# Patient Record
Sex: Male | Born: 1935 | Race: White | Hispanic: No | Marital: Married | Smoking: Former smoker
Health system: Southern US, Community
[De-identification: ages and names within clinical notes are randomized; demographics above are authoritative.]

## PROBLEM LIST (undated history)

## (undated) DIAGNOSIS — Z86018 Personal history of other benign neoplasm: Secondary | ICD-10-CM

## (undated) DIAGNOSIS — H9190 Unspecified hearing loss, unspecified ear: Secondary | ICD-10-CM

## (undated) DIAGNOSIS — N400 Enlarged prostate without lower urinary tract symptoms: Secondary | ICD-10-CM

## (undated) DIAGNOSIS — Z85820 Personal history of malignant melanoma of skin: Secondary | ICD-10-CM

## (undated) DIAGNOSIS — E785 Hyperlipidemia, unspecified: Secondary | ICD-10-CM

## (undated) DIAGNOSIS — M1990 Unspecified osteoarthritis, unspecified site: Secondary | ICD-10-CM

## (undated) DIAGNOSIS — T7840XA Allergy, unspecified, initial encounter: Secondary | ICD-10-CM

## (undated) DIAGNOSIS — Z85828 Personal history of other malignant neoplasm of skin: Secondary | ICD-10-CM

## (undated) DIAGNOSIS — I10 Essential (primary) hypertension: Secondary | ICD-10-CM

## (undated) DIAGNOSIS — R011 Cardiac murmur, unspecified: Secondary | ICD-10-CM

## (undated) HISTORY — PX: PROSTATE SURGERY: SHX751

## (undated) HISTORY — DX: Benign prostatic hyperplasia without lower urinary tract symptoms: N40.0

## (undated) HISTORY — PX: EYE SURGERY: SHX253

## (undated) HISTORY — DX: Personal history of other benign neoplasm: Z86.018

## (undated) HISTORY — PX: CHOLECYSTECTOMY: SHX55

## (undated) HISTORY — PX: APPENDECTOMY: SHX54

## (undated) HISTORY — PX: VASECTOMY: SHX75

## (undated) HISTORY — DX: Allergy, unspecified, initial encounter: T78.40XA

## (undated) HISTORY — PX: FRACTURE SURGERY: SHX138

## (undated) HISTORY — DX: Personal history of other malignant neoplasm of skin: Z85.828

## (undated) HISTORY — PX: HERNIA REPAIR: SHX51

## (undated) HISTORY — PX: OTHER SURGICAL HISTORY: SHX169

## (undated) HISTORY — DX: Personal history of malignant melanoma of skin: Z85.820

---

## 1994-10-22 HISTORY — PX: OTHER SURGICAL HISTORY: SHX169

## 2004-10-26 ENCOUNTER — Ambulatory Visit: Payer: Self-pay | Admitting: Internal Medicine

## 2012-04-17 ENCOUNTER — Ambulatory Visit: Payer: Self-pay | Admitting: Ophthalmology

## 2012-04-30 ENCOUNTER — Ambulatory Visit: Payer: Self-pay | Admitting: Ophthalmology

## 2013-01-14 DIAGNOSIS — Z85820 Personal history of malignant melanoma of skin: Secondary | ICD-10-CM

## 2013-01-14 DIAGNOSIS — C439 Malignant melanoma of skin, unspecified: Secondary | ICD-10-CM

## 2013-01-14 HISTORY — DX: Malignant melanoma of skin, unspecified: C43.9

## 2013-01-14 HISTORY — DX: Personal history of malignant melanoma of skin: Z85.820

## 2013-02-18 DIAGNOSIS — Z85828 Personal history of other malignant neoplasm of skin: Secondary | ICD-10-CM

## 2013-02-18 HISTORY — DX: Personal history of other malignant neoplasm of skin: Z85.828

## 2013-02-25 DIAGNOSIS — C4491 Basal cell carcinoma of skin, unspecified: Secondary | ICD-10-CM

## 2013-02-25 HISTORY — DX: Basal cell carcinoma of skin, unspecified: C44.91

## 2013-09-14 DIAGNOSIS — D239 Other benign neoplasm of skin, unspecified: Secondary | ICD-10-CM

## 2013-09-14 HISTORY — DX: Other benign neoplasm of skin, unspecified: D23.9

## 2014-12-22 DIAGNOSIS — L578 Other skin changes due to chronic exposure to nonionizing radiation: Secondary | ICD-10-CM | POA: Diagnosis not present

## 2014-12-22 DIAGNOSIS — D229 Melanocytic nevi, unspecified: Secondary | ICD-10-CM | POA: Diagnosis not present

## 2014-12-22 DIAGNOSIS — L821 Other seborrheic keratosis: Secondary | ICD-10-CM | POA: Diagnosis not present

## 2014-12-22 DIAGNOSIS — L570 Actinic keratosis: Secondary | ICD-10-CM | POA: Diagnosis not present

## 2014-12-22 DIAGNOSIS — L720 Epidermal cyst: Secondary | ICD-10-CM | POA: Diagnosis not present

## 2014-12-22 DIAGNOSIS — L814 Other melanin hyperpigmentation: Secondary | ICD-10-CM | POA: Diagnosis not present

## 2014-12-22 DIAGNOSIS — D1800 Hemangioma unspecified site: Secondary | ICD-10-CM | POA: Diagnosis not present

## 2015-02-13 NOTE — Op Note (Signed)
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  PROCEDURE:  Phacoemulsification with posterior chamber intraocular lens placement which required pupil stretching with the Graether pupil expansion device.   LENS IMPLANT:  ZCB00 22.5-diopter.   ULTRASOUND TIME:   11% of 2 minute and 12 seconds for CDE of 14.6.  SURGEON:  Mali Sotiria Keast, MD  ANESTHESIA:  Retrobulbar block of Xylocaine and Bupivacaine and Vitrase.   COMPLICATIONS:  None.  DESCRIPTION OF PROCEDURE:  The patient was identified in the holding room, transported to the operating room, and placed in the supine position.  The left eye was identified as the operative eye and a retrobulbar block was administered under intravenous sedation.  It was then prepped and draped in the usual sterile ophthalmic fashion.  The eye was inspected and the pupil was noted to be between 4 millimeters with maximal pharmacologic dilation.  A 1 millimeter side-port incision was made at the 1:30 position.  The anterior chamber was filled with Viscoat.  A 2.4 millimeter near-clear corneal incision was made at the 10:30 position.  A Graether pupil expander was then placed through the main incision and into the anterior chamber of the eye.  The edge of the iris was secured on the lip of the pupil expander and it was released, thereby expanding the pupil to approximately 7 millimeters for completion of the cataract surgery.  A cystotome and capsulorrhexis forceps were used to make a curvilinear capsulorrhexis.   Balanced salt solution was used to hydrodissect and hydrodelineate the lens nucleus.  Phacoemulsification was used in stop and chop fashion to remove the lens, nucleus and epinucleus.  The remaining cortex was aspirated using the irrigation aspiration  handpiece.  Additional Provisc was placed into the eye to distend the capsular bag for lens placement.  A ZCB00 22.5-diopter lens was then injected into the capsular bag.  The remaining viscoelastic was aspirated from the capsular bag and the anterior chamber. The pupil expanding ring was removed using a Kuglen hook.  The anterior chamber was filled with balanced salt solution to inflate to a physiologic pressure.    The wounds were hydrated with balanced salt solution.  The eye was noted to have a physiologic pressure without wound leak.  Topical Vigamox drops and Maxitrol ointment were applied to the eye.  The eye was shielded.   The patient was taken to the recovery room in stable condition.  ____________________________ Wyonia Hough, MD crb:slb D: 04/30/2012 14:05:52 ET T: 04/30/2012 14:31:01 ET JOB#: [id number]  cc: Wyonia Hough, MD, <Dictator> Leandrew Koyanagi MD ELECTRONICALLY SIGNED 04/30/2012 16:30

## 2015-02-16 DIAGNOSIS — H903 Sensorineural hearing loss, bilateral: Secondary | ICD-10-CM | POA: Diagnosis not present

## 2015-03-25 DIAGNOSIS — Z0000 Encounter for general adult medical examination without abnormal findings: Secondary | ICD-10-CM | POA: Diagnosis not present

## 2015-03-25 DIAGNOSIS — E784 Other hyperlipidemia: Secondary | ICD-10-CM | POA: Diagnosis not present

## 2015-05-16 DIAGNOSIS — E784 Other hyperlipidemia: Secondary | ICD-10-CM | POA: Diagnosis not present

## 2015-05-17 DIAGNOSIS — I10 Essential (primary) hypertension: Secondary | ICD-10-CM | POA: Diagnosis not present

## 2015-05-17 DIAGNOSIS — R972 Elevated prostate specific antigen [PSA]: Secondary | ICD-10-CM | POA: Diagnosis not present

## 2015-05-17 DIAGNOSIS — E784 Other hyperlipidemia: Secondary | ICD-10-CM | POA: Diagnosis not present

## 2015-05-25 DIAGNOSIS — Z0000 Encounter for general adult medical examination without abnormal findings: Secondary | ICD-10-CM | POA: Diagnosis not present

## 2015-06-17 DIAGNOSIS — E785 Hyperlipidemia, unspecified: Secondary | ICD-10-CM | POA: Insufficient documentation

## 2015-06-17 DIAGNOSIS — Z1211 Encounter for screening for malignant neoplasm of colon: Secondary | ICD-10-CM | POA: Diagnosis not present

## 2015-06-17 DIAGNOSIS — I10 Essential (primary) hypertension: Secondary | ICD-10-CM | POA: Insufficient documentation

## 2015-06-22 DIAGNOSIS — L821 Other seborrheic keratosis: Secondary | ICD-10-CM | POA: Diagnosis not present

## 2015-06-22 DIAGNOSIS — D229 Melanocytic nevi, unspecified: Secondary | ICD-10-CM | POA: Diagnosis not present

## 2015-06-22 DIAGNOSIS — Z1283 Encounter for screening for malignant neoplasm of skin: Secondary | ICD-10-CM | POA: Diagnosis not present

## 2015-06-22 DIAGNOSIS — L570 Actinic keratosis: Secondary | ICD-10-CM | POA: Diagnosis not present

## 2015-06-22 DIAGNOSIS — Z85828 Personal history of other malignant neoplasm of skin: Secondary | ICD-10-CM | POA: Diagnosis not present

## 2015-06-22 DIAGNOSIS — L720 Epidermal cyst: Secondary | ICD-10-CM | POA: Diagnosis not present

## 2015-06-22 DIAGNOSIS — Z85820 Personal history of malignant melanoma of skin: Secondary | ICD-10-CM | POA: Diagnosis not present

## 2015-06-22 DIAGNOSIS — D485 Neoplasm of uncertain behavior of skin: Secondary | ICD-10-CM | POA: Diagnosis not present

## 2015-07-15 ENCOUNTER — Ambulatory Visit: Payer: Commercial Managed Care - HMO | Admitting: Anesthesiology

## 2015-07-15 ENCOUNTER — Ambulatory Visit
Admission: RE | Admit: 2015-07-15 | Discharge: 2015-07-15 | Disposition: A | Discharge status: Home / Self Care | Payer: Commercial Managed Care - HMO | Source: Ambulatory Visit | Attending: Gastroenterology | Admitting: Gastroenterology

## 2015-07-15 ENCOUNTER — Encounter: Payer: Self-pay | Admitting: Anesthesiology

## 2015-07-15 ENCOUNTER — Encounter
Admission: RE | Disposition: A | Discharge status: Home / Self Care | Payer: Self-pay | Source: Ambulatory Visit | Attending: Gastroenterology

## 2015-07-15 DIAGNOSIS — K640 First degree hemorrhoids: Secondary | ICD-10-CM | POA: Insufficient documentation

## 2015-07-15 DIAGNOSIS — K635 Polyp of colon: Secondary | ICD-10-CM | POA: Diagnosis not present

## 2015-07-15 DIAGNOSIS — Z87891 Personal history of nicotine dependence: Secondary | ICD-10-CM | POA: Insufficient documentation

## 2015-07-15 DIAGNOSIS — D120 Benign neoplasm of cecum: Secondary | ICD-10-CM | POA: Diagnosis not present

## 2015-07-15 DIAGNOSIS — D125 Benign neoplasm of sigmoid colon: Secondary | ICD-10-CM | POA: Diagnosis not present

## 2015-07-15 DIAGNOSIS — I10 Essential (primary) hypertension: Secondary | ICD-10-CM | POA: Insufficient documentation

## 2015-07-15 DIAGNOSIS — Z79899 Other long term (current) drug therapy: Secondary | ICD-10-CM | POA: Insufficient documentation

## 2015-07-15 DIAGNOSIS — K5730 Diverticulosis of large intestine without perforation or abscess without bleeding: Secondary | ICD-10-CM | POA: Diagnosis not present

## 2015-07-15 DIAGNOSIS — E785 Hyperlipidemia, unspecified: Secondary | ICD-10-CM | POA: Insufficient documentation

## 2015-07-15 DIAGNOSIS — Z86010 Personal history of colonic polyps: Secondary | ICD-10-CM | POA: Diagnosis not present

## 2015-07-15 DIAGNOSIS — K5790 Diverticulosis of intestine, part unspecified, without perforation or abscess without bleeding: Secondary | ICD-10-CM | POA: Diagnosis not present

## 2015-07-15 DIAGNOSIS — D122 Benign neoplasm of ascending colon: Secondary | ICD-10-CM | POA: Diagnosis not present

## 2015-07-15 DIAGNOSIS — Z1211 Encounter for screening for malignant neoplasm of colon: Secondary | ICD-10-CM | POA: Diagnosis not present

## 2015-07-15 HISTORY — PX: COLONOSCOPY WITH PROPOFOL: SHX5780

## 2015-07-15 HISTORY — DX: Hyperlipidemia, unspecified: E78.5

## 2015-07-15 HISTORY — DX: Essential (primary) hypertension: I10

## 2015-07-15 SURGERY — COLONOSCOPY WITH PROPOFOL
Anesthesia: General

## 2015-07-15 MED ORDER — PROPOFOL 10 MG/ML IV BOLUS
INTRAVENOUS | Status: DC | PRN
  Administered 2015-07-15 (×2): 30 mg via INTRAVENOUS
  Administered 2015-07-15 (×2): 20 mg via INTRAVENOUS

## 2015-07-15 MED ORDER — SODIUM CHLORIDE 0.9 % IV SOLN: INTRAVENOUS | Status: DC

## 2015-07-15 MED ORDER — PROPOFOL 500 MG/50ML IV EMUL
INTRAVENOUS | Status: DC | PRN
  Administered 2015-07-15: 100 ug/kg/min via INTRAVENOUS

## 2015-07-15 MED ORDER — SODIUM CHLORIDE 0.9 % IV SOLN
INTRAVENOUS | Status: DC
  Administered 2015-07-15: 1000 mL via INTRAVENOUS

## 2015-07-15 MED ORDER — LIDOCAINE HCL (CARDIAC) 20 MG/ML IV SOLN
INTRAVENOUS | Status: DC | PRN
  Administered 2015-07-15: 60 mg via INTRAVENOUS

## 2015-07-15 NOTE — H&P (Signed)
  Primary Care Physician:  Cletis Athens, MD  Pre-Procedure History & Physical: HPI:  Mathew Martinez is a 81 y.o. male is here for an colonoscopy.   Past Medical History  Diagnosis Date  . Hypertension   . Hyperlipemia     Past Surgical History  Procedure Laterality Date  . Appendectomy    . Cholecystectomy    . Hernia repair    . Duodenojejunostomy      Prior to Admission medications   Medication Sig Start Date End Date Taking? Authorizing Provider  ferrous sulfate 325 (65 FE) MG tablet Take 325 mg by mouth daily with breakfast.   Yes Historical Provider, MD  lisinopril (PRINIVIL,ZESTRIL) 40 MG tablet Take 40 mg by mouth daily.   Yes Historical Provider, MD  lovastatin (MEVACOR) 20 MG tablet Take 20 mg by mouth at bedtime.   Yes Historical Provider, MD  metoprolol succinate (TOPROL-XL) 25 MG 24 hr tablet Take 25 mg by mouth daily.   Yes Historical Provider, MD  Multiple Vitamins-Minerals (MULTIVITAMIN WITH MINERALS) tablet Take 1 tablet by mouth daily.   Yes Historical Provider, MD    Allergies as of 06/20/2015  . (Not on File)    History reviewed. No pertinent family history.  Social History   Social History  . Marital Status: Married    Spouse Name: N/A  . Number of Children: N/A  . Years of Education: N/A   Occupational History  . Not on file.   Social History Main Topics  . Smoking status: Former Research scientist (life sciences)  . Smokeless tobacco: Not on file  . Alcohol Use: Not on file  . Drug Use: Not on file  . Sexual Activity: Not on file   Other Topics Concern  . Not on file   Social History Narrative     Physical Exam: BP 115/49 mmHg  Pulse 66  Temp(Src) 96.9 F (36.1 C) (Tympanic)  Resp 18  Ht 5\' 9"  (1.753 m)  Wt 63.504 kg (140 lb)  BMI 20.67 kg/m2  SpO2 97% General:   Alert,  pleasant and cooperative in NAD Head:  Normocephalic and atraumatic. Neck:  Supple; no masses or thyromegaly. Lungs:  Clear throughout to auscultation.    Heart:  Regular rate and  rhythm. Abdomen:  Soft, nontender and nondistended. Normal bowel sounds, without guarding, and without rebound.   Neurologic:  Alert and  oriented x4;  grossly normal neurologically.  Impression/Plan: Mathew Martinez is here for an colonoscopy to be performed for  surveillance  Risks, benefits, limitations, and alternatives regarding  colonoscopy have been reviewed with the patient.  Questions have been answered.  All parties agreeable.   Josefine Class, MD  07/15/2015, 9:34 AM

## 2015-07-15 NOTE — Discharge Instructions (Signed)

## 2015-07-15 NOTE — Transfer of Care (Signed)
Immediate Anesthesia Transfer of Care Note  Patient: Mathew Martinez  Procedure(s) Performed: Procedure(s): COLONOSCOPY WITH PROPOFOL (N/A)  Patient Location: PACU  Anesthesia Type:MAC  Level of Consciousness: awake  Airway & Oxygen Therapy: Patient Spontanous Breathing  Post-op Assessment: Report given to RN  Post vital signs: Reviewed  Last Vitals:  Filed Vitals:   07/15/15 1018  BP: 101/65  Pulse: 70  Temp: 36.2 C  Resp: 16    Complications: No apparent anesthesia complications

## 2015-07-15 NOTE — Anesthesia Preprocedure Evaluation (Signed)
Anesthesia Evaluation  Patient identified by MRN, date of birth, ID band Patient awake    Reviewed: Allergy & Precautions, H&P , NPO status , Patient's Chart, lab work & pertinent test results  Airway Mallampati: III  TM Distance: >3 FB Neck ROM: limited    Dental no notable dental hx. (+) Teeth Intact   Pulmonary former smoker,    Pulmonary exam normal breath sounds clear to auscultation       Cardiovascular Exercise Tolerance: Good hypertension, (-) Past MI Normal cardiovascular exam Rhythm:regular Rate:Normal     Neuro/Psych negative neurological ROS  negative psych ROS   GI/Hepatic negative GI ROS, Neg liver ROS, neg GERD  ,  Endo/Other  negative endocrine ROS  Renal/GU negative Renal ROS  negative genitourinary   Musculoskeletal   Abdominal   Peds  Hematology negative hematology ROS (+)   Anesthesia Other Findings Past Medical History:   Hypertension                                                 Hyperlipemia                                                 Signs and symptoms suggestive of sleep apnea    Reproductive/Obstetrics negative OB ROS                             Anesthesia Physical Anesthesia Plan  ASA: III  Anesthesia Plan: General   Post-op Pain Management:    Induction:   Airway Management Planned:   Additional Equipment:   Intra-op Plan:   Post-operative Plan:   Informed Consent: I have reviewed the patients History and Physical, chart, labs and discussed the procedure including the risks, benefits and alternatives for the proposed anesthesia with the patient or authorized representative who has indicated his/her understanding and acceptance.   Dental Advisory Given  Plan Discussed with: Anesthesiologist, CRNA and Surgeon  Anesthesia Plan Comments:         Anesthesia Quick Evaluation

## 2015-07-15 NOTE — Op Note (Signed)
[facility] Patient Name: Mathew Martinez Procedure Date: 07/15/2015 9:35 AM MRN: [id number] Account #: [id number] Date of Birth: [birth year] Admit Type: Outpatient Age: 81 Room: [facility] ENDO ROOM 1 Gender: Male Note Status: Finalized Procedure:         Colonoscopy Indications:       High risk colon cancer surveillance: Personal history of                     multiple (3 or more) adenomas, Last colonoscopy: 2011 Patient Profile:   This is an 81 year old male. Providers:         Gerrit Heck. Rayann Heman, MD Referring MD:      Cletis Athens, MD (Referring MD) Medicines:         Propofol per Anesthesia Complications:     No immediate complications. Procedure:         Pre-Anesthesia Assessment:                    - Prior to the procedure, a History and Physical was                     performed, and patient medications, allergies and                     sensitivities were reviewed. The patient's tolerance of                     previous anesthesia was reviewed.                    After obtaining informed consent, the colonoscope was                     passed under direct vision. Throughout the procedure, the                     patient's blood pressure, pulse, and oxygen saturations                     were monitored continuously. The Colonoscope was                     introduced through the anus and advanced to the the cecum,                     identified by appendiceal orifice and ileocecal valve. The                     colonoscopy was somewhat difficult due to multiple                     diverticula in the colon. The patient tolerated the                     procedure well. The quality of the bowel preparation was                     good. Findings:      The perianal and digital rectal examinations were normal.      A 7 mm polyp was found in the ascending colon. The polyp was flat. The       polyp was removed with a hot snare. Resection and retrieval were        complete.      A 6  mm polyp was found at the ileocecal valve. The polyp was sessile.       The polyp was removed with a hot snare. Resection and retrieval were       complete.      A 8 mm polyp was found in the ascending colon. The polyp was sessile.       The polyp was removed with a hot snare. Resection and retrieval were       complete.      Many large-mouthed diverticula were found in the sigmoid colon.      A few large-mouthed diverticula were found in the transverse colon and       in the ascending colon.      Internal hemorrhoids were found during retroflexion. The hemorrhoids       were Grade I (internal hemorrhoids that do not prolapse).      The exam was otherwise without abnormality. Impression:        - One 7 mm polyp in the ascending colon. Resected and                     retrieved.                    - One 6 mm polyp at the ileocecal valve. Resected and                     retrieved.                    - One 8 mm polyp in the ascending colon. Resected and                     retrieved.                    - Diverticulosis in the sigmoid colon.                    - Diverticulosis in the transverse colon and in the                     ascending colon.                    - Internal hemorrhoids.                    - The examination was otherwise normal. Recommendation:    - Observe patient in GI recovery unit.                    - High fiber diet.                    - Continue present medications.                    - Await pathology results.                    - Consider no further colonoscopies given advanced age.                    - Return to referring physician.                    - The findings and recommendations were discussed with the  patient.                    - The findings and recommendations were discussed with the                     patient's family. Procedure Code(s): --- Professional ---                    [phone], Colonoscopy,  flexible; with removal of tumor(s),                     polyp(s), or other lesion(s) by snare technique CPT copyright 2014 American Medical Association. All rights reserved. The codes documented in this report are preliminary and upon coder review may  be revised to meet current compliance requirements. Mellody Life, MD 07/15/2015 10:15:25 AM This report has been signed electronically. Number of Addenda: 0 Note Initiated On: 07/15/2015 9:35 AM Scope Withdrawal Time: 0 hours 17 minutes 13 seconds  Total Procedure Duration: 0 hours 27 minutes 6 seconds       [facility]

## 2015-07-15 NOTE — Anesthesia Postprocedure Evaluation (Signed)
  Anesthesia Post-op Note  Patient: Mathew Martinez  Procedure(s) Performed: Procedure(s): COLONOSCOPY WITH PROPOFOL (N/A)  Anesthesia type:General  Patient location: PACU  Post pain: Pain level controlled  Post assessment: Post-op Vital signs reviewed, Patient's Cardiovascular Status Stable, Respiratory Function Stable, Patent Airway and No signs of Nausea or vomiting  Post vital signs: Reviewed and stable  Last Vitals:  Filed Vitals:   07/15/15 1050  BP: 112/56  Pulse: 67  Temp:   Resp: 15    Level of consciousness: awake, alert  and patient cooperative  Complications: No apparent anesthesia complications

## 2015-07-17 ENCOUNTER — Encounter: Payer: Self-pay | Admitting: Gastroenterology

## 2015-07-18 LAB — SURGICAL PATHOLOGY

## 2015-08-02 DIAGNOSIS — D485 Neoplasm of uncertain behavior of skin: Secondary | ICD-10-CM | POA: Diagnosis not present

## 2015-08-02 DIAGNOSIS — L720 Epidermal cyst: Secondary | ICD-10-CM | POA: Diagnosis not present

## 2015-08-26 DIAGNOSIS — I10 Essential (primary) hypertension: Secondary | ICD-10-CM | POA: Diagnosis not present

## 2015-08-26 DIAGNOSIS — Z23 Encounter for immunization: Secondary | ICD-10-CM | POA: Diagnosis not present

## 2015-08-26 DIAGNOSIS — R69 Illness, unspecified: Secondary | ICD-10-CM | POA: Diagnosis not present

## 2015-08-26 DIAGNOSIS — L578 Other skin changes due to chronic exposure to nonionizing radiation: Secondary | ICD-10-CM | POA: Diagnosis not present

## 2015-08-26 DIAGNOSIS — E784 Other hyperlipidemia: Secondary | ICD-10-CM | POA: Diagnosis not present

## 2015-08-26 DIAGNOSIS — I059 Rheumatic mitral valve disease, unspecified: Secondary | ICD-10-CM | POA: Diagnosis not present

## 2015-11-25 DIAGNOSIS — H2511 Age-related nuclear cataract, right eye: Secondary | ICD-10-CM | POA: Diagnosis not present

## 2016-01-03 DIAGNOSIS — E784 Other hyperlipidemia: Secondary | ICD-10-CM | POA: Diagnosis not present

## 2016-01-03 DIAGNOSIS — A493 Mycoplasma infection, unspecified site: Secondary | ICD-10-CM | POA: Diagnosis not present

## 2016-01-03 DIAGNOSIS — J40 Bronchitis, not specified as acute or chronic: Secondary | ICD-10-CM | POA: Diagnosis not present

## 2016-01-03 DIAGNOSIS — I059 Rheumatic mitral valve disease, unspecified: Secondary | ICD-10-CM | POA: Diagnosis not present

## 2016-01-10 DIAGNOSIS — J218 Acute bronchiolitis due to other specified organisms: Secondary | ICD-10-CM | POA: Diagnosis not present

## 2016-01-10 DIAGNOSIS — I059 Rheumatic mitral valve disease, unspecified: Secondary | ICD-10-CM | POA: Diagnosis not present

## 2016-01-10 DIAGNOSIS — E784 Other hyperlipidemia: Secondary | ICD-10-CM | POA: Diagnosis not present

## 2016-01-10 DIAGNOSIS — B9789 Other viral agents as the cause of diseases classified elsewhere: Secondary | ICD-10-CM | POA: Diagnosis not present

## 2016-02-02 DIAGNOSIS — L820 Inflamed seborrheic keratosis: Secondary | ICD-10-CM | POA: Diagnosis not present

## 2016-02-02 DIAGNOSIS — Z85820 Personal history of malignant melanoma of skin: Secondary | ICD-10-CM | POA: Diagnosis not present

## 2016-02-02 DIAGNOSIS — D485 Neoplasm of uncertain behavior of skin: Secondary | ICD-10-CM | POA: Diagnosis not present

## 2016-02-02 DIAGNOSIS — L821 Other seborrheic keratosis: Secondary | ICD-10-CM | POA: Diagnosis not present

## 2016-02-02 DIAGNOSIS — D1800 Hemangioma unspecified site: Secondary | ICD-10-CM | POA: Diagnosis not present

## 2016-02-02 DIAGNOSIS — Z1283 Encounter for screening for malignant neoplasm of skin: Secondary | ICD-10-CM | POA: Diagnosis not present

## 2016-02-02 DIAGNOSIS — D229 Melanocytic nevi, unspecified: Secondary | ICD-10-CM | POA: Diagnosis not present

## 2016-02-02 DIAGNOSIS — L812 Freckles: Secondary | ICD-10-CM | POA: Diagnosis not present

## 2016-02-02 DIAGNOSIS — Z85828 Personal history of other malignant neoplasm of skin: Secondary | ICD-10-CM | POA: Diagnosis not present

## 2016-02-09 DIAGNOSIS — I10 Essential (primary) hypertension: Secondary | ICD-10-CM | POA: Diagnosis not present

## 2016-02-09 DIAGNOSIS — I059 Rheumatic mitral valve disease, unspecified: Secondary | ICD-10-CM | POA: Diagnosis not present

## 2016-02-09 DIAGNOSIS — E784 Other hyperlipidemia: Secondary | ICD-10-CM | POA: Diagnosis not present

## 2016-02-09 DIAGNOSIS — K5732 Diverticulitis of large intestine without perforation or abscess without bleeding: Secondary | ICD-10-CM | POA: Diagnosis not present

## 2016-05-18 DIAGNOSIS — I10 Essential (primary) hypertension: Secondary | ICD-10-CM | POA: Diagnosis not present

## 2016-05-18 DIAGNOSIS — Z125 Encounter for screening for malignant neoplasm of prostate: Secondary | ICD-10-CM | POA: Diagnosis not present

## 2016-05-18 DIAGNOSIS — E784 Other hyperlipidemia: Secondary | ICD-10-CM | POA: Diagnosis not present

## 2016-05-18 DIAGNOSIS — R5381 Other malaise: Secondary | ICD-10-CM | POA: Diagnosis not present

## 2016-05-25 DIAGNOSIS — I059 Rheumatic mitral valve disease, unspecified: Secondary | ICD-10-CM | POA: Diagnosis not present

## 2016-05-25 DIAGNOSIS — E784 Other hyperlipidemia: Secondary | ICD-10-CM | POA: Diagnosis not present

## 2016-05-25 DIAGNOSIS — I10 Essential (primary) hypertension: Secondary | ICD-10-CM | POA: Diagnosis not present

## 2016-08-06 DIAGNOSIS — L812 Freckles: Secondary | ICD-10-CM | POA: Diagnosis not present

## 2016-08-06 DIAGNOSIS — D229 Melanocytic nevi, unspecified: Secondary | ICD-10-CM | POA: Diagnosis not present

## 2016-08-06 DIAGNOSIS — Z85820 Personal history of malignant melanoma of skin: Secondary | ICD-10-CM | POA: Diagnosis not present

## 2016-08-06 DIAGNOSIS — D485 Neoplasm of uncertain behavior of skin: Secondary | ICD-10-CM | POA: Diagnosis not present

## 2016-08-06 DIAGNOSIS — D1800 Hemangioma unspecified site: Secondary | ICD-10-CM | POA: Diagnosis not present

## 2016-08-06 DIAGNOSIS — L578 Other skin changes due to chronic exposure to nonionizing radiation: Secondary | ICD-10-CM | POA: Diagnosis not present

## 2016-08-06 DIAGNOSIS — L821 Other seborrheic keratosis: Secondary | ICD-10-CM | POA: Diagnosis not present

## 2016-08-06 DIAGNOSIS — Z1283 Encounter for screening for malignant neoplasm of skin: Secondary | ICD-10-CM | POA: Diagnosis not present

## 2016-08-06 DIAGNOSIS — Z85828 Personal history of other malignant neoplasm of skin: Secondary | ICD-10-CM | POA: Diagnosis not present

## 2016-08-24 DIAGNOSIS — K5732 Diverticulitis of large intestine without perforation or abscess without bleeding: Secondary | ICD-10-CM | POA: Diagnosis not present

## 2016-08-24 DIAGNOSIS — E784 Other hyperlipidemia: Secondary | ICD-10-CM | POA: Diagnosis not present

## 2016-08-24 DIAGNOSIS — I059 Rheumatic mitral valve disease, unspecified: Secondary | ICD-10-CM | POA: Diagnosis not present

## 2016-08-24 DIAGNOSIS — I10 Essential (primary) hypertension: Secondary | ICD-10-CM | POA: Diagnosis not present

## 2016-11-23 DIAGNOSIS — E784 Other hyperlipidemia: Secondary | ICD-10-CM | POA: Diagnosis not present

## 2016-11-23 DIAGNOSIS — K141 Geographic tongue: Secondary | ICD-10-CM | POA: Diagnosis not present

## 2016-11-23 DIAGNOSIS — I059 Rheumatic mitral valve disease, unspecified: Secondary | ICD-10-CM | POA: Diagnosis not present

## 2016-11-23 DIAGNOSIS — I10 Essential (primary) hypertension: Secondary | ICD-10-CM | POA: Diagnosis not present

## 2016-12-31 DIAGNOSIS — L578 Other skin changes due to chronic exposure to nonionizing radiation: Secondary | ICD-10-CM | POA: Diagnosis not present

## 2016-12-31 DIAGNOSIS — L821 Other seborrheic keratosis: Secondary | ICD-10-CM | POA: Diagnosis not present

## 2016-12-31 DIAGNOSIS — Z1283 Encounter for screening for malignant neoplasm of skin: Secondary | ICD-10-CM | POA: Diagnosis not present

## 2016-12-31 DIAGNOSIS — D1800 Hemangioma unspecified site: Secondary | ICD-10-CM | POA: Diagnosis not present

## 2016-12-31 DIAGNOSIS — Z85828 Personal history of other malignant neoplasm of skin: Secondary | ICD-10-CM | POA: Diagnosis not present

## 2016-12-31 DIAGNOSIS — L812 Freckles: Secondary | ICD-10-CM | POA: Diagnosis not present

## 2016-12-31 DIAGNOSIS — D485 Neoplasm of uncertain behavior of skin: Secondary | ICD-10-CM | POA: Diagnosis not present

## 2016-12-31 DIAGNOSIS — D229 Melanocytic nevi, unspecified: Secondary | ICD-10-CM | POA: Diagnosis not present

## 2016-12-31 DIAGNOSIS — Z85820 Personal history of malignant melanoma of skin: Secondary | ICD-10-CM | POA: Diagnosis not present

## 2017-05-21 DIAGNOSIS — R5381 Other malaise: Secondary | ICD-10-CM | POA: Diagnosis not present

## 2017-05-21 DIAGNOSIS — I10 Essential (primary) hypertension: Secondary | ICD-10-CM | POA: Diagnosis not present

## 2017-05-21 DIAGNOSIS — E784 Other hyperlipidemia: Secondary | ICD-10-CM | POA: Diagnosis not present

## 2017-05-21 DIAGNOSIS — Z125 Encounter for screening for malignant neoplasm of prostate: Secondary | ICD-10-CM | POA: Diagnosis not present

## 2017-05-24 DIAGNOSIS — Z0000 Encounter for general adult medical examination without abnormal findings: Secondary | ICD-10-CM | POA: Diagnosis not present

## 2017-07-03 DIAGNOSIS — D229 Melanocytic nevi, unspecified: Secondary | ICD-10-CM | POA: Diagnosis not present

## 2017-07-03 DIAGNOSIS — L812 Freckles: Secondary | ICD-10-CM | POA: Diagnosis not present

## 2017-07-03 DIAGNOSIS — Z85820 Personal history of malignant melanoma of skin: Secondary | ICD-10-CM | POA: Diagnosis not present

## 2017-07-03 DIAGNOSIS — D1800 Hemangioma unspecified site: Secondary | ICD-10-CM | POA: Diagnosis not present

## 2017-07-03 DIAGNOSIS — D485 Neoplasm of uncertain behavior of skin: Secondary | ICD-10-CM | POA: Diagnosis not present

## 2017-07-03 DIAGNOSIS — Z85828 Personal history of other malignant neoplasm of skin: Secondary | ICD-10-CM | POA: Diagnosis not present

## 2017-07-03 DIAGNOSIS — L578 Other skin changes due to chronic exposure to nonionizing radiation: Secondary | ICD-10-CM | POA: Diagnosis not present

## 2017-07-03 DIAGNOSIS — L821 Other seborrheic keratosis: Secondary | ICD-10-CM | POA: Diagnosis not present

## 2017-07-08 DIAGNOSIS — H2511 Age-related nuclear cataract, right eye: Secondary | ICD-10-CM | POA: Diagnosis not present

## 2017-12-02 DIAGNOSIS — K141 Geographic tongue: Secondary | ICD-10-CM | POA: Diagnosis not present

## 2017-12-02 DIAGNOSIS — E785 Hyperlipidemia, unspecified: Secondary | ICD-10-CM | POA: Diagnosis not present

## 2017-12-02 DIAGNOSIS — I10 Essential (primary) hypertension: Secondary | ICD-10-CM | POA: Diagnosis not present

## 2017-12-02 DIAGNOSIS — J449 Chronic obstructive pulmonary disease, unspecified: Secondary | ICD-10-CM | POA: Diagnosis not present

## 2017-12-02 DIAGNOSIS — I059 Rheumatic mitral valve disease, unspecified: Secondary | ICD-10-CM | POA: Diagnosis not present

## 2018-01-06 DIAGNOSIS — H2512 Age-related nuclear cataract, left eye: Secondary | ICD-10-CM | POA: Diagnosis not present

## 2018-01-09 DIAGNOSIS — Z1283 Encounter for screening for malignant neoplasm of skin: Secondary | ICD-10-CM | POA: Diagnosis not present

## 2018-01-09 DIAGNOSIS — L821 Other seborrheic keratosis: Secondary | ICD-10-CM | POA: Diagnosis not present

## 2018-01-09 DIAGNOSIS — D485 Neoplasm of uncertain behavior of skin: Secondary | ICD-10-CM | POA: Diagnosis not present

## 2018-01-09 DIAGNOSIS — L820 Inflamed seborrheic keratosis: Secondary | ICD-10-CM | POA: Diagnosis not present

## 2018-01-09 DIAGNOSIS — Z85820 Personal history of malignant melanoma of skin: Secondary | ICD-10-CM | POA: Diagnosis not present

## 2018-01-09 DIAGNOSIS — D1800 Hemangioma unspecified site: Secondary | ICD-10-CM | POA: Diagnosis not present

## 2018-01-09 DIAGNOSIS — D225 Melanocytic nevi of trunk: Secondary | ICD-10-CM | POA: Diagnosis not present

## 2018-01-09 DIAGNOSIS — Z85828 Personal history of other malignant neoplasm of skin: Secondary | ICD-10-CM | POA: Diagnosis not present

## 2018-01-09 DIAGNOSIS — D2230 Melanocytic nevi of unspecified part of face: Secondary | ICD-10-CM | POA: Diagnosis not present

## 2018-02-13 DIAGNOSIS — N423 Unspecified dysplasia of prostate: Secondary | ICD-10-CM | POA: Diagnosis not present

## 2018-02-13 DIAGNOSIS — I059 Rheumatic mitral valve disease, unspecified: Secondary | ICD-10-CM | POA: Diagnosis not present

## 2018-02-13 DIAGNOSIS — M10271 Drug-induced gout, right ankle and foot: Secondary | ICD-10-CM | POA: Diagnosis not present

## 2018-02-17 DIAGNOSIS — I059 Rheumatic mitral valve disease, unspecified: Secondary | ICD-10-CM | POA: Diagnosis not present

## 2018-02-17 DIAGNOSIS — K141 Geographic tongue: Secondary | ICD-10-CM | POA: Diagnosis not present

## 2018-02-17 DIAGNOSIS — M10271 Drug-induced gout, right ankle and foot: Secondary | ICD-10-CM | POA: Diagnosis not present

## 2018-02-17 DIAGNOSIS — E785 Hyperlipidemia, unspecified: Secondary | ICD-10-CM | POA: Diagnosis not present

## 2018-03-05 DIAGNOSIS — H2511 Age-related nuclear cataract, right eye: Secondary | ICD-10-CM | POA: Diagnosis not present

## 2018-03-13 NOTE — Discharge Instructions (Signed)

## 2018-03-19 ENCOUNTER — Ambulatory Visit: Payer: Medicare HMO | Admitting: Anesthesiology

## 2018-03-19 ENCOUNTER — Ambulatory Visit
Admission: RE | Admit: 2018-03-19 | Discharge: 2018-03-19 | Disposition: A | Discharge status: Home / Self Care | Payer: Medicare HMO | Source: Ambulatory Visit | Attending: Ophthalmology | Admitting: Ophthalmology

## 2018-03-19 ENCOUNTER — Encounter
Admission: RE | Disposition: A | Discharge status: Home / Self Care | Payer: Self-pay | Source: Ambulatory Visit | Attending: Ophthalmology

## 2018-03-19 DIAGNOSIS — H2511 Age-related nuclear cataract, right eye: Secondary | ICD-10-CM | POA: Insufficient documentation

## 2018-03-19 DIAGNOSIS — H25811 Combined forms of age-related cataract, right eye: Secondary | ICD-10-CM | POA: Diagnosis not present

## 2018-03-19 DIAGNOSIS — I10 Essential (primary) hypertension: Secondary | ICD-10-CM | POA: Diagnosis not present

## 2018-03-19 DIAGNOSIS — Z85820 Personal history of malignant melanoma of skin: Secondary | ICD-10-CM | POA: Insufficient documentation

## 2018-03-19 DIAGNOSIS — Z87891 Personal history of nicotine dependence: Secondary | ICD-10-CM | POA: Insufficient documentation

## 2018-03-19 HISTORY — DX: Unspecified hearing loss, unspecified ear: H91.90

## 2018-03-19 HISTORY — DX: Unspecified osteoarthritis, unspecified site: M19.90

## 2018-03-19 HISTORY — PX: CATARACT EXTRACTION W/PHACO: SHX586

## 2018-03-19 HISTORY — DX: Cardiac murmur, unspecified: R01.1

## 2018-03-19 SURGERY — PHACOEMULSIFICATION, CATARACT, WITH IOL INSERTION
Anesthesia: Monitor Anesthesia Care | Laterality: Right | Wound class: "Clean "

## 2018-03-19 MED ORDER — EPINEPHRINE PF 1 MG/ML IJ SOLN
INTRAOCULAR | Status: DC | PRN
  Administered 2018-03-19: 65 mL via OPHTHALMIC

## 2018-03-19 MED ORDER — MIDAZOLAM HCL 2 MG/2ML IJ SOLN
INTRAMUSCULAR | Status: DC | PRN
  Administered 2018-03-19: 1.5 mg via INTRAVENOUS

## 2018-03-19 MED ORDER — ARMC OPHTHALMIC DILATING DROPS
1.0000 "application " | OPHTHALMIC | Status: DC | PRN
  Administered 2018-03-19 (×3): 1 via OPHTHALMIC

## 2018-03-19 MED ORDER — LACTATED RINGERS IV SOLN: INTRAVENOUS | Status: DC

## 2018-03-19 MED ORDER — NA HYALUR & NA CHOND-NA HYALUR 0.4-0.35 ML IO KIT
PACK | INTRAOCULAR | Status: DC | PRN
  Administered 2018-03-19: 1 mL via INTRAOCULAR

## 2018-03-19 MED ORDER — FENTANYL CITRATE (PF) 100 MCG/2ML IJ SOLN
INTRAMUSCULAR | Status: DC | PRN
  Administered 2018-03-19: 1 ug via INTRAVENOUS

## 2018-03-19 MED ORDER — BRIMONIDINE TARTRATE-TIMOLOL 0.2-0.5 % OP SOLN
OPHTHALMIC | Status: DC | PRN
  Administered 2018-03-19: 1 [drp] via OPHTHALMIC

## 2018-03-19 MED ORDER — MOXIFLOXACIN HCL 0.5 % OP SOLN
1.0000 [drp] | OPHTHALMIC | Status: DC | PRN
  Administered 2018-03-19 (×3): 1 [drp] via OPHTHALMIC

## 2018-03-19 MED ORDER — LIDOCAINE HCL (PF) 2 % IJ SOLN
INTRAMUSCULAR | Status: DC | PRN
  Administered 2018-03-19: 1 mL

## 2018-03-19 MED ORDER — CEFUROXIME OPHTHALMIC INJECTION 1 MG/0.1 ML
INJECTION | OPHTHALMIC | Status: DC | PRN
  Administered 2018-03-19: 0.1 mL via OPHTHALMIC

## 2018-03-19 SURGICAL SUPPLY — 27 items
CANNULA ANT/CHMB 27G (MISCELLANEOUS) ×1 IMPLANT
CANNULA ANT/CHMB 27GA (MISCELLANEOUS) ×3 IMPLANT
CARTRIDGE ABBOTT (MISCELLANEOUS) IMPLANT
GLOVE SURG LX 7.5 STRW (GLOVE) ×2
GLOVE SURG LX STRL 7.5 STRW (GLOVE) ×1 IMPLANT
GLOVE SURG TRIUMPH 8.0 PF LTX (GLOVE) ×3 IMPLANT
GOWN STRL REUS W/ TWL LRG LVL3 (GOWN DISPOSABLE) ×2 IMPLANT
GOWN STRL REUS W/TWL LRG LVL3 (GOWN DISPOSABLE) ×4
LENS IOL TECNIS ITEC 22.5 (Intraocular Lens) ×2 IMPLANT
MARKER SKIN DUAL TIP RULER LAB (MISCELLANEOUS) ×3 IMPLANT
NDL FILTER BLUNT 18X1 1/2 (NEEDLE) ×1 IMPLANT
NDL RETROBULBAR .5 NSTRL (NEEDLE) IMPLANT
NEEDLE FILTER BLUNT 18X 1/2SAF (NEEDLE) ×2
NEEDLE FILTER BLUNT 18X1 1/2 (NEEDLE) ×1 IMPLANT
PACK CATARACT BRASINGTON (MISCELLANEOUS) ×3 IMPLANT
PACK EYE AFTER SURG (MISCELLANEOUS) ×3 IMPLANT
PACK OPTHALMIC (MISCELLANEOUS) ×3 IMPLANT
RING MALYGIN 7.0 (MISCELLANEOUS) ×2 IMPLANT
SUT ETHILON 10-0 CS-B-6CS-B-6 (SUTURE)
SUT VICRYL  9 0 (SUTURE)
SUT VICRYL 9 0 (SUTURE) IMPLANT
SUTURE EHLN 10-0 CS-B-6CS-B-6 (SUTURE) IMPLANT
SYR 3ML LL SCALE MARK (SYRINGE) ×3 IMPLANT
SYR 5ML LL (SYRINGE) ×3 IMPLANT
SYR TB 1ML LUER SLIP (SYRINGE) ×3 IMPLANT
WATER STERILE IRR 500ML POUR (IV SOLUTION) ×3 IMPLANT
WIPE NON LINTING 3.25X3.25 (MISCELLANEOUS) ×3 IMPLANT

## 2018-03-19 NOTE — Anesthesia Postprocedure Evaluation (Signed)
Anesthesia Post Note  Patient: Mathew Martinez  Procedure(s) Performed: CATARACT EXTRACTION PHACO AND INTRAOCULAR LENS PLACEMENT (IOC) COMPLICATED RIGHT (Right )  Patient location during evaluation: PACU Anesthesia Type: MAC Level of consciousness: awake and alert Pain management: pain level controlled Vital Signs Assessment: post-procedure vital signs reviewed and stable Respiratory status: spontaneous breathing Cardiovascular status: blood pressure returned to baseline Postop Assessment: no headache Anesthetic complications: no    Jaci Standard, III,  Eddy Liszewski D

## 2018-03-19 NOTE — Anesthesia Procedure Notes (Signed)
Procedure Name: MAC Performed by: Kalijah Westfall, CRNA Pre-anesthesia Checklist: Patient identified, Emergency Drugs available, Suction available, Timeout performed and Patient being monitored Patient Re-evaluated:Patient Re-evaluated prior to induction Oxygen Delivery Method: Nasal cannula Placement Confirmation: positive ETCO2       

## 2018-03-19 NOTE — Transfer of Care (Signed)
Immediate Anesthesia Transfer of Care Note  Patient: Mathew Martinez  Procedure(s) Performed: CATARACT EXTRACTION PHACO AND INTRAOCULAR LENS PLACEMENT (IOC) COMPLICATED RIGHT (Right )  Patient Location: PACU  Anesthesia Type: MAC  Level of Consciousness: awake, alert  and patient cooperative  Airway and Oxygen Therapy: Patient Spontanous Breathing and Patient connected to supplemental oxygen  Post-op Assessment: Post-op Vital signs reviewed, Patient's Cardiovascular Status Stable, Respiratory Function Stable, Patent Airway and No signs of Nausea or vomiting  Post-op Vital Signs: Reviewed and stable  Complications: No apparent anesthesia complications

## 2018-03-19 NOTE — Op Note (Signed)
LOCATION:  [place]   PREOPERATIVE DIAGNOSIS:    Nuclear sclerotic cataract right eye. H25.11   POSTOPERATIVE DIAGNOSIS:  Nuclear sclerotic cataract right eye.     PROCEDURE:  Phacoemusification with posterior chamber intraocular lens placement of the right eye   LENS:  * No implants in log *   PCB00 22.5 D PCIOL   ULTRASOUND TIME: 16 % of 1 minutes, 29 seconds.  CDE 14.2   SURGEON:  Wyonia Hough, MD   ANESTHESIA:  Topical with tetracaine drops and 2% Xylocaine jelly, augmented with 1% preservative-free intracameral lidocaine.    COMPLICATIONS:  None.   DESCRIPTION OF PROCEDURE:  The patient was identified in the holding room and transported to the operating room and placed in the supine position under the operating microscope.  The right eye was identified as the operative eye and it was prepped and draped in the usual sterile ophthalmic fashion.   A 1 millimeter clear-corneal paracentesis was made at the 12:00 position.  0.5 ml of preservative-free 1% lidocaine was injected into the anterior chamber. The anterior chamber was filled with Viscoat viscoelastic.  A 2.4 millimeter keratome was used to make a near-clear corneal incision at the 9:00 position.  A curvilinear capsulorrhexis was made with a cystotome and capsulorrhexis forceps.  Balanced salt solution was used to hydrodissect and hydrodelineate the nucleus.   Phacoemulsification was then used in stop and chop fashion to remove the lens nucleus and epinucleus.  The remaining cortex was then removed using the irrigation and aspiration handpiece. Provisc was then placed into the capsular bag to distend it for lens placement.  A lens was then injected into the capsular bag.  The remaining viscoelastic was aspirated.   Wounds were hydrated with balanced salt solution.  The anterior chamber was inflated to a physiologic pressure with balanced salt solution.  No wound leaks were noted. Cefuroxime 0.1 ml of a 10mg /ml  solution was injected into the anterior chamber for a dose of 1 mg of intracameral antibiotic at the completion of the case.   Timolol and Brimonidine drops were applied to the eye.  The patient was taken to the recovery room in stable condition without complications of anesthesia or surgery.   Serin Thornell 03/19/2018, 10:09 AM

## 2018-03-19 NOTE — Anesthesia Preprocedure Evaluation (Signed)
Anesthesia Evaluation  Patient identified by MRN, date of birth, ID band Patient awake    Reviewed: Allergy & Precautions, H&P , NPO status , Patient's Chart, lab work & pertinent test results  Airway Mallampati: II  TM Distance: >3 FB Neck ROM: full    Dental no notable dental hx.    Pulmonary former smoker,    Pulmonary exam normal        Cardiovascular hypertension, Normal cardiovascular exam     Neuro/Psych    GI/Hepatic negative GI ROS, Neg liver ROS,   Endo/Other  negative endocrine ROS  Renal/GU negative Renal ROS     Musculoskeletal   Abdominal   Peds  Hematology negative hematology ROS (+)   Anesthesia Other Findings   Reproductive/Obstetrics negative OB ROS                             Anesthesia Physical Anesthesia Plan  ASA: II  Anesthesia Plan: MAC   Post-op Pain Management:    Induction:   PONV Risk Score and Plan:   Airway Management Planned:   Additional Equipment:   Intra-op Plan:   Post-operative Plan:   Informed Consent: I have reviewed the patients History and Physical, chart, labs and discussed the procedure including the risks, benefits and alternatives for the proposed anesthesia with the patient or authorized representative who has indicated his/her understanding and acceptance.     Plan Discussed with:   Anesthesia Plan Comments:         Anesthesia Quick Evaluation

## 2018-03-19 NOTE — H&P (Signed)
The History and Physical notes are on paper, have been signed, and are to be scanned. The patient remains stable and unchanged from the H&P.   Previous H&P reviewed, patient examined, and there are no changes.  Mathew Martinez 03/19/2018 9:18 AM

## 2018-03-21 ENCOUNTER — Encounter: Payer: Self-pay | Admitting: Ophthalmology

## 2018-05-26 DIAGNOSIS — E7849 Other hyperlipidemia: Secondary | ICD-10-CM | POA: Diagnosis not present

## 2018-05-26 DIAGNOSIS — R5381 Other malaise: Secondary | ICD-10-CM | POA: Diagnosis not present

## 2018-05-26 DIAGNOSIS — M1040 Other secondary gout, unspecified site: Secondary | ICD-10-CM | POA: Diagnosis not present

## 2018-05-26 DIAGNOSIS — Z125 Encounter for screening for malignant neoplasm of prostate: Secondary | ICD-10-CM | POA: Diagnosis not present

## 2018-05-26 DIAGNOSIS — I10 Essential (primary) hypertension: Secondary | ICD-10-CM | POA: Diagnosis not present

## 2018-05-28 DIAGNOSIS — Z961 Presence of intraocular lens: Secondary | ICD-10-CM | POA: Diagnosis not present

## 2018-06-02 DIAGNOSIS — Z0000 Encounter for general adult medical examination without abnormal findings: Secondary | ICD-10-CM | POA: Diagnosis not present

## 2018-06-02 DIAGNOSIS — I059 Rheumatic mitral valve disease, unspecified: Secondary | ICD-10-CM | POA: Diagnosis not present

## 2018-06-02 DIAGNOSIS — M10271 Drug-induced gout, right ankle and foot: Secondary | ICD-10-CM | POA: Diagnosis not present

## 2018-06-02 DIAGNOSIS — K141 Geographic tongue: Secondary | ICD-10-CM | POA: Diagnosis not present

## 2018-06-02 DIAGNOSIS — E785 Hyperlipidemia, unspecified: Secondary | ICD-10-CM | POA: Diagnosis not present

## 2018-08-06 DIAGNOSIS — Z1283 Encounter for screening for malignant neoplasm of skin: Secondary | ICD-10-CM | POA: Diagnosis not present

## 2018-08-06 DIAGNOSIS — L578 Other skin changes due to chronic exposure to nonionizing radiation: Secondary | ICD-10-CM | POA: Diagnosis not present

## 2018-08-06 DIAGNOSIS — D225 Melanocytic nevi of trunk: Secondary | ICD-10-CM | POA: Diagnosis not present

## 2018-08-06 DIAGNOSIS — L821 Other seborrheic keratosis: Secondary | ICD-10-CM | POA: Diagnosis not present

## 2018-08-06 DIAGNOSIS — Z85820 Personal history of malignant melanoma of skin: Secondary | ICD-10-CM | POA: Diagnosis not present

## 2018-08-06 DIAGNOSIS — D485 Neoplasm of uncertain behavior of skin: Secondary | ICD-10-CM | POA: Diagnosis not present

## 2018-08-06 DIAGNOSIS — Z85828 Personal history of other malignant neoplasm of skin: Secondary | ICD-10-CM | POA: Diagnosis not present

## 2018-08-06 DIAGNOSIS — L812 Freckles: Secondary | ICD-10-CM | POA: Diagnosis not present

## 2018-08-06 DIAGNOSIS — L820 Inflamed seborrheic keratosis: Secondary | ICD-10-CM | POA: Diagnosis not present

## 2018-09-09 DIAGNOSIS — K141 Geographic tongue: Secondary | ICD-10-CM | POA: Diagnosis not present

## 2018-09-09 DIAGNOSIS — M10271 Drug-induced gout, right ankle and foot: Secondary | ICD-10-CM | POA: Diagnosis not present

## 2018-09-09 DIAGNOSIS — I059 Rheumatic mitral valve disease, unspecified: Secondary | ICD-10-CM | POA: Diagnosis not present

## 2018-09-09 DIAGNOSIS — E785 Hyperlipidemia, unspecified: Secondary | ICD-10-CM | POA: Diagnosis not present

## 2018-10-10 DIAGNOSIS — L720 Epidermal cyst: Secondary | ICD-10-CM | POA: Diagnosis not present

## 2018-10-10 DIAGNOSIS — D229 Melanocytic nevi, unspecified: Secondary | ICD-10-CM | POA: Diagnosis not present

## 2018-11-14 DIAGNOSIS — H1859 Other hereditary corneal dystrophies: Secondary | ICD-10-CM | POA: Diagnosis not present

## 2018-12-05 DIAGNOSIS — E785 Hyperlipidemia, unspecified: Secondary | ICD-10-CM | POA: Diagnosis not present

## 2018-12-05 DIAGNOSIS — J449 Chronic obstructive pulmonary disease, unspecified: Secondary | ICD-10-CM | POA: Diagnosis not present

## 2018-12-05 DIAGNOSIS — I059 Rheumatic mitral valve disease, unspecified: Secondary | ICD-10-CM | POA: Diagnosis not present

## 2018-12-05 DIAGNOSIS — M10271 Drug-induced gout, right ankle and foot: Secondary | ICD-10-CM | POA: Diagnosis not present

## 2018-12-05 DIAGNOSIS — K141 Geographic tongue: Secondary | ICD-10-CM | POA: Diagnosis not present

## 2019-08-13 DIAGNOSIS — Z86018 Personal history of other benign neoplasm: Secondary | ICD-10-CM | POA: Diagnosis not present

## 2019-08-13 DIAGNOSIS — D225 Melanocytic nevi of trunk: Secondary | ICD-10-CM | POA: Diagnosis not present

## 2019-08-13 DIAGNOSIS — L814 Other melanin hyperpigmentation: Secondary | ICD-10-CM | POA: Diagnosis not present

## 2019-08-13 DIAGNOSIS — D485 Neoplasm of uncertain behavior of skin: Secondary | ICD-10-CM | POA: Diagnosis not present

## 2019-08-13 DIAGNOSIS — D1800 Hemangioma unspecified site: Secondary | ICD-10-CM | POA: Diagnosis not present

## 2019-08-13 DIAGNOSIS — D229 Melanocytic nevi, unspecified: Secondary | ICD-10-CM | POA: Diagnosis not present

## 2019-08-13 DIAGNOSIS — L821 Other seborrheic keratosis: Secondary | ICD-10-CM | POA: Diagnosis not present

## 2019-08-13 DIAGNOSIS — D2230 Melanocytic nevi of unspecified part of face: Secondary | ICD-10-CM | POA: Diagnosis not present

## 2019-08-13 DIAGNOSIS — D2272 Melanocytic nevi of left lower limb, including hip: Secondary | ICD-10-CM | POA: Diagnosis not present

## 2019-08-13 DIAGNOSIS — L578 Other skin changes due to chronic exposure to nonionizing radiation: Secondary | ICD-10-CM | POA: Diagnosis not present

## 2019-09-15 DIAGNOSIS — Z125 Encounter for screening for malignant neoplasm of prostate: Secondary | ICD-10-CM | POA: Diagnosis not present

## 2019-09-15 DIAGNOSIS — M1040 Other secondary gout, unspecified site: Secondary | ICD-10-CM | POA: Diagnosis not present

## 2019-09-15 DIAGNOSIS — E7849 Other hyperlipidemia: Secondary | ICD-10-CM | POA: Diagnosis not present

## 2019-09-15 DIAGNOSIS — I10 Essential (primary) hypertension: Secondary | ICD-10-CM | POA: Diagnosis not present

## 2019-09-15 DIAGNOSIS — R5381 Other malaise: Secondary | ICD-10-CM | POA: Diagnosis not present

## 2019-09-29 DIAGNOSIS — K141 Geographic tongue: Secondary | ICD-10-CM | POA: Diagnosis not present

## 2019-09-29 DIAGNOSIS — I059 Rheumatic mitral valve disease, unspecified: Secondary | ICD-10-CM | POA: Diagnosis not present

## 2019-09-29 DIAGNOSIS — E785 Hyperlipidemia, unspecified: Secondary | ICD-10-CM | POA: Diagnosis not present

## 2019-09-29 DIAGNOSIS — Z0000 Encounter for general adult medical examination without abnormal findings: Secondary | ICD-10-CM | POA: Diagnosis not present

## 2019-09-29 DIAGNOSIS — M10271 Drug-induced gout, right ankle and foot: Secondary | ICD-10-CM | POA: Diagnosis not present

## 2019-11-16 DIAGNOSIS — H04123 Dry eye syndrome of bilateral lacrimal glands: Secondary | ICD-10-CM | POA: Diagnosis not present

## 2019-11-26 ENCOUNTER — Encounter: Payer: Self-pay | Admitting: Urology

## 2019-11-26 ENCOUNTER — Other Ambulatory Visit: Payer: Self-pay

## 2019-11-26 ENCOUNTER — Ambulatory Visit: Payer: Medicare HMO | Admitting: Urology

## 2019-11-26 VITALS — BP 155/78 | HR 69 | Ht 69.0 in | Wt 140.0 lb

## 2019-11-26 DIAGNOSIS — R972 Elevated prostate specific antigen [PSA]: Secondary | ICD-10-CM | POA: Diagnosis not present

## 2019-11-26 NOTE — Progress Notes (Signed)
11/26/2019 10:54 AM   Mathew Martinez 10-30-1930 [id number]  Referring provider: Cletis Athens, MD [address] [place],  [place] [address]  Chief Complaint  Patient presents with  . Elevated PSA    HPI: Mathew Martinez is an 86 y.o. male seen at the request of Dr. Lavera Guise for evaluation of an elevated PSA.  A PSA drawn in November 2020 was elevated at 11.4.  No previous PSA results were sent.  The patient states he is a longstanding patient of Dr. Jennette Kettle and his PSA has been checked for several years.  He states the PSA typically fluctuates and typically runs between 4-7.  He states it has never been above 10.  He has a long history of an elevated PSA and had a prostate biopsy 25-30 years ago.  He apparently underwent an open simple prostatectomy and removal of bladder calculi and states after that surgery his PSA returned to the 1 range and has slowly increased over the last few decades.  Overall he has no bothersome lower urinary tract symptoms.  He does have some double voiding and notes his stream is slow in the morning.  Denies dysuria or gross hematuria.  No flank, abdominal or pelvic pain.   PMH: Past Medical History:  Diagnosis Date  . Arthritis    wrist  . Hard of hearing   . Heart murmur   . History of basal cell carcinoma   . History of dysplastic nevus   . History of melanoma 01/14/2013   left upper back  . Hyperlipemia   . Hypertension     Surgical History: Past Surgical History:  Procedure Laterality Date  . APPENDECTOMY    . CATARACT EXTRACTION W/PHACO Right 03/19/2018   Procedure: CATARACT EXTRACTION PHACO AND INTRAOCULAR LENS PLACEMENT ([place]) COMPLICATED RIGHT;  Surgeon: Leandrew Koyanagi, MD;  Location: [place] [facility];  Service: Ophthalmology;  Laterality: Right;  [place]    . COLONOSCOPY WITH PROPOFOL N/A 07/15/2015   Procedure: COLONOSCOPY WITH PROPOFOL;  Surgeon: Josefine Class, MD;  Location: [facility] ENDOSCOPY;  Service:  Endoscopy;  Laterality: N/A;  . Duodenojejunostomy    . HERNIA REPAIR      Home Medications:  Allergies as of 11/26/2019   No Known Allergies     Medication List       Accurate as of November 26, 2019 10:54 AM. If you have any questions, ask your nurse or doctor.        allopurinol 100 MG tablet Commonly known as: ZYLOPRIM Take 100 mg by mouth daily.   CALCIUM 1200 PO Take 600 mg by mouth.   co-enzyme Q-10 30 MG capsule Take 30 mg by mouth daily.   D3-1000 25 MCG (1000 UT) tablet Generic drug: Cholecalciferol Take 1,000 Units by mouth daily.   ferrous sulfate 325 (65 FE) MG tablet Take 65 mg by mouth daily with breakfast.   Fish Oil 500 MG Caps Take 500 mg by mouth.   lisinopril 40 MG tablet Commonly known as: ZESTRIL Take 40 mg by mouth daily.   lovastatin 20 MG tablet Commonly known as: MEVACOR Take 20 mg by mouth at bedtime.   metoprolol succinate 25 MG 24 hr tablet Commonly known as: TOPROL-XL Take 25 mg by mouth daily.   multivitamin with minerals tablet Take 1 tablet by mouth daily.   TURMERIC CURCUMIN PO Take 550 mg by mouth.   vitamin C 500 MG tablet Commonly known as: ASCORBIC ACID Take 500 mg by mouth daily.  Allergies: No Known Allergies  Family History: No family history on file.  Social History:  reports that he has quit smoking. He has never used smokeless tobacco. He reports that he does not drink alcohol. No history on file for drug.  ROS: UROLOGY Frequent Urination?: No Hard to postpone urination?: No Burning/pain with urination?: No Get up at night to urinate?: No Leakage of urine?: No Urine stream starts and stops?: No Trouble starting stream?: No Do you have to strain to urinate?: No Blood in urine?: No Urinary tract infection?: No Sexually transmitted disease?: No Injury to kidneys or bladder?: No Painful intercourse?: No Weak stream?: No Erection problems?: No Penile pain?: No  Gastrointestinal Nausea?:  No Vomiting?: No Indigestion/heartburn?: No Diarrhea?: No Constipation?: No  Constitutional Fever: No Night sweats?: No Weight loss?: No Fatigue?: No  Skin Skin rash/lesions?: No Itching?: No  Eyes Blurred vision?: No Double vision?: No  Ears/Nose/Throat Sore throat?: No Sinus problems?: No  Hematologic/Lymphatic Swollen glands?: No Easy bruising?: No  Cardiovascular Leg swelling?: No  Respiratory Cough?: No Shortness of breath?: No  Endocrine Excessive thirst?: No  Musculoskeletal Back pain?: No Joint pain?: No  Neurological Headaches?: No Dizziness?: No  Psychologic Depression?: No Anxiety?: No  Physical Exam: BP (!) 155/78   Pulse 69   Ht 5\' 9"  (1.753 m)   Wt 140 lb (63.5 kg)   BMI 20.67 kg/m   Constitutional:  Alert, No acute distress. HEENT: [place] AT, moist mucus membranes.  Trachea midline, no masses. Cardiovascular: No clubbing, cyanosis, or edema. Respiratory: Normal respiratory effort, no increased work of breathing. GI: Abdomen is soft, nontender, nondistended, no abdominal masses GU: Prostate 35 g, flat, smooth without nodules or induration Skin: No rashes, bruises or suspicious lesions. Neurologic: Grossly intact, no focal deficits, moving all 4 extremities. Psychiatric: Normal mood and affect.   Assessment & Plan:    - Elevated PSA 86 y.o. male with a long history of mild PSA elevation with recent increased 11.4.  His prior PSA results have been requested for review.  We will repeat his PSA today and if it returns to baseline no further evaluation will be needed.  If it remains elevated we discussed options of surveillance versus prostate biopsy.   Abbie Sons, [place] [address] [place], [place] [address] [phone]

## 2019-11-27 LAB — PSA: Prostate Specific Ag, Serum: 15.1 ng/mL — ABNORMAL HIGH (ref 0.0–4.0)

## 2019-11-30 ENCOUNTER — Telehealth: Payer: Self-pay | Admitting: *Deleted

## 2019-11-30 NOTE — Telephone Encounter (Signed)
-----   Message from Abbie Sons, MD sent at 11/29/2019 11:28 AM EST ----- Repeat PSA increased to 15.1.  Recommend scheduling prostate biopsy.  He mentioned in a previous biopsy 20-30 years ago was extremely painful.  That was a time before we are doing nerve blocks.  We can try in the office again with a nerve block or can schedule under sedation if he prefers.

## 2019-11-30 NOTE — Telephone Encounter (Signed)
Notified patient as instructed, patient pleased. Discussed follow-up appointments, patient agrees. He will have it done on 12/09/2019

## 2019-12-09 ENCOUNTER — Other Ambulatory Visit: Payer: Self-pay | Admitting: Urology

## 2019-12-09 ENCOUNTER — Other Ambulatory Visit: Payer: Self-pay

## 2019-12-09 ENCOUNTER — Ambulatory Visit (INDEPENDENT_AMBULATORY_CARE_PROVIDER_SITE_OTHER): Payer: Medicare HMO | Admitting: Urology

## 2019-12-09 VITALS — BP 134/78 | HR 68 | Ht 69.0 in | Wt 140.0 lb

## 2019-12-09 DIAGNOSIS — R972 Elevated prostate specific antigen [PSA]: Secondary | ICD-10-CM | POA: Diagnosis not present

## 2019-12-09 DIAGNOSIS — C61 Malignant neoplasm of prostate: Secondary | ICD-10-CM | POA: Diagnosis not present

## 2019-12-09 MED ORDER — LEVOFLOXACIN 500 MG PO TABS
500.0000 mg | ORAL_TABLET | Freq: Once | ORAL | Status: AC
  Administered 2019-12-09: 500 mg via ORAL

## 2019-12-09 MED ORDER — GENTAMICIN SULFATE 40 MG/ML IJ SOLN
80.0000 mg | Freq: Once | INTRAMUSCULAR | Status: AC
  Administered 2019-12-09: 80 mg via INTRAMUSCULAR

## 2019-12-09 NOTE — Progress Notes (Signed)
Prostate Biopsy Procedure   Informed consent was obtained after discussing risks/benefits of the procedure.  A time out was performed to ensure correct patient identity.  Pre-Procedure: - Last PSA Level: 11/20 11.4; 2/21 15.1 - Gentamicin given prophylactically - Levaquin 500 mg administered PO -Transrectal Ultrasound performed revealing a 34 gm prostate -No significant hypoechoic or median lobe noted; scattered prostatic calculi  Procedure: - Prostate block performed using 10 cc 1% lidocaine and biopsies taken from sextant areas, a total of 12 under ultrasound guidance.  Post-Procedure: - Patient tolerated the procedure well - He was counseled to seek immediate medical attention if experiences any severe pain, significant bleeding, or fevers - Return in one week to discuss biopsy results   John Giovanni, MD

## 2019-12-11 ENCOUNTER — Encounter: Payer: Self-pay | Admitting: Urology

## 2019-12-15 LAB — ANATOMIC PATHOLOGY REPORT: PDF Image: 0

## 2019-12-21 ENCOUNTER — Telehealth: Payer: Self-pay

## 2019-12-21 DIAGNOSIS — C61 Malignant neoplasm of prostate: Secondary | ICD-10-CM

## 2019-12-21 NOTE — Telephone Encounter (Signed)
Incoming call from pt, LM on triage line requesting biopsy results. Please advise.

## 2019-12-22 NOTE — Telephone Encounter (Signed)
I spoke with Mr. Vairo and his wife regarding prostate biopsy pathology.  Prostate volume was 34 g.  He had no post biopsy complaints.  Standard 12 core biopsies were performed.  He had 5/6 right sided cores positive for Gleason 4+3 and one core from the right apex Gleason 4+4 adenocarcinoma.  All left-sided cores showed benign prostate tissue.  His NCCN risk stratification is high risk.  We discussed the need for imaging studies for staging to include a CT of the abdomen and pelvis and bone scan.  If there is no evidence of metastatic disease his life expectancy is >5 years based on Social Security tables.  Treatment options would include ADT or EBRT.  They requested an appointment with medical oncology and will place referrals for both medical and radiation oncology.

## 2019-12-23 ENCOUNTER — Other Ambulatory Visit: Payer: Self-pay | Admitting: Urology

## 2019-12-23 ENCOUNTER — Telehealth: Payer: Self-pay | Admitting: Urology

## 2019-12-24 ENCOUNTER — Ambulatory Visit: Payer: Medicare HMO | Admitting: Urology

## 2019-12-28 ENCOUNTER — Other Ambulatory Visit: Payer: Self-pay

## 2019-12-29 ENCOUNTER — Other Ambulatory Visit: Payer: Self-pay | Admitting: *Deleted

## 2019-12-29 ENCOUNTER — Other Ambulatory Visit: Payer: Self-pay

## 2019-12-29 ENCOUNTER — Encounter: Payer: Self-pay | Admitting: Oncology

## 2019-12-29 ENCOUNTER — Inpatient Hospital Stay: Payer: Medicare HMO | Attending: Oncology | Admitting: Oncology

## 2019-12-29 ENCOUNTER — Ambulatory Visit
Admission: RE | Admit: 2019-12-29 | Discharge: 2019-12-29 | Disposition: A | Discharge status: Home / Self Care | Payer: Medicare HMO | Source: Ambulatory Visit | Attending: Radiation Oncology | Admitting: Radiation Oncology

## 2019-12-29 ENCOUNTER — Inpatient Hospital Stay: Payer: Medicare HMO

## 2019-12-29 VITALS — BP 138/78 | HR 65 | Temp 97.9°F | Resp 18 | Ht 69.0 in | Wt 137.9 lb

## 2019-12-29 DIAGNOSIS — C61 Malignant neoplasm of prostate: Secondary | ICD-10-CM

## 2019-12-29 DIAGNOSIS — Z809 Family history of malignant neoplasm, unspecified: Secondary | ICD-10-CM | POA: Insufficient documentation

## 2019-12-29 DIAGNOSIS — Z87891 Personal history of nicotine dependence: Secondary | ICD-10-CM

## 2019-12-29 DIAGNOSIS — R17 Unspecified jaundice: Secondary | ICD-10-CM | POA: Diagnosis not present

## 2019-12-29 DIAGNOSIS — Z8042 Family history of malignant neoplasm of prostate: Secondary | ICD-10-CM | POA: Diagnosis not present

## 2019-12-29 DIAGNOSIS — R972 Elevated prostate specific antigen [PSA]: Secondary | ICD-10-CM

## 2019-12-29 HISTORY — DX: Malignant neoplasm of prostate: C61

## 2019-12-29 LAB — CBC WITH DIFFERENTIAL/PLATELET
Abs Immature Granulocytes: 0.03 10*3/uL (ref 0.00–0.07)
Basophils Absolute: 0 10*3/uL (ref 0.0–0.1)
Basophils Relative: 0 %
Eosinophils Absolute: 0 10*3/uL (ref 0.0–0.5)
Eosinophils Relative: 1 %
HCT: 41.6 % (ref 39.0–52.0)
Hemoglobin: 14.1 g/dL (ref 13.0–17.0)
Immature Granulocytes: 0 %
Lymphocytes Relative: 15 %
Lymphs Abs: 1 10*3/uL (ref 0.7–4.0)
MCH: 32.7 pg (ref 26.0–34.0)
MCHC: 33.9 g/dL (ref 30.0–36.0)
MCV: 96.5 fL (ref 80.0–100.0)
Monocytes Absolute: 0.5 10*3/uL (ref 0.1–1.0)
Monocytes Relative: 7 %
Neutro Abs: 5.5 10*3/uL (ref 1.7–7.7)
Neutrophils Relative %: 77 %
Platelets: 191 10*3/uL (ref 150–400)
RBC: 4.31 MIL/uL (ref 4.22–5.81)
RDW: 12.7 % (ref 11.5–15.5)
WBC: 7.1 10*3/uL (ref 4.0–10.5)
nRBC: 0 % (ref 0.0–0.2)

## 2019-12-29 LAB — BILIRUBIN, DIRECT: Bilirubin, Direct: 0.3 mg/dL — ABNORMAL HIGH (ref 0.0–0.2)

## 2019-12-29 LAB — COMPREHENSIVE METABOLIC PANEL
ALT: 22 U/L (ref 0–44)
AST: 21 U/L (ref 15–41)
Albumin: 4.5 g/dL (ref 3.5–5.0)
Alkaline Phosphatase: 66 U/L (ref 38–126)
Anion gap: 11 (ref 5–15)
BUN: 16 mg/dL (ref 8–23)
CO2: 24 mmol/L (ref 22–32)
Calcium: 9.2 mg/dL (ref 8.9–10.3)
Chloride: 97 mmol/L — ABNORMAL LOW (ref 98–111)
Creatinine, Ser: 0.86 mg/dL (ref 0.61–1.24)
GFR calc Af Amer: >60 mL/min (ref >60)
GFR calc non Af Amer: >60 mL/min (ref >60)
Glucose, Bld: 115 mg/dL — ABNORMAL HIGH (ref 70–99)
Potassium: 4.9 mmol/L (ref 3.5–5.1)
Sodium: 132 mmol/L — ABNORMAL LOW (ref 135–145)
Total Bilirubin: 2.3 mg/dL — ABNORMAL HIGH (ref 0.3–1.2)
Total Protein: 7.4 g/dL (ref 6.5–8.1)

## 2019-12-29 LAB — PSA: Prostatic Specific Antigen: 16.3 ng/mL — ABNORMAL HIGH (ref 0.00–4.00)

## 2019-12-29 NOTE — Progress Notes (Signed)
Hematology/Oncology Consult note [facility] Telephone:([phone] [phone]   Patient Care Team: Cletis Athens, MD as PCP - General (Internal Medicine)  REFERRING PROVIDER: Abbie Sons, MD  CHIEF COMPLAINTS/REASON FOR VISIT:  Evaluation of prostate cancer  HISTORY OF PRESENTING ILLNESS:   Mathew Martinez is a  86 y.o.  male with PMH listed below was seen in consultation at the request of  Bernardo Heater, Ronda Fairly, MD  for evaluation of prostate cancer Patient was seen by urologist Dr. Bernardo Heater in February for evaluation of elevated PSA.  PSA drawn in November 2020 was elevated at 11.4.  No previous PSA results available.  Per patient, PSA typically fluctuates between 4 and 7 in the past. Also reports remote prostate biopsy and prostate surgery many years ago.  Patient denies any lower urinary tract symptoms.  A repeat PSA was drawn at Dr. Clydene Laming office and that level increased to 15. 12/15/2019, patient underwent prostate biopsy Pathology report was scanned in epic. Left prostate biopsies showed benign pathology. Right base, right mid, right lateral base, right lateral mid, right lateral apex biopsies were positive for adenocarcinoma. 4 out of the 5 biopsies have Gleason score 7 (4+3), right apex has Gleason score 8 (4+4).  Patient was recommended by urologist to complete staging images.  Patient prefers to be referred to cancer center for counseling and management.  Today patient was accompanied by his wife.  He reports feeling well at baseline.  No urinary symptoms. Denies any fever, chills, unintentional weight loss, abdominal pain, bone pain. He is quite active at baseline.  He does push-up exercise.  Review of Systems  Constitutional: Negative for appetite change, chills, fatigue, fever and unexpected weight change.  HENT:   Negative for hearing loss and voice change.   Eyes: Negative for eye problems and icterus.  Respiratory: Negative for  chest tightness, cough and shortness of breath.   Cardiovascular: Negative for chest pain and leg swelling.  Gastrointestinal: Negative for abdominal distention and abdominal pain.  Endocrine: Negative for hot flashes.  Genitourinary: Negative for difficulty urinating, dysuria and frequency.   Musculoskeletal: Negative for arthralgias.  Skin: Negative for itching and rash.  Neurological: Negative for light-headedness and numbness.  Hematological: Negative for adenopathy. Does not bruise/bleed easily.  Psychiatric/Behavioral: Negative for confusion.    MEDICAL HISTORY:  Past Medical History:  Diagnosis Date  . Arthritis    wrist  . BPH (benign prostatic hyperplasia)   . Hard of hearing   . Heart murmur   . History of basal cell carcinoma   . History of dysplastic nevus   . History of melanoma 01/14/2013   left upper back  . Hyperlipemia   . Hypertension     SURGICAL HISTORY: Past Surgical History:  Procedure Laterality Date  . APPENDECTOMY    . CATARACT EXTRACTION W/PHACO Right 03/19/2018   Procedure: CATARACT EXTRACTION PHACO AND INTRAOCULAR LENS PLACEMENT ([place]) COMPLICATED RIGHT;  Surgeon: Leandrew Koyanagi, MD;  Location: [place];  Service: Ophthalmology;  Laterality: Right;  [place]    . COLONOSCOPY WITH PROPOFOL N/A 07/15/2015   Procedure: COLONOSCOPY WITH PROPOFOL;  Surgeon: Josefine Class, MD;  Location: [facility] ENDOSCOPY;  Service: Endoscopy;  Laterality: N/A;  . Duodenojejunostomy    . HERNIA REPAIR    . prostate tissue removal  1996    SOCIAL HISTORY: Social History   Socioeconomic History  . Marital status: Married    Spouse name: Not on file  . Number of  children: Not on file  . Years of education: Not on file  . Highest education level: Not on file  Occupational History  . Occupation: retired  Tobacco Use  . Smoking status: Former Smoker    Packs/day: 0.50    Years: 50.00    Pack years: 25.00    Quit date:  12/29/1999    Years since quitting: 20.0  . Smokeless tobacco: Never Used  Substance and Sexual Activity  . Alcohol use: Never  . Drug use: Never  . Sexual activity: Not on file  Other Topics Concern  . Not on file  Social History Narrative  . Not on file   Social Determinants of Health   Financial Resource Strain:   . Difficulty of Paying Living Expenses: Not on file  Food Insecurity:   . Worried About Charity fundraiser in the Last Year: Not on file  . Ran Out of Food in the Last Year: Not on file  Transportation Needs:   . Lack of Transportation (Medical): Not on file  . Lack of Transportation (Non-Medical): Not on file  Physical Activity:   . Days of Exercise per Week: Not on file  . Minutes of Exercise per Session: Not on file  Stress:   . Feeling of Stress : Not on file  Social Connections:   . Frequency of Communication with Friends and Family: Not on file  . Frequency of Social Gatherings with Friends and Family: Not on file  . Attends Religious Services: Not on file  . Active Member of Clubs or Organizations: Not on file  . Attends Archivist Meetings: Not on file  . Marital Status: Not on file  Intimate Partner Violence:   . Fear of Current or Ex-Partner: Not on file  . Emotionally Abused: Not on file  . Physically Abused: Not on file  . Sexually Abused: Not on file    FAMILY HISTORY: Family History  Problem Relation Age of Onset  . Heart disease Mother   . Diabetes Mother   . Cancer Father   . Prostate cancer Brother     ALLERGIES:  has No Known Allergies.  MEDICATIONS:  Current Outpatient Medications  Medication Sig Dispense Refill  . Calcium Carbonate-Vit D-Min (CALCIUM 1200 PO) Take 600 mg by mouth.    . Cholecalciferol (D3-1000) 1000 units tablet Take 1,000 Units by mouth daily.    [name] co-enzyme Q-10 30 MG capsule Take 30 mg by mouth daily.    . ferrous sulfate 325 (65 FE) MG tablet Take 65 mg by mouth daily with breakfast.     .  lisinopril (PRINIVIL,ZESTRIL) 40 MG tablet Take 40 mg by mouth daily.    [name] lovastatin (MEVACOR) 20 MG tablet Take 20 mg by mouth at bedtime.    . metoprolol succinate (TOPROL-XL) 25 MG 24 hr tablet Take 25 mg by mouth daily.    . Multiple Vitamins-Minerals (MULTIVITAMIN WITH MINERALS) tablet Take 1 tablet by mouth daily.    . Omega-3 Fatty Acids (FISH OIL) 500 MG CAPS Take 500 mg by mouth.    . TURMERIC CURCUMIN PO Take 550 mg by mouth.    . vitamin C (ASCORBIC ACID) 500 MG tablet Take 500 mg by mouth daily.     No current facility-administered medications for this visit.     PHYSICAL EXAMINATION: ECOG PERFORMANCE STATUS: 0 - Asymptomatic Vitals:   12/29/19 0940  BP: 138/78  Pulse: 65  Resp: 18  Temp: 97.9 F (36.6 C)  Filed Weights   12/29/19 0940  Weight: 137 lb 14.4 oz (62.6 kg)    Physical Exam Constitutional:      General: He is not in acute distress. HENT:     Head: Normocephalic and atraumatic.  Eyes:     General: No scleral icterus. Cardiovascular:     Rate and Rhythm: Normal rate and regular rhythm.     Heart sounds: Normal heart sounds.  Pulmonary:     Effort: Pulmonary effort is normal. No respiratory distress.     Breath sounds: No wheezing.  Abdominal:     General: Bowel sounds are normal. There is no distension.     Palpations: Abdomen is soft.  Musculoskeletal:        General: No deformity. Normal range of motion.     Cervical back: Normal range of motion and neck supple.  Skin:    General: Skin is warm and dry.     Findings: No erythema or rash.  Neurological:     Mental Status: He is alert and oriented to person, place, and time. Mental status is at baseline.     Cranial Nerves: No cranial nerve deficit.  Psychiatric:        Mood and Affect: Mood normal.     LABORATORY DATA:  I have reviewed the data as listed Lab Results  Component Value Date   WBC 7.1 12/29/2019   HGB 14.1 12/29/2019   HCT 41.6 12/29/2019   MCV 96.5 12/29/2019   PLT  191 12/29/2019   Recent Labs    12/29/19 1049  NA 132*  K 4.9  CL 97*  CO2 24  GLUCOSE 115*  BUN 16  CREATININE 0.86  CALCIUM 9.2  GFRNONAA >60  GFRAA >60  PROT 7.4  ALBUMIN 4.5  AST 21  ALT 22  ALKPHOS 66  BILITOT 2.3*   Iron/TIBC/Ferritin/ %Sat No results found for: IRON, TIBC, FERRITIN, IRONPCTSAT    RADIOGRAPHIC STUDIES: I have personally reviewed the radiological images as listed and agreed with the findings in the report. No results found.    ASSESSMENT & PLAN:  1. Prostate cancer ([place])   2. Elevated bilirubin   Pathology was reviewed and discussed with patient. Diagnosis of prostate cancer was discussed. #Risk stratification Grade group 4/Gleason 8 (4+4), PSA 15, patient belongs to high risk group.  I discussed with patient that I agree with Dr. Dene Gentry recommendation.  I recommend patient to CT images as well as bone scan to finish staging.  Patient has life expectancy> 5 years, if no distant metastasis are discovered, I recommend radiation plus androgen deprivation therapy for 1-3 years.  Patient has appointment with radiation oncology today as well for discussion of EBRT plus minus brachytherapy. If distant metastasis are discovered, patient will need ADT+/-systemic treatments. We discussed about the rationale of androgen deprivation therapy and possible side effects. Further management pending CT and bone scan. Check baseline CBC, CMP, PSA.  Labs are reviewed. He has elevated total bilirubin level at 2.1. will check direct bilirubin.   Orders Placed This Encounter  Procedures  . NM Bone Scan Whole Body    Standing Status:   Future    Standing Expiration Date:   12/28/2020    Order Specific Question:   ** REASON FOR EXAM (FREE TEXT)    Answer:   prostate cancer staging    Order Specific Question:   If indicated for the ordered procedure, I authorize the administration of a radiopharmaceutical per Radiology protocol  Answer:   Yes    Order Specific  Question:   Preferred imaging location?    Answer:   [facility]    Order Specific Question:   Radiology Contrast Protocol - do NOT remove file path    Answer:   \\[email or web address]  . CT Chest W Contrast    Standing Status:   Future    Standing Expiration Date:   12/28/2020    Order Specific Question:   ** REASON FOR EXAM (FREE TEXT)    Answer:   prostate cancer staging    Order Specific Question:   If indicated for the ordered procedure, I authorize the administration of contrast media per Radiology protocol    Answer:   Yes    Order Specific Question:   Preferred imaging location?    Answer:   [facility]    Order Specific Question:   Radiology Contrast Protocol - do NOT remove file path    Answer:   \\[email or web address]  . CT Abdomen Pelvis W Contrast    Standing Status:   Future    Standing Expiration Date:   12/28/2020    Order Specific Question:   ** REASON FOR EXAM (FREE TEXT)    Answer:   prostate cancer staging    Order Specific Question:   If indicated for the ordered procedure, I authorize the administration of contrast media per Radiology protocol    Answer:   Yes    Order Specific Question:   Preferred imaging location?    Answer:   [facility]    Order Specific Question:   Is Oral Contrast requested for this exam?    Answer:   Yes, Per Radiology protocol    Order Specific Question:   Call Results- Best Contact Number?    AnswerYX:[id number]    Order Specific Question:   Radiology Contrast Protocol - do NOT remove file path    Answer:   \\[email or web address]  . CBC with Differential/Platelet    Standing Status:   Future    Number of Occurrences:   1    Standing Expiration Date:   12/28/2020  . Comprehensive metabolic panel    Standing Status:   Future    Number of Occurrences:   1    Standing Expiration Date:   12/28/2020  . PSA    Standing Status:   Future    Number of  Occurrences:   1    Standing Expiration Date:   12/28/2020    All questions were answered. The patient knows to call the clinic with any problems questions or concerns.  cc Stoioff, Ronda Fairly, MD    Return of visit: To be determined Thank you for this kind referral and the opportunity to participate in the care of this patient. A copy of today's note is routed to referring provider    Earlie Server, MD, PhD Hematology Oncology [facility] Pager- [phone] 12/29/2019

## 2019-12-29 NOTE — Consult Note (Signed)
NEW PATIENT EVALUATION  Name: Mathew Martinez  MRN: [id number]  Date:   12/29/2019     DOB: November 19, 1930   This 86 y.o. male patient presents to the clinic for initial evaluation of probable stage IIb (T1 cN0 M0) Gleason 8 (4+4) adenocarcinoma the prostate status post simple prostatectomy over 25 years prior.  REFERRING PHYSICIAN: Cletis Athens, MD  CHIEF COMPLAINT:  Chief Complaint  Patient presents with  . Elevated PSA    DIAGNOSIS: The encounter diagnosis was Elevated PSA.   PREVIOUS INVESTIGATIONS:  Bone scan and CT scan abdomen pelvis ordered will be reviewed Pathology report reviewed Clinical notes reviewed  HPI: Patient is an 86 year old male who most recently had a PSA of 11.4 drawn in November 2020.  Patient states he has had elevated PSA going back several decades.  He had a elevated PSA and biopsy 25 to 30 years ago and underwent a simple prostatectomy and removal of bladder calculi.  His PSA normalized at that time back in the 1 range.  He now presented with a PSA of 11.4 and underwent transrectal ultrasound-guided biopsy positive for 5 cores confined to the right lateral lobe a mixture of Gleason 8 (4+4) and Gleason 7 (4+3).  Patient has bone scan as well as CT scan of abdomen pelvis ordered and pending.  He states he does have a slow stream although specifically denies any dysuria or gross hematuria.  I been asked to evaluate the patient for possibility of radiation therapy.  PLANNED TREATMENT REGIMEN: Possible prostate and possibly pelvic node radiation  PAST MEDICAL HISTORY:  has a past medical history of Arthritis, BPH (benign prostatic hyperplasia), Hard of hearing, Heart murmur, History of basal cell carcinoma, History of dysplastic nevus, History of melanoma (01/14/2013), Hyperlipemia, and Hypertension.    PAST SURGICAL HISTORY:  Past Surgical History:  Procedure Laterality Date  . APPENDECTOMY    . CATARACT EXTRACTION W/PHACO Right 03/19/2018   Procedure: CATARACT  EXTRACTION PHACO AND INTRAOCULAR LENS PLACEMENT ([place]) COMPLICATED RIGHT;  Surgeon: Leandrew Koyanagi, MD;  Location: [place];  Service: Ophthalmology;  Laterality: Right;  [place]    . COLONOSCOPY WITH PROPOFOL N/A 07/15/2015   Procedure: COLONOSCOPY WITH PROPOFOL;  Surgeon: Josefine Class, MD;  Location: [facility] ENDOSCOPY;  Service: Endoscopy;  Laterality: N/A;  . Duodenojejunostomy    . HERNIA REPAIR    . prostate tissue removal  1996    FAMILY HISTORY: family history includes Cancer in his father; Diabetes in his mother; Heart disease in his mother; Prostate cancer in his brother.  SOCIAL HISTORY:  reports that he quit smoking about 20 years ago. He has a 25.00 pack-year smoking history. He has never used smokeless tobacco. He reports that he does not drink alcohol or use drugs.  ALLERGIES: Patient has no known allergies.  MEDICATIONS:  Current Outpatient Medications  Medication Sig Dispense Refill  . Calcium Carbonate-Vit D-Min (CALCIUM 1200 PO) Take 600 mg by mouth.    . Cholecalciferol (D3-1000) 1000 units tablet Take 1,000 Units by mouth daily.    [name] co-enzyme Q-10 30 MG capsule Take 30 mg by mouth daily.    . ferrous sulfate 325 (65 FE) MG tablet Take 65 mg by mouth daily with breakfast.     . lisinopril (PRINIVIL,ZESTRIL) 40 MG tablet Take 40 mg by mouth daily.    [name] lovastatin (MEVACOR) 20 MG tablet Take 20 mg by mouth at bedtime.    . metoprolol succinate (TOPROL-XL) 25 MG 24 hr tablet  Take 25 mg by mouth daily.    . Multiple Vitamins-Minerals (MULTIVITAMIN WITH MINERALS) tablet Take 1 tablet by mouth daily.    . Omega-3 Fatty Acids (FISH OIL) 500 MG CAPS Take 500 mg by mouth.    . TURMERIC CURCUMIN PO Take 550 mg by mouth.    . vitamin C (ASCORBIC ACID) 500 MG tablet Take 500 mg by mouth daily.     No current facility-administered medications for this encounter.    ECOG PERFORMANCE STATUS:  0 - Asymptomatic  REVIEW OF SYSTEMS: Patient  denies any weight loss, fatigue, weakness, fever, chills or night sweats. Patient denies any loss of vision, blurred vision. Patient denies any ringing  of the ears or hearing loss. No irregular heartbeat. Patient denies heart murmur or history of fainting. Patient denies any chest pain or pain radiating to her upper extremities. Patient denies any shortness of breath, difficulty breathing at night, cough or hemoptysis. Patient denies any swelling in the lower legs. Patient denies any nausea vomiting, vomiting of blood, or coffee ground material in the vomitus. Patient denies any stomach pain. Patient states has had normal bowel movements no significant constipation or diarrhea. Patient denies any dysuria, hematuria or significant nocturia. Patient denies any problems walking, swelling in the joints or loss of balance. Patient denies any skin changes, loss of hair or loss of weight. Patient denies any excessive worrying or anxiety or significant depression. Patient denies any problems with insomnia. Patient denies excessive thirst, polyuria, polydipsia. Patient denies any swollen glands, patient denies easy bruising or easy bleeding. Patient denies any recent infections, allergies or URI. Patient "s visual fields have not changed significantly in recent time.   PHYSICAL EXAM: There were no vitals taken for this visit. Well-developed well-nourished patient in NAD. HEENT reveals PERLA, EOMI, discs not visualized.  Oral cavity is clear. No oral mucosal lesions are identified. Neck is clear without evidence of cervical or supraclavicular adenopathy. Lungs are clear to A&P. Cardiac examination is essentially unremarkable with regular rate and rhythm without murmur rub or thrill. Abdomen is benign with no organomegaly or masses noted. Motor sensory and DTR levels are equal and symmetric in the upper and lower extremities. Cranial nerves II through XII are grossly intact. Proprioception is intact. No peripheral  adenopathy or edema is identified. No motor or sensory levels are noted. Crude visual fields are within normal range.  LABORATORY DATA: Pathology report reviewed    RADIOLOGY RESULTS: Bone scan and CT scans of abdomen pelvis to be reviewed when available   IMPRESSION: Probable stage IIb Gleason 8 (4+4) adenocarcinoma the prostate presenting with a PSA of 33.22 in 86 year old male  PLAN: At this time like to review his bone scan and CT scan of abdomen and pelvis.  I have preliminarily run the [place] on this patient showing only a 22% chance of organ confined disease and a 15% chance of lymph node involvement.  I believe a combination of androgen deprivation therapy along with external beam IMRT radiation therapy would be in order.  Based on his CT scans if this shows stage IV disease certainly would change my treatment approach.  Otherwise would treat his prostate to 80 Gray over 8 weeks.  Right now at the borderline call about his pelvic lymph nodes based on his age and CT findings will make a definitive decision about adding peripheral lymphatics at that time.  Risks and benefits of treatment including increased lower urinary tract symptoms including frequency urgency and nocturia as  well as possible diarrhea and fatigue all were discussed in detail.  I am asking medical oncology who the patient is seen to start androgen deprivation therapy.  I have set him up for CT simulation after his imaging studies are performed.  Patient and wife both seem to comprehend my treatment plan well.  I would like to take this opportunity to thank you for allowing me to participate in the care of your patient.Noreene Filbert, MD

## 2019-12-29 NOTE — Progress Notes (Signed)
Patient here to establish care for prostate cancer.

## 2020-01-12 ENCOUNTER — Other Ambulatory Visit: Payer: Self-pay

## 2020-01-12 ENCOUNTER — Encounter
Admission: RE | Admit: 2020-01-12 | Discharge: 2020-01-12 | Disposition: A | Discharge status: Home / Self Care | Payer: Medicare HMO | Source: Ambulatory Visit | Attending: Oncology | Admitting: Oncology

## 2020-01-12 ENCOUNTER — Other Ambulatory Visit: Payer: Self-pay | Admitting: *Deleted

## 2020-01-12 ENCOUNTER — Ambulatory Visit
Admission: RE | Admit: 2020-01-12 | Discharge: 2020-01-12 | Disposition: A | Discharge status: Home / Self Care | Payer: Medicare HMO | Source: Ambulatory Visit | Attending: Oncology | Admitting: Oncology

## 2020-01-12 DIAGNOSIS — C61 Malignant neoplasm of prostate: Secondary | ICD-10-CM

## 2020-01-12 DIAGNOSIS — R918 Other nonspecific abnormal finding of lung field: Secondary | ICD-10-CM

## 2020-01-12 MED ORDER — IOHEXOL 300 MG/ML  SOLN
100.0000 mL | Freq: Once | INTRAMUSCULAR | Status: AC | PRN
  Administered 2020-01-12: 100 mL via INTRAVENOUS

## 2020-01-12 MED ORDER — TECHNETIUM TC 99M MEDRONATE IV KIT
20.0000 | PACK | Freq: Once | INTRAVENOUS | Status: AC | PRN
  Administered 2020-01-12: 23.551 via INTRAVENOUS

## 2020-01-14 ENCOUNTER — Other Ambulatory Visit: Payer: Self-pay

## 2020-01-18 ENCOUNTER — Other Ambulatory Visit: Payer: Self-pay | Admitting: Oncology

## 2020-01-18 ENCOUNTER — Ambulatory Visit
Admission: RE | Admit: 2020-01-18 | Discharge: 2020-01-18 | Disposition: A | Discharge status: Home / Self Care | Payer: Medicare HMO | Source: Ambulatory Visit | Attending: Radiation Oncology | Admitting: Radiation Oncology

## 2020-01-18 ENCOUNTER — Telehealth: Payer: Self-pay

## 2020-01-18 DIAGNOSIS — C61 Malignant neoplasm of prostate: Secondary | ICD-10-CM | POA: Diagnosis not present

## 2020-01-18 NOTE — Telephone Encounter (Signed)
Patient and his wife have been notified of appt.

## 2020-01-18 NOTE — Telephone Encounter (Signed)
patient scheduled for PET on 3/30. Please schedule MD/ [facility]agon *NEW* a couple days after PET. I will call patient once appts are scheduled. Thanks

## 2020-01-18 NOTE — Telephone Encounter (Signed)
Done.. MD/ [facility]agon *NEW* a couple days after PET has been scheduled as  Requested. 01/21/20 MD @ 900 INJ @ 915

## 2020-01-19 ENCOUNTER — Other Ambulatory Visit: Payer: Self-pay

## 2020-01-19 ENCOUNTER — Encounter: Payer: Self-pay | Admitting: Oncology

## 2020-01-19 ENCOUNTER — Ambulatory Visit
Admission: RE | Admit: 2020-01-19 | Discharge: 2020-01-19 | Disposition: A | Discharge status: Home / Self Care | Payer: Medicare HMO | Source: Ambulatory Visit | Attending: Radiation Oncology | Admitting: Radiation Oncology

## 2020-01-19 ENCOUNTER — Other Ambulatory Visit: Payer: Self-pay | Admitting: Oncology

## 2020-01-19 DIAGNOSIS — C61 Malignant neoplasm of prostate: Secondary | ICD-10-CM | POA: Diagnosis not present

## 2020-01-19 DIAGNOSIS — K5730 Diverticulosis of large intestine without perforation or abscess without bleeding: Secondary | ICD-10-CM | POA: Insufficient documentation

## 2020-01-19 DIAGNOSIS — I2510 Atherosclerotic heart disease of native coronary artery without angina pectoris: Secondary | ICD-10-CM | POA: Insufficient documentation

## 2020-01-19 DIAGNOSIS — R911 Solitary pulmonary nodule: Secondary | ICD-10-CM | POA: Diagnosis not present

## 2020-01-19 DIAGNOSIS — Z0000 Encounter for general adult medical examination without abnormal findings: Secondary | ICD-10-CM | POA: Insufficient documentation

## 2020-01-19 DIAGNOSIS — Z7689 Persons encountering health services in other specified circumstances: Secondary | ICD-10-CM | POA: Insufficient documentation

## 2020-01-19 DIAGNOSIS — Z7189 Other specified counseling: Secondary | ICD-10-CM | POA: Insufficient documentation

## 2020-01-19 DIAGNOSIS — R918 Other nonspecific abnormal finding of lung field: Secondary | ICD-10-CM

## 2020-01-19 LAB — GLUCOSE, CAPILLARY: Glucose-Capillary: 115 mg/dL — ABNORMAL HIGH (ref 70–99)

## 2020-01-19 MED ORDER — FLUDEOXYGLUCOSE F - 18 (FDG) INJECTION
7.5620 | Freq: Once | INTRAVENOUS | Status: AC | PRN
  Administered 2020-01-19: 12:00:00 7.562 via INTRAVENOUS

## 2020-01-21 ENCOUNTER — Other Ambulatory Visit: Payer: Self-pay

## 2020-01-21 ENCOUNTER — Inpatient Hospital Stay: Payer: Medicare HMO

## 2020-01-21 ENCOUNTER — Encounter: Payer: Self-pay | Admitting: Oncology

## 2020-01-21 ENCOUNTER — Inpatient Hospital Stay: Payer: Medicare HMO | Attending: Oncology | Admitting: Oncology

## 2020-01-21 VITALS — BP 137/81 | HR 75 | Temp 98.0°F | Resp 18 | Wt 137.0 lb

## 2020-01-21 DIAGNOSIS — D649 Anemia, unspecified: Secondary | ICD-10-CM | POA: Diagnosis not present

## 2020-01-21 DIAGNOSIS — Z5111 Encounter for antineoplastic chemotherapy: Secondary | ICD-10-CM | POA: Insufficient documentation

## 2020-01-21 DIAGNOSIS — C61 Malignant neoplasm of prostate: Secondary | ICD-10-CM

## 2020-01-21 DIAGNOSIS — R911 Solitary pulmonary nodule: Secondary | ICD-10-CM | POA: Insufficient documentation

## 2020-01-21 DIAGNOSIS — R350 Frequency of micturition: Secondary | ICD-10-CM | POA: Diagnosis not present

## 2020-01-21 DIAGNOSIS — Z79899 Other long term (current) drug therapy: Secondary | ICD-10-CM | POA: Diagnosis not present

## 2020-01-21 MED ORDER — DEGARELIX ACETATE(240 MG DOSE) 120 MG/VIAL ~~LOC~~ SOLR
240.0000 mg | Freq: Once | SUBCUTANEOUS | Status: AC
  Administered 2020-01-21: 10:00:00 240 mg via SUBCUTANEOUS
  Filled 2020-01-21: qty 6

## 2020-01-21 NOTE — Progress Notes (Signed)
Hematology/Oncology Consult note [facility] Telephone:([phone] [phone]   Patient Care Team: Cletis Athens, MD as PCP - General (Internal Medicine) Noreene Filbert, MD as Radiation Oncologist (Radiation Oncology)  REFERRING PROVIDER: Cletis Athens, MD  CHIEF COMPLAINTS/REASON FOR VISIT:  Evaluation of prostate cancer  HISTORY OF PRESENTING ILLNESS:   Mathew Martinez is a  86 y.o.  male with PMH listed below was seen in consultation at the request of  Cletis Athens, MD  for evaluation of prostate cancer Patient was seen by urologist Dr. Bernardo Heater in February for evaluation of elevated PSA.  PSA drawn in November 2020 was elevated at 11.4.  No previous PSA results available.  Per patient, PSA typically fluctuates between 4 and 7 in the past. Also reports remote prostate biopsy and prostate surgery many years ago.  Patient denies any lower urinary tract symptoms.  A repeat PSA was drawn at Dr. Clydene Laming office and that level increased to 15. 12/15/2019, patient underwent prostate biopsy Pathology report was scanned in epic. Left prostate biopsies showed benign pathology. Right base, right mid, right lateral base, right lateral mid, right lateral apex biopsies were positive for adenocarcinoma. 4 out of the 5 biopsies have Gleason score 7 (4+3), right apex has Gleason score 8 (4+4).  Patient was recommended by urologist to complete staging images.  Patient prefers to be referred to cancer center for counseling and management.  Today patient was accompanied by his wife.  He reports feeling well at baseline.  No urinary symptoms. Denies any fever, chills, unintentional weight loss, abdominal pain, bone pain. He is quite active at baseline.  He does push-up exercise.  INTERVAL HISTORY Mathew Martinez is a 86 y.o. male who has above history reviewed by me today presents for follow up visit for management of prostate cancer Problems and complaints are  listed below:  Interval patient has had staging scans including CT Chest abdomen pelvis and bone scan.Bone scan was negative. 01/12/2020  1.8 cm right lower lobe pulmonary nodule, metastatic lung disease versus primary lung cancer.  Otherwise no potential findings of metastatic disease in the chest abdomen or pelvis.  Patient has three-vessel coronary office marked diffuse colonic diverticulosis.  Emphysema  Patient also had PET scan done for further evaluation pulmonary nodule. The patient presented for discussion of image results and management plan.  He has no new complaints.  Review of Systems  Constitutional: Negative for appetite change, chills, fatigue, fever and unexpected weight change.  HENT:   Negative for hearing loss and voice change.   Eyes: Negative for eye problems and icterus.  Respiratory: Negative for chest tightness, cough and shortness of breath.   Cardiovascular: Negative for chest pain and leg swelling.  Gastrointestinal: Negative for abdominal distention and abdominal pain.  Endocrine: Negative for hot flashes.  Genitourinary: Negative for difficulty urinating, dysuria and frequency.   Musculoskeletal: Negative for arthralgias.  Skin: Negative for itching and rash.  Neurological: Negative for light-headedness and numbness.  Hematological: Negative for adenopathy. Does not bruise/bleed easily.  Psychiatric/Behavioral: Negative for confusion.    MEDICAL HISTORY:  Past Medical History:  Diagnosis Date  . Arthritis    wrist  . BPH (benign prostatic hyperplasia)   . Hard of hearing   . Heart murmur   . History of basal cell carcinoma   . History of dysplastic nevus   . History of melanoma 01/14/2013   left upper back  . Hyperlipemia   . Hypertension   . Prostate cancer ([place])  12/29/2019  . Prostate cancer ([place]) 12/29/2019    SURGICAL HISTORY: Past Surgical History:  Procedure Laterality Date  . APPENDECTOMY    . CATARACT EXTRACTION W/PHACO Right 03/19/2018    Procedure: CATARACT EXTRACTION PHACO AND INTRAOCULAR LENS PLACEMENT ([place]) COMPLICATED RIGHT;  Surgeon: Leandrew Koyanagi, MD;  Location: [place];  Service: Ophthalmology;  Laterality: Right;  [place]    . COLONOSCOPY WITH PROPOFOL N/A 07/15/2015   Procedure: COLONOSCOPY WITH PROPOFOL;  Surgeon: Josefine Class, MD;  Location: [facility] ENDOSCOPY;  Service: Endoscopy;  Laterality: N/A;  . Duodenojejunostomy    . HERNIA REPAIR    . prostate tissue removal  1996    SOCIAL HISTORY: Social History   Socioeconomic History  . Marital status: Married    Spouse name: Not on file  . Number of children: Not on file  . Years of education: Not on file  . Highest education level: Not on file  Occupational History  . Occupation: retired  Tobacco Use  . Smoking status: Former Smoker    Packs/day: 0.50    Years: 50.00    Pack years: 25.00    Quit date: 12/29/1999    Years since quitting: 20.0  . Smokeless tobacco: Never Used  Substance and Sexual Activity  . Alcohol use: Never  . Drug use: Never  . Sexual activity: Not on file  Other Topics Concern  . Not on file  Social History Narrative  . Not on file   Social Determinants of Health   Financial Resource Strain:   . Difficulty of Paying Living Expenses:   Food Insecurity:   . Worried About Charity fundraiser in the Last Year:   . Arboriculturist in the Last Year:   Transportation Needs:   . Film/video editor (Medical):   [name] Lack of Transportation (Non-Medical):   Physical Activity:   . Days of Exercise per Week:   . Minutes of Exercise per Session:   Stress:   . Feeling of Stress :   Social Connections:   . Frequency of Communication with Friends and Family:   . Frequency of Social Gatherings with Friends and Family:   . Attends Religious Services:   . Active Member of Clubs or Organizations:   . Attends Archivist Meetings:   [name] Marital Status:   Intimate Partner Violence:   .  Fear of Current or Ex-Partner:   . Emotionally Abused:   [name] Physically Abused:   . Sexually Abused:     FAMILY HISTORY: Family History  Problem Relation Age of Onset  . Heart disease Mother   . Diabetes Mother   . Cancer Father   . Prostate cancer Brother     ALLERGIES:  has No Known Allergies.  MEDICATIONS:  Current Outpatient Medications  Medication Sig Dispense Refill  . Calcium Carbonate-Vit D-Min (CALCIUM 1200 PO) Take 600 mg by mouth.    . Cholecalciferol (D3-1000) 1000 units tablet Take 1,000 Units by mouth daily.    [name] co-enzyme Q-10 30 MG capsule Take 30 mg by mouth daily.    . ferrous sulfate 325 (65 FE) MG tablet Take 65 mg by mouth daily with breakfast.     . lisinopril (PRINIVIL,ZESTRIL) 40 MG tablet Take 40 mg by mouth daily.    [name] lovastatin (MEVACOR) 20 MG tablet Take 20 mg by mouth at bedtime.    . metoprolol succinate (TOPROL-XL) 25 MG 24 hr tablet Take 25 mg by mouth daily.    [name]  Multiple Vitamins-Minerals (MULTIVITAMIN WITH MINERALS) tablet Take 1 tablet by mouth daily.    . Omega-3 Fatty Acids (FISH OIL) 500 MG CAPS Take 500 mg by mouth.    . TURMERIC CURCUMIN PO Take 550 mg by mouth.    . vitamin C (ASCORBIC ACID) 500 MG tablet Take 500 mg by mouth daily.     No current facility-administered medications for this visit.     PHYSICAL EXAMINATION: ECOG PERFORMANCE STATUS: 0 - Asymptomatic Vitals:   01/21/20 0914  BP: 137/81  Pulse: 75  Resp: 18  Temp: 98 F (36.7 C)   Filed Weights   01/21/20 0914  Weight: 137 lb (62.1 kg)    Physical Exam Constitutional:      General: He is not in acute distress. HENT:     Head: Normocephalic and atraumatic.  Eyes:     General: No scleral icterus. Cardiovascular:     Rate and Rhythm: Normal rate and regular rhythm.     Heart sounds: Normal heart sounds.  Pulmonary:     Effort: Pulmonary effort is normal. No respiratory distress.     Breath sounds: No wheezing.  Abdominal:     General: Bowel sounds are  normal. There is no distension.     Palpations: Abdomen is soft.  Musculoskeletal:        General: No deformity. Normal range of motion.     Cervical back: Normal range of motion and neck supple.  Skin:    General: Skin is warm and dry.     Findings: No erythema or rash.  Neurological:     Mental Status: He is alert and oriented to person, place, and time. Mental status is at baseline.     Cranial Nerves: No cranial nerve deficit.     Coordination: Coordination normal.  Psychiatric:        Mood and Affect: Mood normal.     LABORATORY DATA:  I have reviewed the data as listed Lab Results  Component Value Date   WBC 7.1 12/29/2019   HGB 14.1 12/29/2019   HCT 41.6 12/29/2019   MCV 96.5 12/29/2019   PLT 191 12/29/2019   Recent Labs    12/29/19 1049 12/29/19 1050  NA 132*  --   K 4.9  --   CL 97*  --   CO2 24  --   GLUCOSE 115*  --   BUN 16  --   CREATININE 0.86  --   CALCIUM 9.2  --   GFRNONAA >60  --   GFRAA >60  --   PROT 7.4  --   ALBUMIN 4.5  --   AST 21  --   ALT 22  --   ALKPHOS 66  --   BILITOT 2.3*  --   BILIDIR  --  0.3*   Iron/TIBC/Ferritin/ %Sat No results found for: IRON, TIBC, FERRITIN, IRONPCTSAT    RADIOGRAPHIC STUDIES: I have personally reviewed the radiological images as listed and agreed with the findings in the report. CT Chest W Contrast  Result Date: 01/12/2020 CLINICAL DATA:  Primary Cancer Type: Prostate Imaging Indication: Initial staging Initial Cancer Diagnosis Date: 12/15/2019 Established by: Biopsy-proven Detailed Pathology: Gleason 7 EXAM: CT CHEST, ABDOMEN, AND PELVIS WITH CONTRAST TECHNIQUE: Multidetector CT imaging of the chest, abdomen and pelvis was performed following the standard protocol during bolus administration of intravenous contrast. CONTRAST:  111mL OMNIPAQUE IOHEXOL 300 MG/ML  SOLN COMPARISON:  None. FINDINGS: CT CHEST FINDINGS Cardiovascular: Normal heart size. No significant pericardial effusion/thickening.  Three-vessel coronary atherosclerosis. Atherosclerotic nonaneurysmal thoracic aorta. Normal caliber main pulmonary artery. No central pulmonary emboli. Mediastinum/Nodes: No discrete thyroid nodules. Unremarkable esophagus. No pathologically enlarged axillary, mediastinal or hilar lymph nodes. Lungs/Pleura: No pneumothorax. No pleural effusion. Mild centrilobular and paraseptal emphysema. No acute consolidative airspace disease or lung masses. Solid anterior right lower lobe 1.8 cm pulmonary nodule (series 4/image 106). No additional significant pulmonary nodules. Scattered mild cylindrical bronchiectasis in the lingula and basilar lower lobes bilaterally with scattered mucoid impaction in the lingula. Musculoskeletal: No aggressive appearing focal osseous lesions. Mild thoracic spondylosis. CT ABDOMEN PELVIS FINDINGS Hepatobiliary: Normal liver size. Subcentimeter hypodense inferior right liver lesion (series 2/image 67), too small to characterize. No additional liver lesions. Cholecystectomy. No biliary ductal dilatation. Pancreas: Normal, with no mass or duct dilation. Spleen: Normal size. No mass. Adrenals/Urinary Tract: Normal adrenals. No hydronephrosis. Simple 1.2 cm posterior lower right renal cyst. Subcentimeter hypodense renal cortical lesion in the upper right kidney is too small to characterize and requires no follow-up. Normal bladder. Stomach/Bowel: Normal non-distended stomach. Normal caliber small bowel with no small bowel wall thickening. Appendectomy. Marked diffuse colonic diverticulosis, most prominent in the sigmoid colon, with no large bowel wall thickening or significant pericolonic fat stranding. Vascular/Lymphatic: Atherosclerotic nonaneurysmal abdominal aorta. Patent portal, splenic, hepatic and renal veins. No pathologically enlarged lymph nodes in the abdomen or pelvis. Reproductive: Mildly enlarged prostate. Other: No pneumoperitoneum, ascites or focal fluid collection. Musculoskeletal:  No aggressive appearing focal osseous lesions. IMPRESSION: 1. Solid 1.8 cm right lower lobe pulmonary nodule, malignancy not excluded. Differential includes primary bronchogenic carcinoma (particularly given the smoking related changes in the lungs) versus isolated pulmonary metastasis. Consider further evaluation with PET-CT. 2. Otherwise no potential findings of metastatic disease in the chest, abdomen or pelvis. 3. Chronic findings include: Three-vessel coronary atherosclerosis. Marked diffuse colonic diverticulosis. Mildly enlarged prostate. Aortic Atherosclerosis (ICD10-I70.0) and Emphysema (ICD10-J43.9). Electronically Signed   By: Ilona Sorrel M.D.   On: 01/12/2020 09:13   NM Bone Scan Whole Body  Result Date: 01/12/2020 CLINICAL DATA:  Prostate cancer EXAM: NUCLEAR MEDICINE WHOLE BODY BONE SCAN TECHNIQUE: Whole body anterior and posterior images were obtained approximately 3 hours after intravenous injection of radiopharmaceutical. RADIOPHARMACEUTICALS:  23.5 mCi Technetium-69m MDP IV COMPARISON:  None. FINDINGS: No areas of suspicious osseous uptake to suggest osseous metastatic disease. Soft tissue activity normal. IMPRESSION: No evidence of osseous metastatic disease. Electronically Signed   By: Rolm Baptise M.D.   On: 01/12/2020 22:05   CT Abdomen Pelvis W Contrast  Result Date: 01/12/2020 CLINICAL DATA:  Primary Cancer Type: Prostate Imaging Indication: Initial staging Initial Cancer Diagnosis Date: 12/15/2019 Established by: Biopsy-proven Detailed Pathology: Gleason 7 EXAM: CT CHEST, ABDOMEN, AND PELVIS WITH CONTRAST TECHNIQUE: Multidetector CT imaging of the chest, abdomen and pelvis was performed following the standard protocol during bolus administration of intravenous contrast. CONTRAST:  114mL OMNIPAQUE IOHEXOL 300 MG/ML  SOLN COMPARISON:  None. FINDINGS: CT CHEST FINDINGS Cardiovascular: Normal heart size. No significant pericardial effusion/thickening. Three-vessel coronary  atherosclerosis. Atherosclerotic nonaneurysmal thoracic aorta. Normal caliber main pulmonary artery. No central pulmonary emboli. Mediastinum/Nodes: No discrete thyroid nodules. Unremarkable esophagus. No pathologically enlarged axillary, mediastinal or hilar lymph nodes. Lungs/Pleura: No pneumothorax. No pleural effusion. Mild centrilobular and paraseptal emphysema. No acute consolidative airspace disease or lung masses. Solid anterior right lower lobe 1.8 cm pulmonary nodule (series 4/image 106). No additional significant pulmonary nodules. Scattered mild cylindrical bronchiectasis in the lingula and basilar lower lobes bilaterally with scattered mucoid impaction in the lingula.  Musculoskeletal: No aggressive appearing focal osseous lesions. Mild thoracic spondylosis. CT ABDOMEN PELVIS FINDINGS Hepatobiliary: Normal liver size. Subcentimeter hypodense inferior right liver lesion (series 2/image 67), too small to characterize. No additional liver lesions. Cholecystectomy. No biliary ductal dilatation. Pancreas: Normal, with no mass or duct dilation. Spleen: Normal size. No mass. Adrenals/Urinary Tract: Normal adrenals. No hydronephrosis. Simple 1.2 cm posterior lower right renal cyst. Subcentimeter hypodense renal cortical lesion in the upper right kidney is too small to characterize and requires no follow-up. Normal bladder. Stomach/Bowel: Normal non-distended stomach. Normal caliber small bowel with no small bowel wall thickening. Appendectomy. Marked diffuse colonic diverticulosis, most prominent in the sigmoid colon, with no large bowel wall thickening or significant pericolonic fat stranding. Vascular/Lymphatic: Atherosclerotic nonaneurysmal abdominal aorta. Patent portal, splenic, hepatic and renal veins. No pathologically enlarged lymph nodes in the abdomen or pelvis. Reproductive: Mildly enlarged prostate. Other: No pneumoperitoneum, ascites or focal fluid collection. Musculoskeletal: No aggressive  appearing focal osseous lesions. IMPRESSION: 1. Solid 1.8 cm right lower lobe pulmonary nodule, malignancy not excluded. Differential includes primary bronchogenic carcinoma (particularly given the smoking related changes in the lungs) versus isolated pulmonary metastasis. Consider further evaluation with PET-CT. 2. Otherwise no potential findings of metastatic disease in the chest, abdomen or pelvis. 3. Chronic findings include: Three-vessel coronary atherosclerosis. Marked diffuse colonic diverticulosis. Mildly enlarged prostate. Aortic Atherosclerosis (ICD10-I70.0) and Emphysema (ICD10-J43.9). Electronically Signed   By: Ilona Sorrel M.D.   On: 01/12/2020 09:13   NM PET Image Initial (PI) Skull Base To Thigh  Result Date: 01/19/2020 CLINICAL DATA:  Initial treatment strategy for right lower lobe pulmonary nodule. The patient also has prostate cancer. EXAM: NUCLEAR MEDICINE PET SKULL BASE TO THIGH TECHNIQUE: 7.6 mCi F-18 FDG was injected intravenously. Full-ring PET imaging was performed from the skull base to thigh after the radiotracer. CT data was obtained and used for attenuation correction and anatomic localization. Fasting blood glucose: 115 mg/dl COMPARISON:  CT scan 01/12/2020 FINDINGS: Mediastinal blood pool activity: SUV max 2.2 Liver activity: SUV max N/A NECK: No significant abnormal hypermetabolic activity in this region. Incidental CT findings: Bilateral common carotid atherosclerotic calcification. CHEST: Smoothly marginated but slightly lobular 1.8 by 1.2 cm right lower lobe nodule on image 102/3 has a maximum SUV of 1.1. No hypermetabolic pathologic adenopathy chest. Incidental CT findings: Coronary, aortic arch, and branch vessel atherosclerotic vascular disease. Bibasilar scarring in the lower lobes. ABDOMEN/PELVIS: Physiologic activity in the stomach and bowel. Accentuated activity in the prostate gland, especially in the left posterolateral peripheral zone with maximum SUV up to 7.1. There  is accentuated central prostate activity also but this could be from urinary activity in the prostatic urethra. Incidental CT findings: Cholecystectomy. Aortoiliac atherosclerotic vascular disease. Notable colonic diverticulosis. SKELETON: No significant abnormal hypermetabolic activity in this region. Incidental CT findings: none IMPRESSION: 1. The smoothly marginated 1.8 by 1.2 cm right lower lobe pulmonary nodule has a maximum SUV of only 1.1. Possibilities may include benign etiologies or a low-grade tumor such as bronchial carcinoid tumor. Metastatic spread from the prostate is considered unlikely given the lack of other corroborating findings. 2. Accentuated activity in the prostate gland, especially in the left posterolateral peripheral zone, potentially reflecting the patient's known prostate cancer. No hypermetabolic adenopathy or specific findings of metastatic spread. 3. Other imaging findings of potential clinical significance: Aortic Atherosclerosis (ICD10-I70.0). Coronary atherosclerosis. Colonic diverticulosis. Scarring in both lower lobes. Electronically Signed   By: Van Clines M.D.   On: 01/19/2020 16:35  ASSESSMENT & PLAN:  1. Prostate cancer ([place])   2. Lung nodule   [id number] prostate cancer was discussed. #Risk stratification Grade group 4/Gleason 8 (4+4), PSA 15, patient belongs to high risk group.   Images were independently viewed by me and discussed with patient. Rectal normal lung nodule has very low FDG activity on PET scan, likely not metastatic prostate cancer. Low-grade primary lung cancer-ie, adenocarcinoma cannot be ruled out.  Patient has history of tobacco use.  #For his prostate cancer, Patient has life expectancy> 5 years,I recommend radiation plus androgen deprivation therapy for 1-3 years.  Rationale and potential side effects of androgen deprivation therapy were discussed with patient. Patient agrees with the plan.  He will receive loading dose of  Firmagon today.  #Lung nodule, cannot rule out low-grade primary lung cancer. ideally, tissue biopsy of lung nodule is recommended to establish histology diagnosis. However, patient has advanced age and underlying emphysema due to previous smoking history. I asked patient about his preference of proceeding with biopsy.  If he desires biopsy, we can discuss his case on tumor board to find out the best way of biopsying this nodule-CT-guided percutaneous biopsy versus via bronchoscopy .  If he prefers not to proceed with biopsy he may discuss with radiation oncology Dr. Baruch Gouty for evaluation of empiric SBRT. Patient would like to consider and focus on prostate cancer treatment for now.  We discussed about the rationale of androgen deprivation therapy and possible side effects. Further management pending CT and bone scan. Check baseline CBC, CMP, PSA.  Orders Placed This Encounter  Procedures  . CBC with Differential/Platelet    Standing Status:   Future    Standing Expiration Date:   07/22/2021  . Comprehensive metabolic panel    Standing Status:   Future    Standing Expiration Date:   07/22/2021  . PSA    Standing Status:   Future    Standing Expiration Date:   01/20/2021    All questions were answered. The patient knows to call the clinic with any problems questions or concerns.  cc Cletis Athens, MD    Return of visit: 4 weeks.  We spent sufficient time to discuss many aspect of care, questions were answered to patient's satisfaction. A total of 45 minutes was spent on this visit.  With 10 minutes spent reviewing image findings, pathology reports, 30 minutes counseling the patient on the management plan for prostate cancer and lung nodule,  side effects of the treatment, management of symptoms.  Additional 5 minutes was spent on answering patient's questions.     Earlie Server, MD, PhD Hematology Oncology [facility] at [facility] Pager- [phone] 01/21/2020

## 2020-01-21 NOTE — Progress Notes (Signed)
Patient here for follow up. No concerns voiced.  °

## 2020-01-22 ENCOUNTER — Ambulatory Visit: Payer: Medicare HMO | Admitting: Oncology

## 2020-01-22 ENCOUNTER — Ambulatory Visit: Payer: Medicare HMO

## 2020-01-25 ENCOUNTER — Other Ambulatory Visit: Payer: Self-pay | Admitting: *Deleted

## 2020-01-25 ENCOUNTER — Ambulatory Visit: Admission: RE | Admit: 2020-01-25 | Payer: Medicare HMO | Source: Ambulatory Visit

## 2020-01-25 DIAGNOSIS — C61 Malignant neoplasm of prostate: Secondary | ICD-10-CM

## 2020-01-26 ENCOUNTER — Ambulatory Visit
Admission: RE | Admit: 2020-01-26 | Discharge: 2020-01-26 | Disposition: A | Discharge status: Home / Self Care | Payer: Medicare HMO | Source: Ambulatory Visit | Attending: Radiation Oncology | Admitting: Radiation Oncology

## 2020-01-26 DIAGNOSIS — C61 Malignant neoplasm of prostate: Secondary | ICD-10-CM | POA: Diagnosis not present

## 2020-01-27 ENCOUNTER — Ambulatory Visit
Admission: RE | Admit: 2020-01-27 | Discharge: 2020-01-27 | Disposition: A | Discharge status: Home / Self Care | Payer: Medicare HMO | Source: Ambulatory Visit | Attending: Radiation Oncology | Admitting: Radiation Oncology

## 2020-01-27 DIAGNOSIS — C61 Malignant neoplasm of prostate: Secondary | ICD-10-CM | POA: Diagnosis not present

## 2020-01-28 ENCOUNTER — Ambulatory Visit
Admission: RE | Admit: 2020-01-28 | Discharge: 2020-01-28 | Disposition: A | Discharge status: Home / Self Care | Payer: Medicare HMO | Source: Ambulatory Visit | Attending: Radiation Oncology | Admitting: Radiation Oncology

## 2020-01-28 DIAGNOSIS — C61 Malignant neoplasm of prostate: Secondary | ICD-10-CM | POA: Diagnosis not present

## 2020-01-29 ENCOUNTER — Ambulatory Visit
Admission: RE | Admit: 2020-01-29 | Discharge: 2020-01-29 | Disposition: A | Discharge status: Home / Self Care | Payer: Medicare HMO | Source: Ambulatory Visit | Attending: Radiation Oncology | Admitting: Radiation Oncology

## 2020-01-29 DIAGNOSIS — C61 Malignant neoplasm of prostate: Secondary | ICD-10-CM | POA: Diagnosis not present

## 2020-02-01 ENCOUNTER — Ambulatory Visit
Admission: RE | Admit: 2020-02-01 | Discharge: 2020-02-01 | Disposition: A | Discharge status: Home / Self Care | Payer: Medicare HMO | Source: Ambulatory Visit | Attending: Radiation Oncology | Admitting: Radiation Oncology

## 2020-02-01 DIAGNOSIS — C61 Malignant neoplasm of prostate: Secondary | ICD-10-CM | POA: Diagnosis not present

## 2020-02-02 ENCOUNTER — Ambulatory Visit
Admission: RE | Admit: 2020-02-02 | Discharge: 2020-02-02 | Disposition: A | Discharge status: Home / Self Care | Payer: Medicare HMO | Source: Ambulatory Visit | Attending: Radiation Oncology | Admitting: Radiation Oncology

## 2020-02-02 DIAGNOSIS — C61 Malignant neoplasm of prostate: Secondary | ICD-10-CM | POA: Diagnosis not present

## 2020-02-03 ENCOUNTER — Ambulatory Visit
Admission: RE | Admit: 2020-02-03 | Discharge: 2020-02-03 | Disposition: A | Discharge status: Home / Self Care | Payer: Medicare HMO | Source: Ambulatory Visit | Attending: Radiation Oncology | Admitting: Radiation Oncology

## 2020-02-03 DIAGNOSIS — C61 Malignant neoplasm of prostate: Secondary | ICD-10-CM | POA: Diagnosis not present

## 2020-02-04 ENCOUNTER — Ambulatory Visit
Admission: RE | Admit: 2020-02-04 | Discharge: 2020-02-04 | Disposition: A | Discharge status: Home / Self Care | Payer: Medicare HMO | Source: Ambulatory Visit | Attending: Radiation Oncology | Admitting: Radiation Oncology

## 2020-02-04 DIAGNOSIS — C61 Malignant neoplasm of prostate: Secondary | ICD-10-CM | POA: Diagnosis not present

## 2020-02-05 ENCOUNTER — Ambulatory Visit
Admission: RE | Admit: 2020-02-05 | Discharge: 2020-02-05 | Disposition: A | Discharge status: Home / Self Care | Payer: Medicare HMO | Source: Ambulatory Visit | Attending: Radiation Oncology | Admitting: Radiation Oncology

## 2020-02-05 DIAGNOSIS — C61 Malignant neoplasm of prostate: Secondary | ICD-10-CM | POA: Diagnosis not present

## 2020-02-08 ENCOUNTER — Ambulatory Visit
Admission: RE | Admit: 2020-02-08 | Discharge: 2020-02-08 | Disposition: A | Discharge status: Home / Self Care | Payer: Medicare HMO | Source: Ambulatory Visit | Attending: Radiation Oncology | Admitting: Radiation Oncology

## 2020-02-08 DIAGNOSIS — C61 Malignant neoplasm of prostate: Secondary | ICD-10-CM | POA: Diagnosis not present

## 2020-02-09 ENCOUNTER — Ambulatory Visit
Admission: RE | Admit: 2020-02-09 | Discharge: 2020-02-09 | Disposition: A | Discharge status: Home / Self Care | Payer: Medicare HMO | Source: Ambulatory Visit | Attending: Radiation Oncology | Admitting: Radiation Oncology

## 2020-02-09 DIAGNOSIS — C61 Malignant neoplasm of prostate: Secondary | ICD-10-CM | POA: Diagnosis not present

## 2020-02-10 ENCOUNTER — Inpatient Hospital Stay: Payer: Medicare HMO

## 2020-02-10 ENCOUNTER — Ambulatory Visit
Admission: RE | Admit: 2020-02-10 | Discharge: 2020-02-10 | Disposition: A | Discharge status: Home / Self Care | Payer: Medicare HMO | Source: Ambulatory Visit | Attending: Radiation Oncology | Admitting: Radiation Oncology

## 2020-02-10 DIAGNOSIS — C61 Malignant neoplasm of prostate: Secondary | ICD-10-CM | POA: Diagnosis not present

## 2020-02-11 ENCOUNTER — Ambulatory Visit
Admission: RE | Admit: 2020-02-11 | Discharge: 2020-02-11 | Disposition: A | Discharge status: Home / Self Care | Payer: Medicare HMO | Source: Ambulatory Visit | Attending: Radiation Oncology | Admitting: Radiation Oncology

## 2020-02-11 DIAGNOSIS — C61 Malignant neoplasm of prostate: Secondary | ICD-10-CM | POA: Diagnosis not present

## 2020-02-12 ENCOUNTER — Ambulatory Visit
Admission: RE | Admit: 2020-02-12 | Discharge: 2020-02-12 | Disposition: A | Discharge status: Home / Self Care | Payer: Medicare HMO | Source: Ambulatory Visit | Attending: Radiation Oncology | Admitting: Radiation Oncology

## 2020-02-12 DIAGNOSIS — C61 Malignant neoplasm of prostate: Secondary | ICD-10-CM | POA: Diagnosis not present

## 2020-02-15 ENCOUNTER — Ambulatory Visit
Admission: RE | Admit: 2020-02-15 | Discharge: 2020-02-15 | Disposition: A | Discharge status: Home / Self Care | Payer: Medicare HMO | Source: Ambulatory Visit | Attending: Radiation Oncology | Admitting: Radiation Oncology

## 2020-02-15 DIAGNOSIS — C61 Malignant neoplasm of prostate: Secondary | ICD-10-CM | POA: Diagnosis not present

## 2020-02-16 ENCOUNTER — Ambulatory Visit
Admission: RE | Admit: 2020-02-16 | Discharge: 2020-02-16 | Disposition: A | Discharge status: Home / Self Care | Payer: Medicare HMO | Source: Ambulatory Visit | Attending: Radiation Oncology | Admitting: Radiation Oncology

## 2020-02-16 DIAGNOSIS — C61 Malignant neoplasm of prostate: Secondary | ICD-10-CM | POA: Diagnosis not present

## 2020-02-17 ENCOUNTER — Ambulatory Visit
Admission: RE | Admit: 2020-02-17 | Discharge: 2020-02-17 | Disposition: A | Discharge status: Home / Self Care | Payer: Medicare HMO | Source: Ambulatory Visit | Attending: Radiation Oncology | Admitting: Radiation Oncology

## 2020-02-17 DIAGNOSIS — C61 Malignant neoplasm of prostate: Secondary | ICD-10-CM | POA: Diagnosis not present

## 2020-02-18 ENCOUNTER — Inpatient Hospital Stay: Payer: Medicare HMO

## 2020-02-18 ENCOUNTER — Ambulatory Visit
Admission: RE | Admit: 2020-02-18 | Discharge: 2020-02-18 | Disposition: A | Discharge status: Home / Self Care | Payer: Medicare HMO | Source: Ambulatory Visit | Attending: Radiation Oncology | Admitting: Radiation Oncology

## 2020-02-18 ENCOUNTER — Other Ambulatory Visit: Payer: Self-pay

## 2020-02-18 ENCOUNTER — Inpatient Hospital Stay: Payer: Medicare HMO | Admitting: Oncology

## 2020-02-18 ENCOUNTER — Encounter: Payer: Self-pay | Admitting: Oncology

## 2020-02-18 VITALS — BP 123/58 | HR 61 | Temp 96.0°F | Resp 16 | Wt 137.0 lb

## 2020-02-18 DIAGNOSIS — C61 Malignant neoplasm of prostate: Secondary | ICD-10-CM

## 2020-02-18 DIAGNOSIS — Z79899 Other long term (current) drug therapy: Secondary | ICD-10-CM | POA: Diagnosis not present

## 2020-02-18 DIAGNOSIS — R911 Solitary pulmonary nodule: Secondary | ICD-10-CM | POA: Diagnosis not present

## 2020-02-18 DIAGNOSIS — Z5111 Encounter for antineoplastic chemotherapy: Secondary | ICD-10-CM | POA: Diagnosis not present

## 2020-02-18 DIAGNOSIS — D649 Anemia, unspecified: Secondary | ICD-10-CM

## 2020-02-18 DIAGNOSIS — R350 Frequency of micturition: Secondary | ICD-10-CM | POA: Diagnosis not present

## 2020-02-18 LAB — CBC WITH DIFFERENTIAL/PLATELET
Abs Immature Granulocytes: 0.02 10*3/uL (ref 0.00–0.07)
Basophils Absolute: 0 10*3/uL (ref 0.0–0.1)
Basophils Relative: 0 %
Eosinophils Absolute: 0.1 10*3/uL (ref 0.0–0.5)
Eosinophils Relative: 2 %
HCT: 36.5 % — ABNORMAL LOW (ref 39.0–52.0)
Hemoglobin: 12.6 g/dL — ABNORMAL LOW (ref 13.0–17.0)
Immature Granulocytes: 0 %
Lymphocytes Relative: 12 %
Lymphs Abs: 0.8 10*3/uL (ref 0.7–4.0)
MCH: 32.7 pg (ref 26.0–34.0)
MCHC: 34.5 g/dL (ref 30.0–36.0)
MCV: 94.8 fL (ref 80.0–100.0)
Monocytes Absolute: 0.6 10*3/uL (ref 0.1–1.0)
Monocytes Relative: 8 %
Neutro Abs: 5.3 10*3/uL (ref 1.7–7.7)
Neutrophils Relative %: 78 %
Platelets: 168 10*3/uL (ref 150–400)
RBC: 3.85 MIL/uL — ABNORMAL LOW (ref 4.22–5.81)
RDW: 12.8 % (ref 11.5–15.5)
WBC: 6.8 10*3/uL (ref 4.0–10.5)
nRBC: 0 % (ref 0.0–0.2)

## 2020-02-18 LAB — COMPREHENSIVE METABOLIC PANEL
ALT: 26 U/L (ref 0–44)
AST: 24 U/L (ref 15–41)
Albumin: 4 g/dL (ref 3.5–5.0)
Alkaline Phosphatase: 69 U/L (ref 38–126)
Anion gap: 9 (ref 5–15)
BUN: 18 mg/dL (ref 8–23)
CO2: 25 mmol/L (ref 22–32)
Calcium: 8.8 mg/dL — ABNORMAL LOW (ref 8.9–10.3)
Chloride: 98 mmol/L (ref 98–111)
Creatinine, Ser: 0.91 mg/dL (ref 0.61–1.24)
GFR calc Af Amer: >60 mL/min (ref >60)
GFR calc non Af Amer: >60 mL/min (ref >60)
Glucose, Bld: 129 mg/dL — ABNORMAL HIGH (ref 70–99)
Potassium: 4.3 mmol/L (ref 3.5–5.1)
Sodium: 132 mmol/L — ABNORMAL LOW (ref 135–145)
Total Bilirubin: 1.4 mg/dL — ABNORMAL HIGH (ref 0.3–1.2)
Total Protein: 6.9 g/dL (ref 6.5–8.1)

## 2020-02-18 LAB — TSH: TSH: 1.429 u[IU]/mL (ref 0.350–4.500)

## 2020-02-18 MED ORDER — DEGARELIX ACETATE 80 MG ~~LOC~~ SOLR
80.0000 mg | Freq: Once | SUBCUTANEOUS | Status: DC
  Filled 2020-02-18: qty 4

## 2020-02-18 NOTE — Progress Notes (Signed)
Hematology/Oncology Consult note [facility] Telephone:([phone] [phone]   Patient Care Team: Cletis Athens, MD as PCP - General (Internal Medicine) Noreene Filbert, MD as Radiation Oncologist (Radiation Oncology)  REFERRING PROVIDER: Cletis Athens, MD  CHIEF COMPLAINTS/REASON FOR VISIT:  Follow-up for prostate cancer  HISTORY OF PRESENTING ILLNESS:   Mathew Martinez is a  86 y.o.  male with PMH listed below was seen in consultation at the request of  Cletis Athens, MD  for evaluation of prostate cancer Patient was seen by urologist Dr. Bernardo Heater in February for evaluation of elevated PSA.  PSA drawn in November 2020 was elevated at 11.4.  No previous PSA results available.  Per patient, PSA typically fluctuates between 4 and 7 in the past. Also reports remote prostate biopsy and prostate surgery many years ago.  Patient denies any lower urinary tract symptoms.  A repeat PSA was drawn at Dr. Clydene Laming office and that level increased to 15. 12/15/2019, patient underwent prostate biopsy Pathology report was scanned in epic. Left prostate biopsies showed benign pathology. Right base, right mid, right lateral base, right lateral mid, right lateral apex biopsies were positive for adenocarcinoma. 4 out of the 5 biopsies have Gleason score 7 (4+3), right apex has Gleason score 8 (4+4).  Patient was recommended by urologist to complete staging images.  Patient prefers to be referred to cancer center for counseling and management.  Today patient was accompanied by his wife.  He reports feeling well at baseline.  No urinary symptoms. Denies any fever, chills, unintentional weight loss, abdominal pain, bone pain. He is quite active at baseline.  He does push-up exercise.  -01/12/2020 CT chest abdomen pelvis  1.8 cm right lower lobe pulmonary nodule, metastatic lung disease versus primary lung cancer.  Otherwise no potential findings of metastatic disease in the  chest abdomen or pelvis.  Patient has three-vessel coronary office marked diffuse colonic diverticulosis.  Emphysema - PET Rectal normal lung nodule has very low FDG activity on PET scan, likely not metastatic prostate cancer. Low-grade primary lung cancer-ie, adenocarcinoma cannot be ruled out.  Patient has history of tobacco use   Patient has life expectancy> 5 years,I recommend radiation plus androgen deprivation therapy for 1-3 years.  Rationale and potential side effects of androgen deprivation therapy were discussed with patient. . INTERVAL HISTORY Mathew Martinez is a 86 y.o. male who has above history reviewed by me today presents for follow up visit for management of prostate cancer Problems and complaints are listed below: Patient has started prostate radiation. Received Firmagon 4 weeks ago.  He has some hot flash. He noticed his body temperature has been running low compared to his baseline.  Around 95.5-96. Today temperature is 96.  Denies any fever, chills, he reports frequent urination.  No burning sensation.  Denies any diarrhea.  Review of Systems  Constitutional: Negative for appetite change, chills, fatigue, fever and unexpected weight change.  HENT:   Negative for hearing loss and voice change.   Eyes: Negative for eye problems and icterus.  Respiratory: Negative for chest tightness, cough and shortness of breath.   Cardiovascular: Negative for chest pain and leg swelling.  Gastrointestinal: Negative for abdominal distention and abdominal pain.  Endocrine: Negative for hot flashes.  Genitourinary: Positive for frequency. Negative for difficulty urinating and dysuria.   Musculoskeletal: Negative for arthralgias.  Skin: Negative for itching and rash.  Neurological: Negative for light-headedness and numbness.  Hematological: Negative for adenopathy. Does not bruise/bleed easily.  Psychiatric/Behavioral:  Negative for confusion.    MEDICAL HISTORY:  Past Medical  History:  Diagnosis Date  . Arthritis    wrist  . BPH (benign prostatic hyperplasia)   . Hard of hearing   . Heart murmur   . History of basal cell carcinoma   . History of dysplastic nevus   . History of melanoma 01/14/2013   left upper back  . Hyperlipemia   . Hypertension   . Prostate cancer ([place]) 12/29/2019  . Prostate cancer ([place]) 12/29/2019    SURGICAL HISTORY: Past Surgical History:  Procedure Laterality Date  . APPENDECTOMY    . CATARACT EXTRACTION W/PHACO Right 03/19/2018   Procedure: CATARACT EXTRACTION PHACO AND INTRAOCULAR LENS PLACEMENT ([name]) COMPLICATED RIGHT;  Surgeon: Leandrew Koyanagi, MD;  Location: [place];  Service: Ophthalmology;  Laterality: Right;  [place]    . COLONOSCOPY WITH PROPOFOL N/A 07/15/2015   Procedure: COLONOSCOPY WITH PROPOFOL;  Surgeon: Josefine Class, MD;  Location: [facility] ENDOSCOPY;  Service: Endoscopy;  Laterality: N/A;  . Duodenojejunostomy    . HERNIA REPAIR    . prostate tissue removal  1996    SOCIAL HISTORY: Social History   Socioeconomic History  . Marital status: Married    Spouse name: Not on file  . Number of children: Not on file  . Years of education: Not on file  . Highest education level: Not on file  Occupational History  . Occupation: retired  Tobacco Use  . Smoking status: Former Smoker    Packs/day: 0.50    Years: 50.00    Pack years: 25.00    Quit date: 12/29/1999    Years since quitting: 20.1  . Smokeless tobacco: Never Used  Substance and Sexual Activity  . Alcohol use: Never  . Drug use: Never  . Sexual activity: Not on file  Other Topics Concern  . Not on file  Social History Narrative  . Not on file   Social Determinants of Health   Financial Resource Strain:   . Difficulty of Paying Living Expenses:   Food Insecurity:   . Worried About Charity fundraiser in the Last Year:   . Arboriculturist in the Last Year:   Transportation Needs:   . Lexicographer (Medical):   [name] Lack of Transportation (Non-Medical):   Physical Activity:   . Days of Exercise per Week:   . Minutes of Exercise per Session:   Stress:   . Feeling of Stress :   Social Connections:   . Frequency of Communication with Friends and Family:   . Frequency of Social Gatherings with Friends and Family:   . Attends Religious Services:   . Active Member of Clubs or Organizations:   . Attends Archivist Meetings:   [name] Marital Status:   Intimate Partner Violence:   . Fear of Current or Ex-Partner:   . Emotionally Abused:   [name] Physically Abused:   . Sexually Abused:     FAMILY HISTORY: Family History  Problem Relation Age of Onset  . Heart disease Mother   . Diabetes Mother   . Cancer Father   . Prostate cancer Brother     ALLERGIES:  has No Known Allergies.  MEDICATIONS:  Current Outpatient Medications  Medication Sig Dispense Refill  . b complex vitamins capsule Take 1 capsule by mouth daily.    . Calcium Carbonate-Vit D-Min (CALCIUM 1200 PO) Take 600 mg by mouth.    . Cholecalciferol (D3-1000) 1000  units tablet Take 1,000 Units by mouth daily.    [name] co-enzyme Q-10 30 MG capsule Take 30 mg by mouth daily.    . ferrous sulfate 325 (65 FE) MG tablet Take 65 mg by mouth daily with breakfast.     . lisinopril (PRINIVIL,ZESTRIL) 40 MG tablet Take 40 mg by mouth daily.    [name] lovastatin (MEVACOR) 20 MG tablet Take 20 mg by mouth at bedtime.    . metoprolol succinate (TOPROL-XL) 25 MG 24 hr tablet Take 25 mg by mouth daily.    . Multiple Vitamins-Minerals (MULTIVITAMIN WITH MINERALS) tablet Take 1 tablet by mouth daily.    . Omega-3 Fatty Acids (FISH OIL) 500 MG CAPS Take 500 mg by mouth.    . TURMERIC CURCUMIN PO Take 550 mg by mouth.    . vitamin C (ASCORBIC ACID) 500 MG tablet Take 500 mg by mouth daily.     No current facility-administered medications for this visit.     PHYSICAL EXAMINATION: ECOG PERFORMANCE STATUS: 0 -  Asymptomatic Vitals:   02/18/20 1028  BP: (!) 123/58  Pulse: 61  Resp: 16  Temp: (!) 96 F (35.6 C)   Filed Weights   02/18/20 1028  Weight: 137 lb (62.1 kg)    Physical Exam Constitutional:      General: He is not in acute distress. HENT:     Head: Normocephalic and atraumatic.  Eyes:     General: No scleral icterus. Cardiovascular:     Rate and Rhythm: Normal rate and regular rhythm.     Heart sounds: Normal heart sounds.  Pulmonary:     Effort: Pulmonary effort is normal. No respiratory distress.     Breath sounds: No wheezing.  Abdominal:     General: Bowel sounds are normal. There is no distension.     Palpations: Abdomen is soft.  Musculoskeletal:        General: No deformity. Normal range of motion.     Cervical back: Normal range of motion and neck supple.  Skin:    General: Skin is warm and dry.     Findings: No erythema or rash.  Neurological:     Mental Status: He is alert and oriented to person, place, and time. Mental status is at baseline.     Cranial Nerves: No cranial nerve deficit.     Coordination: Coordination normal.  Psychiatric:        Mood and Affect: Mood normal.     LABORATORY DATA:  I have reviewed the data as listed Lab Results  Component Value Date   WBC 6.8 02/18/2020   HGB 12.6 (L) 02/18/2020   HCT 36.5 (L) 02/18/2020   MCV 94.8 02/18/2020   PLT 168 02/18/2020   Recent Labs    12/29/19 1049 12/29/19 1050 02/18/20 0936  NA 132*  --  132*  K 4.9  --  4.3  CL 97*  --  98  CO2 24  --  25  GLUCOSE 115*  --  129*  BUN 16  --  18  CREATININE 0.86  --  0.91  CALCIUM 9.2  --  8.8*  GFRNONAA >60  --  >60  GFRAA >60  --  >60  PROT 7.4  --  6.9  ALBUMIN 4.5  --  4.0  AST 21  --  24  ALT 22  --  26  ALKPHOS 66  --  69  BILITOT 2.3*  --  1.4*  BILIDIR  --  0.3*  --  Iron/TIBC/Ferritin/ %Sat No results found for: IRON, TIBC, FERRITIN, IRONPCTSAT    RADIOGRAPHIC STUDIES: I have personally reviewed the radiological  images as listed and agreed with the findings in the report. No results found.    ASSESSMENT & PLAN:  1. Prostate cancer ([place])   2. Lung nodule   3. Normocytic anemia   T2bN0M0 prostate cancer was discussed. #Risk stratification Grade group 4/Gleason 8 (4+4), PSA 15,  high risk group.   Currently on radiation with androgen deprivation therapy.  Received Firmagon 4 weeks ago.  Tolerated treatment so far. Proceed with [name] today. I will switch to [name] in 4 weeks. Continue follow-up with radiation oncology to complete course of prostate radiation. #Lung nodule, cannot rule out underlying malignancy.  Patient is not interested in biopsy and plans to get empiric radiation SBRT.  Normocytic anemia, likely due to radiation.  Monitor.  Hemoglobin is 12.6. I will check TSH as patient reports that his body temperature seems to be lower than his baseline.  He is asymptomatic otherwise.  Orders Placed This Encounter  Procedures  . TSH    Standing Status:   Future    Number of Occurrences:   1    Standing Expiration Date:   02/17/2021    All questions were answered. The patient knows to call the clinic with any problems questions or concerns.   Return of visit: 4 weeks.   Earlie Server, MD, PhD Hematology Oncology [facility] at [facility] Pager- [phone] 02/18/2020

## 2020-02-19 ENCOUNTER — Ambulatory Visit
Admission: RE | Admit: 2020-02-19 | Discharge: 2020-02-19 | Disposition: A | Discharge status: Home / Self Care | Payer: Medicare HMO | Source: Ambulatory Visit | Attending: Radiation Oncology | Admitting: Radiation Oncology

## 2020-02-19 DIAGNOSIS — C61 Malignant neoplasm of prostate: Secondary | ICD-10-CM | POA: Diagnosis not present

## 2020-02-22 ENCOUNTER — Ambulatory Visit
Admission: RE | Admit: 2020-02-22 | Discharge: 2020-02-22 | Disposition: A | Discharge status: Home / Self Care | Payer: Medicare HMO | Source: Ambulatory Visit | Attending: Radiation Oncology | Admitting: Radiation Oncology

## 2020-02-22 DIAGNOSIS — C61 Malignant neoplasm of prostate: Secondary | ICD-10-CM | POA: Insufficient documentation

## 2020-02-23 ENCOUNTER — Ambulatory Visit
Admission: RE | Admit: 2020-02-23 | Discharge: 2020-02-23 | Disposition: A | Discharge status: Home / Self Care | Payer: Medicare HMO | Source: Ambulatory Visit | Attending: Radiation Oncology | Admitting: Radiation Oncology

## 2020-02-23 DIAGNOSIS — C61 Malignant neoplasm of prostate: Secondary | ICD-10-CM | POA: Diagnosis not present

## 2020-02-24 ENCOUNTER — Ambulatory Visit
Admission: RE | Admit: 2020-02-24 | Discharge: 2020-02-24 | Disposition: A | Discharge status: Home / Self Care | Payer: Medicare HMO | Source: Ambulatory Visit | Attending: Radiation Oncology | Admitting: Radiation Oncology

## 2020-02-24 ENCOUNTER — Inpatient Hospital Stay: Payer: Medicare HMO

## 2020-02-24 DIAGNOSIS — C61 Malignant neoplasm of prostate: Secondary | ICD-10-CM | POA: Diagnosis not present

## 2020-02-25 ENCOUNTER — Ambulatory Visit
Admission: RE | Admit: 2020-02-25 | Discharge: 2020-02-25 | Disposition: A | Discharge status: Home / Self Care | Payer: Medicare HMO | Source: Ambulatory Visit | Attending: Radiation Oncology | Admitting: Radiation Oncology

## 2020-02-25 DIAGNOSIS — C61 Malignant neoplasm of prostate: Secondary | ICD-10-CM | POA: Diagnosis not present

## 2020-02-26 ENCOUNTER — Ambulatory Visit
Admission: RE | Admit: 2020-02-26 | Discharge: 2020-02-26 | Disposition: A | Discharge status: Home / Self Care | Payer: Medicare HMO | Source: Ambulatory Visit | Attending: Radiation Oncology | Admitting: Radiation Oncology

## 2020-02-26 DIAGNOSIS — C61 Malignant neoplasm of prostate: Secondary | ICD-10-CM | POA: Diagnosis not present

## 2020-02-29 ENCOUNTER — Ambulatory Visit
Admission: RE | Admit: 2020-02-29 | Discharge: 2020-02-29 | Disposition: A | Discharge status: Home / Self Care | Payer: Medicare HMO | Source: Ambulatory Visit | Attending: Radiation Oncology | Admitting: Radiation Oncology

## 2020-02-29 DIAGNOSIS — C61 Malignant neoplasm of prostate: Secondary | ICD-10-CM | POA: Diagnosis not present

## 2020-03-01 ENCOUNTER — Ambulatory Visit
Admission: RE | Admit: 2020-03-01 | Discharge: 2020-03-01 | Disposition: A | Discharge status: Home / Self Care | Payer: Medicare HMO | Source: Ambulatory Visit | Attending: Radiation Oncology | Admitting: Radiation Oncology

## 2020-03-01 DIAGNOSIS — C61 Malignant neoplasm of prostate: Secondary | ICD-10-CM | POA: Diagnosis not present

## 2020-03-02 ENCOUNTER — Ambulatory Visit
Admission: RE | Admit: 2020-03-02 | Discharge: 2020-03-02 | Disposition: A | Discharge status: Home / Self Care | Payer: Medicare HMO | Source: Ambulatory Visit | Attending: Radiation Oncology | Admitting: Radiation Oncology

## 2020-03-02 DIAGNOSIS — C61 Malignant neoplasm of prostate: Secondary | ICD-10-CM | POA: Diagnosis not present

## 2020-03-03 ENCOUNTER — Ambulatory Visit
Admission: RE | Admit: 2020-03-03 | Discharge: 2020-03-03 | Disposition: A | Discharge status: Home / Self Care | Payer: Medicare HMO | Source: Ambulatory Visit | Attending: Radiation Oncology | Admitting: Radiation Oncology

## 2020-03-03 DIAGNOSIS — C61 Malignant neoplasm of prostate: Secondary | ICD-10-CM | POA: Diagnosis not present

## 2020-03-04 ENCOUNTER — Ambulatory Visit
Admission: RE | Admit: 2020-03-04 | Discharge: 2020-03-04 | Disposition: A | Discharge status: Home / Self Care | Payer: Medicare HMO | Source: Ambulatory Visit | Attending: Radiation Oncology | Admitting: Radiation Oncology

## 2020-03-04 DIAGNOSIS — C61 Malignant neoplasm of prostate: Secondary | ICD-10-CM | POA: Diagnosis not present

## 2020-03-07 ENCOUNTER — Ambulatory Visit
Admission: RE | Admit: 2020-03-07 | Discharge: 2020-03-07 | Disposition: A | Discharge status: Home / Self Care | Payer: Medicare HMO | Source: Ambulatory Visit | Attending: Radiation Oncology | Admitting: Radiation Oncology

## 2020-03-07 DIAGNOSIS — C61 Malignant neoplasm of prostate: Secondary | ICD-10-CM | POA: Diagnosis not present

## 2020-03-08 ENCOUNTER — Ambulatory Visit
Admission: RE | Admit: 2020-03-08 | Discharge: 2020-03-08 | Disposition: A | Discharge status: Home / Self Care | Payer: Medicare HMO | Source: Ambulatory Visit | Attending: Radiation Oncology | Admitting: Radiation Oncology

## 2020-03-08 DIAGNOSIS — Z5111 Encounter for antineoplastic chemotherapy: Secondary | ICD-10-CM | POA: Diagnosis not present

## 2020-03-08 DIAGNOSIS — C61 Malignant neoplasm of prostate: Secondary | ICD-10-CM | POA: Diagnosis not present

## 2020-03-09 ENCOUNTER — Inpatient Hospital Stay: Payer: Medicare HMO | Attending: Oncology

## 2020-03-09 ENCOUNTER — Other Ambulatory Visit: Payer: Self-pay

## 2020-03-09 ENCOUNTER — Ambulatory Visit
Admission: RE | Admit: 2020-03-09 | Discharge: 2020-03-09 | Disposition: A | Discharge status: Home / Self Care | Payer: Medicare HMO | Source: Ambulatory Visit | Attending: Radiation Oncology | Admitting: Radiation Oncology

## 2020-03-09 DIAGNOSIS — C61 Malignant neoplasm of prostate: Secondary | ICD-10-CM | POA: Diagnosis not present

## 2020-03-09 DIAGNOSIS — Z5111 Encounter for antineoplastic chemotherapy: Secondary | ICD-10-CM | POA: Insufficient documentation

## 2020-03-09 LAB — CBC
HCT: 35.5 % — ABNORMAL LOW (ref 39.0–52.0)
Hemoglobin: 12.2 g/dL — ABNORMAL LOW (ref 13.0–17.0)
MCH: 32.6 pg (ref 26.0–34.0)
MCHC: 34.4 g/dL (ref 30.0–36.0)
MCV: 94.9 fL (ref 80.0–100.0)
Platelets: 181 10*3/uL (ref 150–400)
RBC: 3.74 MIL/uL — ABNORMAL LOW (ref 4.22–5.81)
RDW: 13.3 % (ref 11.5–15.5)
WBC: 5.6 10*3/uL (ref 4.0–10.5)
nRBC: 0 % (ref 0.0–0.2)

## 2020-03-10 ENCOUNTER — Ambulatory Visit
Admission: RE | Admit: 2020-03-10 | Discharge: 2020-03-10 | Disposition: A | Discharge status: Home / Self Care | Payer: Medicare HMO | Source: Ambulatory Visit | Attending: Radiation Oncology | Admitting: Radiation Oncology

## 2020-03-10 DIAGNOSIS — C61 Malignant neoplasm of prostate: Secondary | ICD-10-CM | POA: Diagnosis not present

## 2020-03-11 ENCOUNTER — Ambulatory Visit
Admission: RE | Admit: 2020-03-11 | Discharge: 2020-03-11 | Disposition: A | Discharge status: Home / Self Care | Payer: Medicare HMO | Source: Ambulatory Visit | Attending: Radiation Oncology | Admitting: Radiation Oncology

## 2020-03-11 DIAGNOSIS — C61 Malignant neoplasm of prostate: Secondary | ICD-10-CM | POA: Diagnosis not present

## 2020-03-14 ENCOUNTER — Ambulatory Visit
Admission: RE | Admit: 2020-03-14 | Discharge: 2020-03-14 | Disposition: A | Discharge status: Home / Self Care | Payer: Medicare HMO | Source: Ambulatory Visit | Attending: Radiation Oncology | Admitting: Radiation Oncology

## 2020-03-14 DIAGNOSIS — C61 Malignant neoplasm of prostate: Secondary | ICD-10-CM | POA: Diagnosis not present

## 2020-03-15 ENCOUNTER — Ambulatory Visit
Admission: RE | Admit: 2020-03-15 | Discharge: 2020-03-15 | Disposition: A | Discharge status: Home / Self Care | Payer: Medicare HMO | Source: Ambulatory Visit | Attending: Radiation Oncology | Admitting: Radiation Oncology

## 2020-03-15 ENCOUNTER — Other Ambulatory Visit: Payer: Self-pay | Admitting: *Deleted

## 2020-03-15 DIAGNOSIS — C61 Malignant neoplasm of prostate: Secondary | ICD-10-CM

## 2020-03-16 ENCOUNTER — Ambulatory Visit
Admission: RE | Admit: 2020-03-16 | Discharge: 2020-03-16 | Disposition: A | Discharge status: Home / Self Care | Payer: Medicare HMO | Source: Ambulatory Visit | Attending: Radiation Oncology | Admitting: Radiation Oncology

## 2020-03-16 DIAGNOSIS — C61 Malignant neoplasm of prostate: Secondary | ICD-10-CM | POA: Diagnosis not present

## 2020-03-17 ENCOUNTER — Other Ambulatory Visit: Payer: Self-pay

## 2020-03-17 ENCOUNTER — Inpatient Hospital Stay: Payer: Medicare HMO

## 2020-03-17 ENCOUNTER — Telehealth: Payer: Self-pay

## 2020-03-17 ENCOUNTER — Ambulatory Visit
Admission: RE | Admit: 2020-03-17 | Discharge: 2020-03-17 | Disposition: A | Discharge status: Home / Self Care | Payer: Medicare HMO | Source: Ambulatory Visit | Attending: Radiation Oncology | Admitting: Radiation Oncology

## 2020-03-17 DIAGNOSIS — C61 Malignant neoplasm of prostate: Secondary | ICD-10-CM

## 2020-03-17 DIAGNOSIS — Z5111 Encounter for antineoplastic chemotherapy: Secondary | ICD-10-CM | POA: Diagnosis not present

## 2020-03-17 MED ORDER — LEUPROLIDE ACETATE (4 MONTH) 30 MG ~~LOC~~ KIT
30.0000 mg | PACK | Freq: Once | SUBCUTANEOUS | Status: AC
  Administered 2020-03-17: 30 mg via SUBCUTANEOUS
  Filled 2020-03-17: qty 30

## 2020-03-17 NOTE — Telephone Encounter (Signed)
Nutrition Assessment   Reason for Assessment: Referral from Dr. Baruch Gouty for weight loss.    ASSESSMENT:  86 year old male with prostate cancer. Patient currently receiving radiation and lupron.    Spoke with patient and wife via phone this pm.  Patient hard to hear over the phone so mostly talked with wife and patient in the background.  Patient reports good/really good appetite.  Typically eats 3 meals per day and 2 snacks.  Breakfast is usually oatmeal or 1 egg and toast with coffee and fruit.  AM snack is blueberries and 1/2 bowl of grits.  Lunch is tuna salad sandwich or tomato and diary free cheese or soup and peanut butter sandwich. PM snack is apple.  Supper is meat and vegetables (meatloaf, oven roasted potatoes, green beans or greens.  Patient has modified his diet due to diarrhea from radiation treatments.  Reports diarrhea is every other day.  Has not taken anything for diarrhea due to not wanting to get constipated.   Wife concerned about sugary foods so has cut out lactose free ice cream.    Medications: calcium carbonate, fe sulfate, MVI, omega 3, vit C   Labs: reviewed   Anthropometrics:   Height: 69 inches Weight: 133 lb per Aria UBW: 137 lb-140 lb  BMI: 20   Estimated Energy Needs  Kcals: 1800-2100 Protein: 90-105 g Fluid: 2.0 L   NUTRITION DIAGNOSIS: Inadequate oral intake related to altered GI function from radiation, concern for foods effecting cancer as evidenced by weight loss   INTERVENTION:  Provided calorie goal to reach each day to provide weight gain.  Encouraged patient to keep daily log and track calories.  Discussed ways to add calories and protein to current eating pattern.  Challenging with concern for diarrhea.   Provided samples of [employer] and Orgain shake (diary free) for patient to try and drink between meals for added calories and protein.  Provided contact information   MONITORING, EVALUATION, GOAL: weight trends,  intake   Next Visit: June 21 face to face  Jaion Lagrange B. Zenia Resides, [place], [place] Registered Dietitian [phone] (pager)

## 2020-03-18 ENCOUNTER — Ambulatory Visit
Admission: RE | Admit: 2020-03-18 | Discharge: 2020-03-18 | Disposition: A | Discharge status: Home / Self Care | Payer: Medicare HMO | Source: Ambulatory Visit | Attending: Radiation Oncology | Admitting: Radiation Oncology

## 2020-03-18 DIAGNOSIS — C61 Malignant neoplasm of prostate: Secondary | ICD-10-CM | POA: Diagnosis not present

## 2020-03-22 ENCOUNTER — Ambulatory Visit
Admission: RE | Admit: 2020-03-22 | Discharge: 2020-03-22 | Disposition: A | Discharge status: Home / Self Care | Payer: Medicare HMO | Source: Ambulatory Visit | Attending: Radiation Oncology | Admitting: Radiation Oncology

## 2020-03-22 DIAGNOSIS — C61 Malignant neoplasm of prostate: Secondary | ICD-10-CM | POA: Insufficient documentation

## 2020-04-04 ENCOUNTER — Telehealth: Payer: Self-pay | Admitting: *Deleted

## 2020-04-04 NOTE — Telephone Encounter (Signed)
Mathew Martinez denies shortness of breath, and states that he had this same problem with the [name] and his lips before he switched to [name] He reports problems and sweats and hot flashes which are better now. He states he does not know if it is left over from [name] and coming back again now. He is not sure he wants to go back on the [name]. He said he looked it up and it is a rare side effect of [name]. He states he has had this redness and swelling for the past week now.

## 2020-04-04 NOTE — Telephone Encounter (Signed)
I am not sure if this is due to leupron. If he prefers, we can switch him back to monthly firmagon. Let me know what he wants. Any SOB?

## 2020-04-04 NOTE — Telephone Encounter (Signed)
Patient called reporting that he is having side effects from the [name]. His lips are red, puffy and burning. He has tried ice and warm salt water. He asks if this is something he is going to have to deal with being on the [name] and if so what can be done for his symptoms. Please advise

## 2020-04-05 NOTE — Telephone Encounter (Signed)
Mathew Martinez please schedule patient for MD only appt for further discussion of medication and symptoms.

## 2020-04-05 NOTE — Telephone Encounter (Signed)
Called to get pt MD appt scheduled as requested  Pts wife Manuela Schwartz stated that he seems to be doing a lot better and she asked him if he felt as if he needed to be seen at this time. Pt got on the line and stated that he doesn't need to be seen at this time and  will keep his 03/28/20 already scheduled appts.

## 2020-04-06 ENCOUNTER — Other Ambulatory Visit: Payer: Self-pay

## 2020-04-06 ENCOUNTER — Other Ambulatory Visit: Payer: Self-pay | Admitting: *Deleted

## 2020-04-06 ENCOUNTER — Encounter: Payer: Self-pay | Admitting: Radiation Oncology

## 2020-04-06 ENCOUNTER — Ambulatory Visit
Admission: RE | Admit: 2020-04-06 | Discharge: 2020-04-06 | Disposition: A | Discharge status: Home / Self Care | Payer: Medicare HMO | Source: Ambulatory Visit | Attending: Radiation Oncology | Admitting: Radiation Oncology

## 2020-04-06 VITALS — BP 115/63 | HR 65 | Temp 97.7°F | Resp 12 | Wt 137.0 lb

## 2020-04-06 DIAGNOSIS — Z923 Personal history of irradiation: Secondary | ICD-10-CM | POA: Diagnosis not present

## 2020-04-06 DIAGNOSIS — C61 Malignant neoplasm of prostate: Secondary | ICD-10-CM

## 2020-04-06 NOTE — Progress Notes (Signed)
Radiation Oncology Follow up Note old patient new area right lung nodule  Name: Mathew Martinez   Date:   04/06/2020 MRN:  [id number] DOB: 09-08-1931    This 86 y.o. male presents to the clinic today for 2-week follow-up status post radiation therapy.  For salvage for stage IIb Gleason 8 (4+4) adenocarcinoma the prostate presenting with a PSA of 11.4  REFERRING PROVIDER: Cletis Athens, MD  HPI: Patient is an 86 year old male now out 2 weeks having completed radiation therapy to his prostate and pelvic nodes for Gleason 8 (4+4) adenocarcinoma presenting with a PSA of 11.4 seen today in routine follow-up he is doing well.  He specifically denies any increased lower urinary tract symptoms diarrhea or fatigue we are tracking a right lung nodule.  It was showed low level hypermetabolic activity on PET scan.  We are considering this a possible primary early stage bronchogenic carcinoma..  COMPLICATIONS OF TREATMENT: none  FOLLOW UP COMPLIANCE: keeps appointments   PHYSICAL EXAM:  BP 115/63 (BP Location: Left Arm, Patient Position: Sitting, Cuff Size: Normal)   Pulse 65   Temp 97.7 F (36.5 C) (Tympanic)   Resp 12   Wt 137 lb (62.1 kg)   BMI 20.23 kg/m  Well-developed well-nourished patient in NAD. HEENT reveals PERLA, EOMI, discs not visualized.  Oral cavity is clear. No oral mucosal lesions are identified. Neck is clear without evidence of cervical or supraclavicular adenopathy. Lungs are clear to A&P. Cardiac examination is essentially unremarkable with regular rate and rhythm without murmur rub or thrill. Abdomen is benign with no organomegaly or masses noted. Motor sensory and DTR levels are equal and symmetric in the upper and lower extremities. Cranial nerves II through XII are grossly intact. Proprioception is intact. No peripheral adenopathy or edema is identified. No motor or sensory levels are noted. Crude visual fields are within normal range.  RADIOLOGY RESULTS: CT scan and PET CT  scans reviewed  PLAN: At this time still not convinced this is bronchogenic carcinoma.  Like to see the patient back in about 3 months with a CT scan at that time try to document any progression of this nodule.  If that is the case would offer SBRT.  From his prostate standpoint he is doing well we will continue to monitor him with PSA at the next follow-up appointment.  Patient comprehends my recommendation and treatment plan well.  I would like to take this opportunity to thank you for allowing me to participate in the care of your patient.Noreene Filbert, MD

## 2020-04-11 ENCOUNTER — Other Ambulatory Visit: Payer: Self-pay

## 2020-04-11 ENCOUNTER — Inpatient Hospital Stay: Payer: Medicare HMO | Attending: Radiation Oncology

## 2020-04-11 ENCOUNTER — Other Ambulatory Visit: Payer: Self-pay | Admitting: *Deleted

## 2020-04-11 MED ORDER — METOPROLOL SUCCINATE ER 25 MG PO TB24: 25.0000 mg | ORAL_TABLET | Freq: Every day | ORAL | 3 refills | Status: DC

## 2020-04-11 MED ORDER — LOVASTATIN 20 MG PO TABS: 20.0000 mg | ORAL_TABLET | Freq: Every day | ORAL | 3 refills | Status: DC

## 2020-04-11 NOTE — Progress Notes (Signed)
Nutrition Follow-up:  Patient with prostate cancer.  Patient has completed radiation treatments (6/1) and receiving lupron.   Met with patient in clinic for nutrition follow-up.  Patient brought in calorie count for the last 2 weeks.  Patient has been consuming 1800-2400 calories daily except for one day at 1600 calories over the last 2 weeks.  Patient like the [employer] and has purchased some.  Drinks about 1/2 shake per day.  "Rich on his stomach but likes the taste better than other shakes."  Patient is including good sources of protein with meals and snacks.   Reports 1 watery stool in the last 14 days where he took imodium.  Typically has solid bowel movement in the am then later in the day maybe more loose  Patient does yoga, walks or rides stationary bike daily for exercises.   Medications: reviewed  Labs: reviewed  Anthropometrics:   Weight today 137 lb 9 oz today in clinic, increased from 133 lb 5/25  6/1 135.5 lb 6/16 137 lb 1 oz  NUTRITION DIAGNOSIS: Inadequate oral intake improving   INTERVENTION:  Patient to continue monitoring calories to promote weight gain.   Patient to continue [employer].  Patient questioned if shakes or soy milk would cause red/swollen lips. Thinks it maybe more related to firmagon.   Discussed stopping them to see if could be the cause.   Encouraged continued exercise as well.  Patient has contact information    MONITORING, EVALUATION, GOAL: weight trends, intake   NEXT VISIT: Thursday, July 22 prior to seeing Dr. Betha Loa B. Zenia Resides, [place], [place] Registered Dietitian [phone] (pager)

## 2020-04-27 ENCOUNTER — Encounter: Payer: Self-pay | Admitting: Oncology

## 2020-04-27 ENCOUNTER — Inpatient Hospital Stay: Payer: Medicare HMO | Admitting: Oncology

## 2020-04-27 ENCOUNTER — Inpatient Hospital Stay: Payer: Medicare HMO | Attending: Oncology

## 2020-04-27 VITALS — BP 126/62 | HR 65 | Temp 96.8°F | Resp 18 | Wt 139.9 lb

## 2020-04-27 DIAGNOSIS — D649 Anemia, unspecified: Secondary | ICD-10-CM

## 2020-04-27 DIAGNOSIS — C61 Malignant neoplasm of prostate: Secondary | ICD-10-CM

## 2020-04-27 DIAGNOSIS — Z923 Personal history of irradiation: Secondary | ICD-10-CM | POA: Insufficient documentation

## 2020-04-27 DIAGNOSIS — T783XXA Angioneurotic edema, initial encounter: Secondary | ICD-10-CM | POA: Diagnosis not present

## 2020-04-27 DIAGNOSIS — R911 Solitary pulmonary nodule: Secondary | ICD-10-CM

## 2020-04-27 DIAGNOSIS — R944 Abnormal results of kidney function studies: Secondary | ICD-10-CM | POA: Insufficient documentation

## 2020-04-27 DIAGNOSIS — Z87891 Personal history of nicotine dependence: Secondary | ICD-10-CM | POA: Diagnosis not present

## 2020-04-27 LAB — COMPREHENSIVE METABOLIC PANEL
ALT: 36 U/L (ref 0–44)
AST: 31 U/L (ref 15–41)
Albumin: 4.1 g/dL (ref 3.5–5.0)
Alkaline Phosphatase: 69 U/L (ref 38–126)
Anion gap: 9 (ref 5–15)
BUN: 25 mg/dL — ABNORMAL HIGH (ref 8–23)
CO2: 27 mmol/L (ref 22–32)
Calcium: 9 mg/dL (ref 8.9–10.3)
Chloride: 101 mmol/L (ref 98–111)
Creatinine, Ser: 1.13 mg/dL (ref 0.61–1.24)
GFR calc Af Amer: >60 mL/min (ref >60)
GFR calc non Af Amer: 58 mL/min — ABNORMAL LOW (ref >60)
Glucose, Bld: 125 mg/dL — ABNORMAL HIGH (ref 70–99)
Potassium: 4.7 mmol/L (ref 3.5–5.1)
Sodium: 137 mmol/L (ref 135–145)
Total Bilirubin: 1.4 mg/dL — ABNORMAL HIGH (ref 0.3–1.2)
Total Protein: 7.1 g/dL (ref 6.5–8.1)

## 2020-04-27 LAB — CBC WITH DIFFERENTIAL/PLATELET
Abs Immature Granulocytes: 0.04 10*3/uL (ref 0.00–0.07)
Basophils Absolute: 0 10*3/uL (ref 0.0–0.1)
Basophils Relative: 0 %
Eosinophils Absolute: 0.1 10*3/uL (ref 0.0–0.5)
Eosinophils Relative: 2 %
HCT: 35.7 % — ABNORMAL LOW (ref 39.0–52.0)
Hemoglobin: 12.7 g/dL — ABNORMAL LOW (ref 13.0–17.0)
Immature Granulocytes: 1 %
Lymphocytes Relative: 15 %
Lymphs Abs: 1.1 10*3/uL (ref 0.7–4.0)
MCH: 33.9 pg (ref 26.0–34.0)
MCHC: 35.6 g/dL (ref 30.0–36.0)
MCV: 95.2 fL (ref 80.0–100.0)
Monocytes Absolute: 0.6 10*3/uL (ref 0.1–1.0)
Monocytes Relative: 9 %
Neutro Abs: 5.1 10*3/uL (ref 1.7–7.7)
Neutrophils Relative %: 73 %
Platelets: 184 10*3/uL (ref 150–400)
RBC: 3.75 MIL/uL — ABNORMAL LOW (ref 4.22–5.81)
RDW: 13.2 % (ref 11.5–15.5)
WBC: 7 10*3/uL (ref 4.0–10.5)
nRBC: 0 % (ref 0.0–0.2)

## 2020-04-27 LAB — PSA: Prostatic Specific Antigen: 0.07 ng/mL (ref 0.00–4.00)

## 2020-04-27 NOTE — Progress Notes (Signed)
Patient here for follow up. Pt reports he had some lip swelling about 6 weeks ago, he thinks it might be related to [place].

## 2020-04-27 NOTE — Progress Notes (Signed)
Hematology/Oncology follow up note [facility] [phone] [phone]   Patient Care Team: Cletis Athens, MD as PCP - General (Internal Medicine) Noreene Filbert, MD as Radiation Oncologist (Radiation Oncology)  REFERRING PROVIDER: Cletis Athens, MD  CHIEF COMPLAINTS/REASON FOR VISIT:  Follow-up for prostate cancer  HISTORY OF PRESENTING ILLNESS:   Mathew Martinez is a  86 y.o.  male with PMH listed below was seen in consultation at the request of  Cletis Athens, MD  for evaluation of prostate cancer Patient was seen by urologist Dr. Bernardo Heater in February for evaluation of elevated PSA.  PSA drawn in November 2020 was elevated at 11.4.  No previous PSA results available.  Per patient, PSA typically fluctuates between 4 and 7 in the past. Also reports remote prostate biopsy and prostate surgery many years ago.  Patient denies any lower urinary tract symptoms.  A repeat PSA was drawn at Dr. Clydene Laming office and that level increased to 15. 12/15/2019, patient underwent prostate biopsy Pathology report was scanned in epic. Left prostate biopsies showed benign pathology. Right base, right mid, right lateral base, right lateral mid, right lateral apex biopsies were positive for adenocarcinoma. 4 out of the 5 biopsies have Gleason score 7 (4+3), right apex has Gleason score 8 (4+4).  Patient was recommended by urologist to complete staging images.  Patient prefers to be referred to cancer center for counseling and management.  Today patient was accompanied by his wife.  He reports feeling well at baseline.  No urinary symptoms. Denies any fever, chills, unintentional weight loss, abdominal pain, bone pain. He is quite active at baseline.  He does push-up exercise.  -01/12/2020 CT chest abdomen pelvis  1.8 cm right lower lobe pulmonary nodule, metastatic lung disease versus primary lung cancer.  Otherwise no potential findings of metastatic disease in the  chest abdomen or pelvis.  Patient has three-vessel coronary office marked diffuse colonic diverticulosis.  Emphysema - PET Rectal normal lung nodule has very low FDG activity on PET scan, likely not metastatic prostate cancer. Low-grade primary lung cancer-ie, adenocarcinoma cannot be ruled out.  Patient has history of tobacco use   Patient has life expectancy> 5 years,I recommend radiation plus androgen deprivation therapy for 1-3 years.  Rationale and potential side effects of androgen deprivation therapy were discussed with patient. . INTERVAL HISTORY Mathew Martinez is a 86 y.o. male who has above history reviewed by me today presents for follow up visit for management of prostate cancer Problems and complaints are listed below: Patient has finished prostate radiation for high risk prostate cancer, Grade group 4/Gleason 8 (4+4), PSA 15 He also has 1.8 cm right lower lobe pulmonary nodule which is being watched, with possible plan of SBRT Patient has received 2 doses of Firmagon with last dose of Firmagon on 02/18/2020. He then was switched to [name] on 03/17/2020.  Due to have next [name] in September. Patient reports that he developed lip swelling about 6 weeks ago, denies any shortness of breath or tongue swelling.  He thinks the swelling may be secondary to [place]. Patient is here for evaluation of lip swelling.  Review of Systems  Constitutional: Negative for appetite change, chills, fatigue, fever and unexpected weight change.  HENT:   Negative for hearing loss and voice change.        Lip swelling  Eyes: Negative for eye problems and icterus.  Respiratory: Negative for chest tightness, cough and shortness of breath.   Cardiovascular: Negative for chest pain and  leg swelling.  Gastrointestinal: Negative for abdominal distention and abdominal pain.  Endocrine: Negative for hot flashes.  Genitourinary: Negative for difficulty urinating, dysuria and frequency.   Musculoskeletal:  Negative for arthralgias.  Skin: Negative for itching and rash.  Neurological: Negative for light-headedness and numbness.  Hematological: Negative for adenopathy. Does not bruise/bleed easily.  Psychiatric/Behavioral: Negative for confusion ( ).    MEDICAL HISTORY:  Past Medical History:  Diagnosis Date  . Arthritis    wrist  . BPH (benign prostatic hyperplasia)   . Hard of hearing   . Heart murmur   . History of basal cell carcinoma   . History of dysplastic nevus   . History of melanoma 01/14/2013   left upper back  . Hyperlipemia   . Hypertension   . Prostate cancer ([place]) 12/29/2019  . Prostate cancer ([place]) 12/29/2019    SURGICAL HISTORY: Past Surgical History:  Procedure Laterality Date  . APPENDECTOMY    . CATARACT EXTRACTION W/PHACO Right 03/19/2018   Procedure: CATARACT EXTRACTION PHACO AND INTRAOCULAR LENS PLACEMENT ([place]) COMPLICATED RIGHT;  Surgeon: Leandrew Koyanagi, MD;  Location: [facility];  Service: Ophthalmology;  Laterality: Right;  [place]    . COLONOSCOPY WITH PROPOFOL N/A 07/15/2015   Procedure: COLONOSCOPY WITH PROPOFOL;  Surgeon: Josefine Class, MD;  Location: [facility] ENDOSCOPY;  Service: Endoscopy;  Laterality: N/A;  . Duodenojejunostomy    . HERNIA REPAIR    . prostate tissue removal  1996    SOCIAL HISTORY: Social History   Socioeconomic History  . Marital status: Married    Spouse name: Not on file  . Number of children: Not on file  . Years of education: Not on file  . Highest education level: Not on file  Occupational History  . Occupation: retired  Tobacco Use  . Smoking status: Former Smoker    Packs/day: 0.50    Years: 50.00    Pack years: 25.00    Quit date: 12/29/1999    Years since quitting: 20.3  . Smokeless tobacco: Never Used  Vaping Use  . Vaping Use: Never used  Substance and Sexual Activity  . Alcohol use: Never  . Drug use: Never  . Sexual activity: Not on file  Other Topics Concern  .  Not on file  Social History Narrative  . Not on file   Social Determinants of Health   Financial Resource Strain:   . Difficulty of Paying Living Expenses:   Food Insecurity:   . Worried About Charity fundraiser in the Last Year:   . Arboriculturist in the Last Year:   Transportation Needs:   . Film/video editor (Medical):   [name] Lack of Transportation (Non-Medical):   Physical Activity:   . Days of Exercise per Week:   . Minutes of Exercise per Session:   Stress:   . Feeling of Stress :   Social Connections:   . Frequency of Communication with Friends and Family:   . Frequency of Social Gatherings with Friends and Family:   . Attends Religious Services:   . Active Member of Clubs or Organizations:   . Attends Archivist Meetings:   [name] Marital Status:   Intimate Partner Violence:   . Fear of Current or Ex-Partner:   . Emotionally Abused:   [name] Physically Abused:   . Sexually Abused:     FAMILY HISTORY: Family History  Problem Relation Age of Onset  . Heart disease Mother   .  Diabetes Mother   . Cancer Father   . Prostate cancer Brother     ALLERGIES:  has No Known Allergies.  MEDICATIONS:  Current Outpatient Medications  Medication Sig Dispense Refill  . b complex vitamins capsule Take 1 capsule by mouth daily.    . Calcium Carbonate-Vit D-Min (CALCIUM 1200 PO) Take 600 mg by mouth.    . Cholecalciferol (D3-1000) 1000 units tablet Take 1,000 Units by mouth daily.    [name] co-enzyme Q-10 30 MG capsule Take 30 mg by mouth daily.    . ferrous sulfate 325 (65 FE) MG tablet Take 65 mg by mouth daily with breakfast.     . lisinopril (PRINIVIL,ZESTRIL) 40 MG tablet Take 40 mg by mouth daily.    [name] lovastatin (MEVACOR) 20 MG tablet Take 1 tablet (20 mg total) by mouth at bedtime. 90 tablet 3  . metoprolol succinate (TOPROL-XL) 25 MG 24 hr tablet Take 1 tablet (25 mg total) by mouth daily. 90 tablet 3  . Multiple Vitamins-Minerals (MULTIVITAMIN WITH MINERALS) tablet  Take 1 tablet by mouth daily.    . Omega-3 Fatty Acids (FISH OIL) 500 MG CAPS Take 500 mg by mouth.    . TURMERIC CURCUMIN PO Take 550 mg by mouth.    . vitamin C (ASCORBIC ACID) 500 MG tablet Take 500 mg by mouth daily.     No current facility-administered medications for this visit.     PHYSICAL EXAMINATION: ECOG PERFORMANCE STATUS: 0 - Asymptomatic Vitals:   04/27/20 1036  BP: 126/62  Pulse: 65  Resp: 18  Temp: (!) 96.8 F (36 C)   Filed Weights   04/27/20 1036  Weight: 139 lb 14.4 oz (63.5 kg)    Physical Exam Constitutional:      General: He is not in acute distress. HENT:     Head: Normocephalic and atraumatic.     Mouth/Throat:     Comments: Mild lip swelling, no tongue swelling Eyes:     General: No scleral icterus. Cardiovascular:     Rate and Rhythm: Normal rate and regular rhythm.     Heart sounds: Normal heart sounds.  Pulmonary:     Effort: Pulmonary effort is normal. No respiratory distress.     Breath sounds: No wheezing.  Abdominal:     General: Bowel sounds are normal. There is no distension.     Palpations: Abdomen is soft.  Musculoskeletal:        General: No deformity. Normal range of motion.     Cervical back: Normal range of motion and neck supple.  Skin:    General: Skin is warm and dry.     Findings: No erythema or rash.  Neurological:     Mental Status: He is alert and oriented to person, place, and time. Mental status is at baseline.     Cranial Nerves: No cranial nerve deficit.     Coordination: Coordination normal.  Psychiatric:        Mood and Affect: Mood normal.       LABORATORY DATA:  I have reviewed the data as listed Lab Results  Component Value Date   WBC 7.0 04/27/2020   HGB 12.7 (L) 04/27/2020   HCT 35.7 (L) 04/27/2020   MCV 95.2 04/27/2020   PLT 184 04/27/2020   Recent Labs    12/29/19 1049 12/29/19 1050 02/18/20 0936 04/27/20 1004  NA 132*  --  132* 137  K 4.9  --  4.3 4.7  CL 97*  --  98  101  CO2 24   --  25 27  GLUCOSE 115*  --  129* 125*  BUN 16  --  18 25*  CREATININE 0.86  --  0.91 1.13  CALCIUM 9.2  --  8.8* 9.0  GFRNONAA >60  --  >60 58*  GFRAA >60  --  >60 >60  PROT 7.4  --  6.9 7.1  ALBUMIN 4.5  --  4.0 4.1  AST 21  --  24 31  ALT 22  --  26 36  ALKPHOS 66  --  69 69  BILITOT 2.3*  --  1.4* 1.4*  BILIDIR  --  0.3*  --   --    Iron/TIBC/Ferritin/ %Sat No results found for: IRON, TIBC, FERRITIN, IRONPCTSAT    RADIOGRAPHIC STUDIES: I have personally reviewed the radiological images as listed and agreed with the findings in the report. No results found.    ASSESSMENT & PLAN:  1. Angioedema of lips, initial encounter   2. Lung nodule   3. Prostate cancer ([place])   T2bN0M0 prostate cancer was discussed. #Risk stratification Grade group 4/Gleason 8 (4+4), PSA 15,  high risk group.   Finished radiation and recommend to continue androgen deprivation therapy.   Continue Eligard and the next dose is due in September 2021  Lip angioedema, etiology unclear. Patient feels that may be secondary to [place] however the last Firmagon dose was on 01/21/2020.  I discussed with patient that it is unusual that his angioedema continues to be symptomatic 2 months after last dose of Firmagon. Reviewed patient's medication list, patient has been on lisinopril.  Although most lisinopril induced angioedema happens during the first week of the therapy, there are cases when angioedema starts after years of antibiotic therapy.  I encourage patient to continue use Benadryl as needed if symptoms are worse.  I encourage patient to discuss with primary care provider for the possibility of lisinopril causing angioedema.  He agrees with the plan. He is made aware that if he develops shortness of breath or tongue swelling he needs to go to emergency room right away. #Lung nodule, cannot rule out underlying malignancy.  Patient follows with radiation oncology and he is due for repeat CT scan prior to the  decision of proceeding with SBRT. #Elevated BUN, normal creatinine, creatinine level is slightly higher than last visit.  I encourage patient to increase oral hydration.   Orders Placed This Encounter  Procedures  . Comprehensive metabolic panel    Standing Status:   Future    Standing Expiration Date:   04/27/2021  . CBC with Differential/Platelet    Standing Status:   Future    Standing Expiration Date:   04/27/2021  . PSA    Standing Status:   Future    Standing Expiration Date:   04/27/2021    All questions were answered. The patient knows to call the clinic with any problems questions or concerns.   Return of visit: September 2021 Earlie Server, MD, PhD Hematology Oncology [facility] Pager- [phone] 04/27/2020

## 2020-05-02 ENCOUNTER — Encounter: Payer: Self-pay | Admitting: Internal Medicine

## 2020-05-02 ENCOUNTER — Ambulatory Visit (INDEPENDENT_AMBULATORY_CARE_PROVIDER_SITE_OTHER): Payer: Medicare HMO | Admitting: Internal Medicine

## 2020-05-02 VITALS — BP 118/67 | HR 78 | Ht 69.0 in | Wt 139.1 lb

## 2020-05-02 DIAGNOSIS — C61 Malignant neoplasm of prostate: Secondary | ICD-10-CM | POA: Diagnosis not present

## 2020-05-02 DIAGNOSIS — T783XXA Angioneurotic edema, initial encounter: Secondary | ICD-10-CM | POA: Diagnosis not present

## 2020-05-02 DIAGNOSIS — E7849 Other hyperlipidemia: Secondary | ICD-10-CM | POA: Diagnosis not present

## 2020-05-02 DIAGNOSIS — Z7189 Other specified counseling: Secondary | ICD-10-CM | POA: Diagnosis not present

## 2020-05-02 DIAGNOSIS — I10 Essential (primary) hypertension: Secondary | ICD-10-CM

## 2020-05-02 MED ORDER — LORATADINE 10 MG PO TABS: 10.0000 mg | ORAL_TABLET | Freq: Every day | ORAL | 11 refills | Status: DC

## 2020-05-02 NOTE — Assessment & Plan Note (Signed)
He was diagnosed with adenocarcinoma of the prostate on 12/15/2019 (T2N0M0), with 4 of 5 biopsies with a Gleason score of 7 (4+3), and one with a Gleason score of 8 (4+4). He is under the care of Dr. Earlie Server in Oncology and Dr. Berton Mount in Radiation Oncology. He is being treated with [name] currently. He went through Radiation and was formerly treated with [place].

## 2020-05-02 NOTE — Assessment & Plan Note (Signed)
Stop lisinopril.

## 2020-05-02 NOTE — Progress Notes (Signed)
Established Patient Office Visit  SUBJECTIVE:  Subjective  Patient ID: Mathew Martinez, male    DOB: [birth year]  Age: 86 y.o. MRN: [id number]  CC:  Chief Complaint  Patient presents with  . Facial Swelling    Pt complains of lip swelling for the past 7 weeks    HPI Mathew Martinez is a 86 y.o. male presenting today for an evaluation of his facial swelling.   He notes this his lip has been swollen for the last seven weeks.   He went to see urology for an elevated PSA; he met with Dr. Bernardo Heater at [facility], and he underwent a prostate biopsy on 12/15/2019 they found adenocarcinoma T2N0M0, 4 of the 5 biopsies with a Gleason score of 7, and one biopsy with a Gleason score of 8. He was referred to oncology under Dr. Earlie Server. He underwent radiation treatment with Dr. Berton Mount.  He was treated with [name] and then switched to [name] in 02/2020.   He follows up with Dr. Donella Stade on 05/12/2020 and Dr. Tasia Catchings on 07/20/2020.   He had some symptomatic anemia due to treatment, but he has been taking iron and he has been stable.  Past Medical History:  Diagnosis Date  . Arthritis    wrist  . BPH (benign prostatic hyperplasia)   . Hard of hearing   . Heart murmur   . History of basal cell carcinoma   . History of dysplastic nevus   . History of melanoma 01/14/2013   left upper back  . Hyperlipemia   . Hypertension   . Prostate cancer ([place]) 12/29/2019  . Prostate cancer ([place]) 12/29/2019    Past Surgical History:  Procedure Laterality Date  . APPENDECTOMY    . CATARACT EXTRACTION W/PHACO Right 03/19/2018   Procedure: CATARACT EXTRACTION PHACO AND INTRAOCULAR LENS PLACEMENT ([place]) COMPLICATED RIGHT;  Surgeon: Leandrew Koyanagi, MD;  Location: [facility];  Service: Ophthalmology;  Laterality: Right;  [place]    . COLONOSCOPY WITH PROPOFOL N/A 07/15/2015   Procedure: COLONOSCOPY WITH PROPOFOL;  Surgeon: Josefine Class, MD;   Location: [facility] ENDOSCOPY;  Service: Endoscopy;  Laterality: N/A;  . Duodenojejunostomy    . HERNIA REPAIR    . prostate tissue removal  1996    Family History  Problem Relation Age of Onset  . Heart disease Mother   . Diabetes Mother   . Cancer Father   . Prostate cancer Brother     Social History   Socioeconomic History  . Marital status: Married    Spouse name: Not on file  . Number of children: Not on file  . Years of education: Not on file  . Highest education level: Not on file  Occupational History  . Occupation: retired  Tobacco Use  . Smoking status: Former Smoker    Packs/day: 0.50    Years: 50.00    Pack years: 25.00    Quit date: 12/29/1999    Years since quitting: 20.3  . Smokeless tobacco: Never Used  Vaping Use  . Vaping Use: Never used  Substance and Sexual Activity  . Alcohol use: Never  . Drug use: Never  . Sexual activity: Not on file  Other Topics Concern  . Not on file  Social History Narrative  . Not on file   Social Determinants of Health   Financial Resource Strain:   . Difficulty of Paying Living Expenses:   Food Insecurity:   . Worried About Estate manager/land agent  of Food in the Last Year:   . [place] in the Last Year:   Transportation Needs:   . Lack of Transportation (Medical):   [name] Lack of Transportation (Non-Medical):   Physical Activity:   . Days of Exercise per Week:   . Minutes of Exercise per Session:   Stress:   . Feeling of Stress :   Social Connections:   . Frequency of Communication with Friends and Family:   . Frequency of Social Gatherings with Friends and Family:   . Attends Religious Services:   . Active Member of Clubs or Organizations:   . Attends Archivist Meetings:   [name] Marital Status:   Intimate Partner Violence:   . Fear of Current or Ex-Partner:   . Emotionally Abused:   [name] Physically Abused:   . Sexually Abused:      Current Outpatient Medications:  .  b complex vitamins capsule, Take 1  capsule by mouth daily., Disp: , Rfl:  .  Calcium Carbonate-Vit D-Min (CALCIUM 1200 PO), Take 600 mg by mouth., Disp: , Rfl:  .  Cholecalciferol (D3-1000) 1000 units tablet, Take 1,000 Units by mouth daily., Disp: , Rfl:  .  co-enzyme Q-10 30 MG capsule, Take 30 mg by mouth daily., Disp: , Rfl:  .  ferrous sulfate 325 (65 FE) MG tablet, Take 65 mg by mouth daily with breakfast. , Disp: , Rfl:  .  lisinopril (PRINIVIL,ZESTRIL) 40 MG tablet, Take 40 mg by mouth daily., Disp: , Rfl:  .  lovastatin (MEVACOR) 20 MG tablet, Take 1 tablet (20 mg total) by mouth at bedtime., Disp: 90 tablet, Rfl: 3 .  metoprolol succinate (TOPROL-XL) 25 MG 24 hr tablet, Take 1 tablet (25 mg total) by mouth daily., Disp: 90 tablet, Rfl: 3 .  Multiple Vitamins-Minerals (MULTIVITAMIN WITH MINERALS) tablet, Take 1 tablet by mouth daily., Disp: , Rfl:  .  Omega-3 Fatty Acids (FISH OIL) 500 MG CAPS, Take 500 mg by mouth., Disp: , Rfl:  .  TURMERIC CURCUMIN PO, Take 550 mg by mouth., Disp: , Rfl:  .  vitamin C (ASCORBIC ACID) 500 MG tablet, Take 500 mg by mouth daily., Disp: , Rfl:  .  loratadine (CLARITIN) 10 MG tablet, Take 1 tablet (10 mg total) by mouth daily., Disp: 30 tablet, Rfl: 11   No Known Allergies  ROS Review of Systems  Constitutional: Negative.  Negative for fatigue.  HENT: Positive for facial swelling (lip).   Eyes: Negative.   Respiratory: Negative.  Negative for shortness of breath.   Cardiovascular: Negative.   Gastrointestinal: Negative.   Endocrine: Negative.   Genitourinary: Negative.   Musculoskeletal: Negative.   Skin: Negative.   Allergic/Immunologic: Negative.   Neurological: Negative.  Negative for light-headedness.  Hematological: Negative.   Psychiatric/Behavioral: Negative.   All other systems reviewed and are negative.    OBJECTIVE:    Physical Exam Vitals reviewed.  Constitutional:      Appearance: Normal appearance.  Neck:     Vascular: No carotid bruit.  Cardiovascular:       Rate and Rhythm: Normal rate and regular rhythm.     Pulses: Normal pulses.     Heart sounds: Normal heart sounds.  Pulmonary:     Effort: Pulmonary effort is normal.     Breath sounds: Normal breath sounds.  Abdominal:     General: Bowel sounds are normal.     Palpations: Abdomen is soft. There is no hepatomegaly or splenomegaly.  Tenderness: There is no abdominal tenderness.     Hernia: No hernia is present.  Musculoskeletal:     Right lower leg: No edema.     Left lower leg: No edema.  Skin:    Findings: No rash.  Neurological:     Mental Status: He is alert and oriented to person, place, and time.  Psychiatric:        Mood and Affect: Mood normal.        Behavior: Behavior normal.     BP 118/67   Pulse 78   Ht 5' 9"  (1.753 m)   Wt 139 lb 1.6 oz (63.1 kg)   BMI 20.54 kg/m  Wt Readings from Last 3 Encounters:  05/02/20 139 lb 1.6 oz (63.1 kg)  04/27/20 139 lb 14.4 oz (63.5 kg)  04/11/20 137 lb 9 oz (62.4 kg)    Health Maintenance Due  Topic Date Due  . TETANUS/TDAP  Never done  . PNA vac Low Risk Adult (1 of 2 - PCV13) Never done    There are no preventive care reminders to display for this patient.  CBC Latest Ref Rng & Units 04/27/2020 03/09/2020 02/18/2020  WBC 4.0 - 10.5 K/uL 7.0 5.6 6.8  Hemoglobin 13.0 - 17.0 g/dL 12.7(L) 12.2(L) 12.6(L)  Hematocrit 39 - 52 % 35.7(L) 35.5(L) 36.5(L)  Platelets 150 - 400 K/uL 184 181 168   CMP Latest Ref Rng & Units 04/27/2020 02/18/2020 12/29/2019  Glucose 70 - 99 mg/dL 125(H) 129(H) 115(H)  BUN 8 - 23 mg/dL 25(H) 18 16  Creatinine 0.61 - 1.24 mg/dL 1.13 0.91 0.86  Sodium 135 - 145 mmol/L 137 132(L) 132(L)  Potassium 3.5 - 5.1 mmol/L 4.7 4.3 4.9  Chloride 98 - 111 mmol/L 101 98 97(L)  CO2 22 - 32 mmol/L 27 25 24   Calcium 8.9 - 10.3 mg/dL 9.0 8.8(L) 9.2  Total Protein 6.5 - 8.1 g/dL 7.1 6.9 7.4  Total Bilirubin 0.3 - 1.2 mg/dL 1.4(H) 1.4(H) 2.3(H)  Alkaline Phos 38 - 126 U/L 69 69 66  AST 15 - 41 U/L 31 24 21   ALT  0 - 44 U/L 36 26 22    Lab Results  Component Value Date   TSH 1.429 02/18/2020   Lab Results  Component Value Date   ALBUMIN 4.1 04/27/2020   ANIONGAP 9 04/27/2020   No results found for: CHOL, HDL, LDLCALC, CHOLHDL No results found for: TRIG No results found for: HGBA1C    ASSESSMENT & PLAN:   Problem List Items Addressed This Visit      Cardiovascular and Mediastinum   Essential hypertension    Stop lisinopril         Genitourinary   Prostate cancer ([place])    He was diagnosed with adenocarcinoma of the prostate on 12/15/2019 (T2N0M0), with 4 of 5 biopsies with a Gleason score of 7 (4+3), and one with a Gleason score of 8 (4+4). He is under the care of Dr. Earlie Server in Oncology and Dr. Berton Mount in Radiation Oncology. He is being treated with [name] currently. He went through Radiation and was formerly treated with [place].        Other   Hyperlipidemia    - The patient's hyperlipidemia is stable  - The patient will continue the current treatment regimen.  - I encouraged the patient to eat more vegetables and whole wheat, and to avoid fatty foods like whole milk, hard cheese, egg yolks, margarine, baked sweets, and fried foods.  - I  encouraged the patient to live an active lifestyle and complete activities for 40 minutes at least three times per week.  - I instructed the patient to go to the ER if they begin having chest pain.        Goals of care, counseling/discussion   Angio-edema - Primary    Stop lisinopril         Meds ordered this encounter  Medications  . loratadine (CLARITIN) 10 MG tablet    Sig: Take 1 tablet (10 mg total) by mouth daily.    Dispense:  30 tablet    Refill:  11      Follow-up: Return in about 2 weeks (around 05/16/2020).    Dr. Jane Canary [facility] [address] [place], [place] [address]   By signing my name below, I, [employer], attest that this documentation has been prepared under the direction  and in the presence of Cletis Athens, MD. Electronically Signed: Cletis Athens, MD 05/02/20, 11:56 AM   I personally performed the services described in this documentation, which was SCRIBED in my presence. The recorded information has been reviewed and considered accurate. It has been edited as necessary during review. Cletis Athens, MD

## 2020-05-02 NOTE — Assessment & Plan Note (Signed)
-   The patient's hyperlipidemia is stable. - The patient will continue the current treatment regimen.  - I encouraged the patient to eat more vegetables and whole wheat, and to avoid fatty foods like whole milk, hard cheese, egg yolks, margarine, baked sweets, and fried foods.  - I encouraged the patient to live an active lifestyle and complete activities for 40 minutes at least three times per week.  - I instructed the patient to go to the ER if they begin having chest pain.   

## 2020-05-04 ENCOUNTER — Other Ambulatory Visit: Payer: Self-pay

## 2020-05-04 ENCOUNTER — Ambulatory Visit
Admission: RE | Admit: 2020-05-04 | Discharge: 2020-05-04 | Disposition: A | Discharge status: Home / Self Care | Payer: Medicare HMO | Source: Ambulatory Visit | Attending: Radiation Oncology | Admitting: Radiation Oncology

## 2020-05-04 DIAGNOSIS — M47814 Spondylosis without myelopathy or radiculopathy, thoracic region: Secondary | ICD-10-CM | POA: Diagnosis not present

## 2020-05-04 DIAGNOSIS — I700 Atherosclerosis of aorta: Secondary | ICD-10-CM | POA: Diagnosis not present

## 2020-05-04 DIAGNOSIS — I2510 Atherosclerotic heart disease of native coronary artery without angina pectoris: Secondary | ICD-10-CM | POA: Diagnosis not present

## 2020-05-04 DIAGNOSIS — J432 Centrilobular emphysema: Secondary | ICD-10-CM | POA: Diagnosis not present

## 2020-05-04 DIAGNOSIS — C61 Malignant neoplasm of prostate: Secondary | ICD-10-CM | POA: Diagnosis not present

## 2020-05-04 MED ORDER — IOHEXOL 300 MG/ML  SOLN
60.0000 mL | Freq: Once | INTRAMUSCULAR | Status: AC | PRN
  Administered 2020-05-04: 60 mL via INTRAVENOUS

## 2020-05-12 ENCOUNTER — Other Ambulatory Visit: Payer: Self-pay

## 2020-05-12 ENCOUNTER — Inpatient Hospital Stay: Payer: Medicare HMO

## 2020-05-12 ENCOUNTER — Ambulatory Visit
Admission: RE | Admit: 2020-05-12 | Discharge: 2020-05-12 | Disposition: A | Discharge status: Home / Self Care | Payer: Medicare HMO | Source: Ambulatory Visit | Attending: Radiation Oncology | Admitting: Radiation Oncology

## 2020-05-12 ENCOUNTER — Encounter: Payer: Self-pay | Admitting: Radiation Oncology

## 2020-05-12 VITALS — BP 141/59 | HR 64 | Temp 96.5°F | Wt 140.8 lb

## 2020-05-12 DIAGNOSIS — Z923 Personal history of irradiation: Secondary | ICD-10-CM | POA: Diagnosis not present

## 2020-05-12 DIAGNOSIS — R911 Solitary pulmonary nodule: Secondary | ICD-10-CM | POA: Diagnosis not present

## 2020-05-12 DIAGNOSIS — C61 Malignant neoplasm of prostate: Secondary | ICD-10-CM | POA: Insufficient documentation

## 2020-05-12 NOTE — Progress Notes (Signed)
Nutrition Follow-up:  Patient with prostate cancer.  Patient has completed radiation treatments (6/1) and receiving lupron.    Met with patient in clinic for nutrition follow-up.  Patient brought in daily calorie count and patient getting between 2100-2400 calories daily.  Patient eating 3 meals per day and 2 snacks daily.  Has been drinking [employer] shake 1/2 daily.    Patient was seen by PCP and taken off of lisnopril for lip swelling.  Reports some improvement.    Patient continues to exercise as well.   Medications: reviewed  Labs: reviewed  Anthropometrics:   Weight 141 lb today in clinic increased from 137 lb 9 oz on 04/11/20. 6/16 137 lb 1 oz 6/1 135 lb  Patient wants to get to 145 lb  NUTRITION DIAGNOSIS: Inadequate oral intake improved with weight gain   INTERVENTION:  Patient to continue to monitor calorie intake and weight trends.  Patient aware lupron may cause weight gain as well.  He will monitor and adjust calorie intake if weight gain exceeds his goal.   Patient has RD contact information and will contact if needed   NEXT VISIT: no follow-up planned.  Patient to call RD if needed in the future  Mathew Martinez B. Zenia Resides, [place], [place] Registered Dietitian [phone] (mobile)

## 2020-05-12 NOTE — Progress Notes (Signed)
Radiation Oncology Follow up Note  Name: Mathew Martinez   Date:   05/12/2020 MRN:  [id number] DOB: [birth year]    This 86 y.o. 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  REFERRING PROVIDER: Cletis Athens, MD  HPI: Patient is now up approximately month having completed IMRT radiation therapy to his prostate and pelvic nodes for Gleason 8 (4+4) adenocarcinoma the prostate presenting with a PSA of 11.4.  We were also tracking a right lung nodule which has low level hypermetabolic activity on PET scan.  I repeated his CT scan.  Showing interval stability of the 1.8 cm pulmonary nodule.  Given the significant absence of uptake on PET CT this is probably benign lesion.  From a urologic standpoint he is doing well specifically denies marked him worsening of lower urinary tract symptoms diarrhea or fatigue.  His most recent PSA is 0.07 he is currently on androgen deprivation therapy.  COMPLICATIONS OF TREATMENT: none  FOLLOW UP COMPLIANCE: keeps appointments   PHYSICAL EXAM:  BP (!) 141/59 (BP Location: Left Arm, Patient Position: Sitting, Cuff Size: Normal)   Pulse 64   Temp (!) 96.5 F (35.8 C) (Tympanic)   Wt 140 lb 12.8 oz (63.9 kg)   SpO2 99%   BMI 20.79 kg/m  Well-developed well-nourished patient in NAD. HEENT reveals PERLA, EOMI, discs not visualized.  Oral cavity is clear. No oral mucosal lesions are identified. Neck is clear without evidence of cervical or supraclavicular adenopathy. Lungs are clear to A&P. Cardiac examination is essentially unremarkable with regular rate and rhythm without murmur rub or thrill. Abdomen is benign with no organomegaly or masses noted. Motor sensory and DTR levels are equal and symmetric in the upper and lower extremities. Cranial nerves II through XII are grossly intact. Proprioception is intact. No  peripheral adenopathy or edema is identified. No motor or sensory levels are noted. Crude visual fields are within normal range.  RADIOLOGY RESULTS: CT scans of the chest reviewed compatible with above-stated findings  PLAN: At the present time I will opt not to treat this lesion I believe it is either benign or ask certainly a low level adenocarcinoma.  I have asked to see him back in 6 months with a repeat CT scan at that time.  If that when shows no progression in the nodule with go to once yearly follow-up CT scans.  Patient is comfortable with my treatment recommendations.  I would like to take this opportunity to thank you for allowing me to participate in the care of your patient.Noreene Filbert, MD

## 2020-05-16 ENCOUNTER — Encounter: Payer: Self-pay | Admitting: Internal Medicine

## 2020-05-16 ENCOUNTER — Ambulatory Visit (INDEPENDENT_AMBULATORY_CARE_PROVIDER_SITE_OTHER): Payer: Medicare HMO | Admitting: Internal Medicine

## 2020-05-16 ENCOUNTER — Other Ambulatory Visit: Payer: Self-pay

## 2020-05-16 VITALS — BP 141/75 | HR 75 | Wt 141.5 lb

## 2020-05-16 DIAGNOSIS — I10 Essential (primary) hypertension: Secondary | ICD-10-CM

## 2020-05-16 DIAGNOSIS — T783XXD Angioneurotic edema, subsequent encounter: Secondary | ICD-10-CM

## 2020-05-16 DIAGNOSIS — Z8546 Personal history of malignant neoplasm of prostate: Secondary | ICD-10-CM | POA: Insufficient documentation

## 2020-05-16 DIAGNOSIS — E7849 Other hyperlipidemia: Secondary | ICD-10-CM

## 2020-05-16 MED ORDER — AMLODIPINE BESYLATE 2.5 MG PO TABS: 2.5000 mg | ORAL_TABLET | Freq: Every day | ORAL | 3 refills | Status: DC

## 2020-05-16 NOTE — Assessment & Plan Note (Signed)
stable °

## 2020-05-16 NOTE — Assessment & Plan Note (Addendum)
Stable.  On statin. 

## 2020-05-16 NOTE — Progress Notes (Signed)
Established Patient Office Visit  Subjective:  Patient ID: Mathew Martinez, male    DOB: May 21, 1931  Age: 86 y.o. MRN: [id number]  CC:  Chief Complaint  Patient presents with  . Facial Swelling    Patient is here for a 2 week follow up on his lip swelling. Patient states he noticies improvement, and they feel better    Hyperlipidemia  Allergic Reaction This is a recurrent problem. The current episode started more than 1 week ago. The problem occurs intermittently. The problem is mild. The patient was exposed to a prescription drug. Pertinent negatives include no diarrhea. Swelling is present on the lips. Past treatments include one or more OTC medications. The treatment provided moderate relief. There is no history of asthma.    Mathew Martinez presents forswelling  Of lips  Past Medical History:  Diagnosis Date  . Arthritis    wrist  . BPH (benign prostatic hyperplasia)   . Hard of hearing   . Heart murmur   . History of basal cell carcinoma   . History of dysplastic nevus   . History of melanoma 01/14/2013   left upper back  . Hyperlipemia   . Hypertension   . Prostate cancer ([place]) 12/29/2019    Past Surgical History:  Procedure Laterality Date  . APPENDECTOMY    . CATARACT EXTRACTION W/PHACO Right 03/19/2018   Procedure: CATARACT EXTRACTION PHACO AND INTRAOCULAR LENS PLACEMENT ([place]) COMPLICATED RIGHT;  Surgeon: Leandrew Koyanagi, MD;  Location: [facility];  Service: Ophthalmology;  Laterality: Right;  [place]    . COLONOSCOPY WITH PROPOFOL N/A 07/15/2015   Procedure: COLONOSCOPY WITH PROPOFOL;  Surgeon: Josefine Class, MD;  Location: [facility] ENDOSCOPY;  Service: Endoscopy;  Laterality: N/A;  . Duodenojejunostomy    . HERNIA REPAIR    . prostate tissue removal  1996    Family History  Problem Relation Age of Onset  . Heart disease Mother   . Diabetes Mother   . Cancer Father   . Prostate cancer Brother     Social History    Socioeconomic History  . Marital status: Married    Spouse name: Not on file  . Number of children: Not on file  . Years of education: Not on file  . Highest education level: Not on file  Occupational History  . Occupation: retired  Tobacco Use  . Smoking status: Former Smoker    Packs/day: 0.50    Years: 50.00    Pack years: 25.00    Quit date: 12/29/1999    Years since quitting: 20.3  . Smokeless tobacco: Never Used  Vaping Use  . Vaping Use: Never used  Substance and Sexual Activity  . Alcohol use: Never  . Drug use: Never  . Sexual activity: Not on file  Other Topics Concern  . Not on file  Social History Narrative  . Not on file   Social Determinants of Health   Financial Resource Strain:   . Difficulty of Paying Living Expenses:   Food Insecurity:   . Worried About Charity fundraiser in the Last Year:   . Arboriculturist in the Last Year:   Transportation Needs:   . Film/video editor (Medical):   [name] Lack of Transportation (Non-Medical):   Physical Activity:   . Days of Exercise per Week:   . Minutes of Exercise per Session:   Stress:   . Feeling of Stress :   Social Connections:   .  Frequency of Communication with Friends and Family:   . Frequency of Social Gatherings with Friends and Family:   . Attends Religious Services:   . Active Member of Clubs or Organizations:   . Attends Archivist Meetings:   [name] Marital Status:   Intimate Partner Violence:   . Fear of Current or Ex-Partner:   . Emotionally Abused:   [name] Physically Abused:   . Sexually Abused:      Current Outpatient Medications:  .  b complex vitamins capsule, Take 1 capsule by mouth daily., Disp: , Rfl:  .  Calcium Carbonate-Vit D-Min (CALCIUM 1200 PO), Take 600 mg by mouth., Disp: , Rfl:  .  Cholecalciferol (D3-1000) 1000 units tablet, Take 1,000 Units by mouth daily., Disp: , Rfl:  .  co-enzyme Q-10 30 MG capsule, Take 30 mg by mouth daily., Disp: , Rfl:  .  ferrous  sulfate 325 (65 FE) MG tablet, Take 65 mg by mouth daily with breakfast. , Disp: , Rfl:  .  loratadine (CLARITIN) 10 MG tablet, Take 1 tablet (10 mg total) by mouth daily., Disp: 30 tablet, Rfl: 11 .  lovastatin (MEVACOR) 20 MG tablet, Take 1 tablet (20 mg total) by mouth at bedtime., Disp: 90 tablet, Rfl: 3 .  metoprolol succinate (TOPROL-XL) 25 MG 24 hr tablet, Take 1 tablet (25 mg total) by mouth daily., Disp: 90 tablet, Rfl: 3 .  Multiple Vitamins-Minerals (MULTIVITAMIN WITH MINERALS) tablet, Take 1 tablet by mouth daily., Disp: , Rfl:  .  Omega-3 Fatty Acids (FISH OIL) 500 MG CAPS, Take 500 mg by mouth., Disp: , Rfl:  .  TURMERIC CURCUMIN PO, Take 550 mg by mouth., Disp: , Rfl:  .  vitamin C (ASCORBIC ACID) 500 MG tablet, Take 500 mg by mouth daily., Disp: , Rfl:  .  amLODipine (NORVASC) 2.5 MG tablet, Take 1 tablet (2.5 mg total) by mouth daily., Disp: 90 tablet, Rfl: 3   Allergies  Allergen Reactions  . Lisinopril Other (See Comments)    Face Swelling    ROS Review of Systems  Constitutional: Negative.   HENT: Negative.        Lip swelling   Eyes: Negative.   Respiratory: Negative.   Cardiovascular: Negative.   Gastrointestinal: Negative.  Negative for diarrhea.  Endocrine: Negative.   Genitourinary: Negative.   Musculoskeletal: Negative.   Skin: Negative.   Allergic/Immunologic: Negative.   Neurological: Negative.   Hematological: Negative.   Psychiatric/Behavioral: Negative.   All other systems reviewed and are negative.     Objective:    Physical Exam Vitals reviewed.  Constitutional:      Appearance: Normal appearance.  HENT:     Mouth/Throat:     Mouth: Mucous membranes are moist.     Comments: Lower lip swelling Eyes:     Pupils: Pupils are equal, round, and reactive to light.  Neck:     Vascular: No carotid bruit.  Cardiovascular:     Rate and Rhythm: Normal rate and regular rhythm.     Pulses: Normal pulses.     Heart sounds: Normal heart sounds.   Pulmonary:     Effort: Pulmonary effort is normal.     Breath sounds: Normal breath sounds.  Abdominal:     General: Bowel sounds are normal.     Palpations: Abdomen is soft. There is no hepatomegaly, splenomegaly or mass.     Tenderness: There is no abdominal tenderness.     Hernia: No hernia is present.  Musculoskeletal:  Cervical back: Neck supple.     Right lower leg: No edema.     Left lower leg: No edema.  Skin:    Findings: No rash.  Neurological:     Mental Status: He is alert and oriented to person, place, and time.     Motor: No weakness.  Psychiatric:        Mood and Affect: Mood normal.        Behavior: Behavior normal.     BP (!) 141/75   Pulse 75   Wt 141 lb 8 oz (64.2 kg)   BMI 20.90 kg/m  Wt Readings from Last 3 Encounters:  05/16/20 141 lb 8 oz (64.2 kg)  05/12/20 140 lb 12.8 oz (63.9 kg)  05/12/20 141 lb (64 kg)     Health Maintenance Due  Topic Date Due  . TETANUS/TDAP  Never done  . PNA vac Low Risk Adult (1 of 2 - PCV13) Never done    There are no preventive care reminders to display for this patient.  Lab Results  Component Value Date   TSH 1.429 02/18/2020   Lab Results  Component Value Date   WBC 7.0 04/27/2020   HGB 12.7 (L) 04/27/2020   HCT 35.7 (L) 04/27/2020   MCV 95.2 04/27/2020   PLT 184 04/27/2020   Lab Results  Component Value Date   NA 137 04/27/2020   K 4.7 04/27/2020   CO2 27 04/27/2020   GLUCOSE 125 (H) 04/27/2020   BUN 25 (H) 04/27/2020   CREATININE 1.13 04/27/2020   BILITOT 1.4 (H) 04/27/2020   ALKPHOS 69 04/27/2020   AST 31 04/27/2020   ALT 36 04/27/2020   PROT 7.1 04/27/2020   ALBUMIN 4.1 04/27/2020   CALCIUM 9.0 04/27/2020   ANIONGAP 9 04/27/2020   No results found for: CHOL No results found for: HDL No results found for: LDLCALC No results found for: TRIG No results found for: CHOLHDL No results found for: HGBA1C    Assessment & Plan:   Problem List Items Addressed This Visit       Cardiovascular and Mediastinum   Essential hypertension - Primary    Stop ace inhibitor      Relevant Medications   amLODipine (NORVASC) 2.5 MG tablet     Other   Hyperlipidemia    Stable  On statin      Relevant Medications   amLODipine (NORVASC) 2.5 MG tablet   Angio-edema    Stop ace   Will start on amlodapine      H/O prostate cancer    stable         Meds ordered this encounter  Medications  . amLODipine (NORVASC) 2.5 MG tablet    Sig: Take 1 tablet (2.5 mg total) by mouth daily.    Dispense:  90 tablet    Refill:  3    Follow-up: No follow-ups on file. sto Ace inhibitor  And start on amlodipine  2.5 mg po daily    Cletis Athens, MD

## 2020-05-16 NOTE — Assessment & Plan Note (Signed)
Stop ace inhibitor

## 2020-05-16 NOTE — Assessment & Plan Note (Signed)
Stop ace   Will start on amlodapine

## 2020-05-18 NOTE — Telephone Encounter (Signed)
Created in error

## 2020-05-19 ENCOUNTER — Other Ambulatory Visit: Payer: Self-pay | Admitting: *Deleted

## 2020-05-19 MED ORDER — DOXAZOSIN MESYLATE 4 MG PO TABS
4.0000 mg | ORAL_TABLET | Freq: Every day | ORAL | 6 refills | Status: DC
Start: 2020-05-19 — End: 2020-11-29

## 2020-06-16 ENCOUNTER — Ambulatory Visit: Payer: Medicare HMO | Admitting: Internal Medicine

## 2020-06-30 ENCOUNTER — Encounter: Payer: Self-pay | Admitting: Internal Medicine

## 2020-06-30 ENCOUNTER — Other Ambulatory Visit: Payer: Self-pay

## 2020-06-30 ENCOUNTER — Ambulatory Visit (INDEPENDENT_AMBULATORY_CARE_PROVIDER_SITE_OTHER): Payer: Medicare HMO | Admitting: Internal Medicine

## 2020-06-30 VITALS — BP 125/65 | HR 71 | Ht 69.0 in | Wt 145.6 lb

## 2020-06-30 DIAGNOSIS — Z8546 Personal history of malignant neoplasm of prostate: Secondary | ICD-10-CM | POA: Diagnosis not present

## 2020-06-30 DIAGNOSIS — E7849 Other hyperlipidemia: Secondary | ICD-10-CM

## 2020-06-30 DIAGNOSIS — I10 Essential (primary) hypertension: Secondary | ICD-10-CM

## 2020-06-30 DIAGNOSIS — T783XXD Angioneurotic edema, subsequent encounter: Secondary | ICD-10-CM

## 2020-06-30 DIAGNOSIS — Z0000 Encounter for general adult medical examination without abnormal findings: Secondary | ICD-10-CM | POA: Diagnosis not present

## 2020-06-30 NOTE — Assessment & Plan Note (Signed)
Patient lip swelling has gone down.

## 2020-06-30 NOTE — Assessment & Plan Note (Signed)
His PSA Is 0.07.  Patient is not having any symptoms right now.

## 2020-06-30 NOTE — Assessment & Plan Note (Signed)
Patient is known to have prostate cancer.  He received radiation treatment and has also been taking [name] shots.  He complains of tiredness and fatigue.  He denies any chest pain shortness of breath.  He is regularly being followed up by the radiologist and the cancer doctor.  His physical examination is unremarkable chest is clear heart is regular abdomen is soft nontender and there is there is 2+ pedal edema.

## 2020-06-30 NOTE — Progress Notes (Signed)
Established Patient Office Visit  SUBJECTIVE:  Subjective  Patient ID: Mathew Martinez, male    DOB: [birth year]  Age: 86 y.o. MRN: [id number]  CC:  Chief Complaint  Patient presents with  . Annual Exam    HPI Mathew Martinez is a 86 y.o. male presenting today for an annual exam.  He notes that he feels fatigued a lot due to his [name]. He is also having some mild incontinence. He underwent 40 treatments of radiation therapy with Dr. Donella Stade. He is followed closely by his oncologist, Dr. Tasia Catchings.   He does have some mild nausea.   His blood pressure today is 125/65. He is taking his blood pressure medication as directed and without any complications. He denies any missed doses.     Past Medical History:  Diagnosis Date  . Arthritis    wrist  . BPH (benign prostatic hyperplasia)   . Hard of hearing   . Heart murmur   . History of basal cell carcinoma   . History of dysplastic nevus   . History of melanoma 01/14/2013   left upper back  . Hyperlipemia   . Hypertension   . Prostate cancer ([place]) 12/29/2019    Past Surgical History:  Procedure Laterality Date  . APPENDECTOMY    . CATARACT EXTRACTION W/PHACO Right 03/19/2018   Procedure: CATARACT EXTRACTION PHACO AND INTRAOCULAR LENS PLACEMENT ([place]) COMPLICATED RIGHT;  Surgeon: Leandrew Koyanagi, MD;  Location: [facility];  Service: Ophthalmology;  Laterality: Right;  [place]    . COLONOSCOPY WITH PROPOFOL N/A 07/15/2015   Procedure: COLONOSCOPY WITH PROPOFOL;  Surgeon: Josefine Class, MD;  Location: [facility] ENDOSCOPY;  Service: Endoscopy;  Laterality: N/A;  . Duodenojejunostomy    . HERNIA REPAIR    . prostate tissue removal  1996    Family History  Problem Relation Age of Onset  . Heart disease Mother   . Diabetes Mother   . Cancer Father   . Prostate cancer Brother     Social History   Socioeconomic History  . Marital status: Married    Spouse name: Not on file  . Number of  children: Not on file  . Years of education: Not on file  . Highest education level: Not on file  Occupational History  . Occupation: retired  Tobacco Use  . Smoking status: Former Smoker    Packs/day: 0.50    Years: 50.00    Pack years: 25.00    Quit date: 12/29/1999    Years since quitting: 20.5  . Smokeless tobacco: Never Used  Vaping Use  . Vaping Use: Never used  Substance and Sexual Activity  . Alcohol use: Never  . Drug use: Never  . Sexual activity: Not on file  Other Topics Concern  . Not on file  Social History Narrative  . Not on file   Social Determinants of Health   Financial Resource Strain:   . Difficulty of Paying Living Expenses: Not on file  Food Insecurity:   . Worried About Charity fundraiser in the Last Year: Not on file  . Ran Out of Food in the Last Year: Not on file  Transportation Needs:   . Lack of Transportation (Medical): Not on file  . Lack of Transportation (Non-Medical): Not on file  Physical Activity:   . Days of Exercise per Week: Not on file  . Minutes of Exercise per Session: Not on file  Stress:   . Feeling of Stress :  Not on file  Social Connections:   . Frequency of Communication with Friends and Family: Not on file  . Frequency of Social Gatherings with Friends and Family: Not on file  . Attends Religious Services: Not on file  . Active Member of Clubs or Organizations: Not on file  . Attends Archivist Meetings: Not on file  . Marital Status: Not on file  Intimate Partner Violence:   . Fear of Current or Ex-Partner: Not on file  . Emotionally Abused: Not on file  . Physically Abused: Not on file  . Sexually Abused: Not on file     Current Outpatient Medications:  .  b complex vitamins capsule, Take 1 capsule by mouth daily., Disp: , Rfl:  .  Calcium Carbonate-Vit D-Min (CALCIUM 1200 PO), Take 600 mg by mouth., Disp: , Rfl:  .  Cholecalciferol (D3-1000) 1000 units tablet, Take 1,000 Units by mouth daily., Disp: ,  Rfl:  .  co-enzyme Q-10 30 MG capsule, Take 30 mg by mouth daily., Disp: , Rfl:  .  doxazosin (CARDURA) 4 MG tablet, Take 1 tablet (4 mg total) by mouth daily., Disp: 30 tablet, Rfl: 6 .  ferrous sulfate 325 (65 FE) MG tablet, Take 65 mg by mouth daily with breakfast. , Disp: , Rfl:  .  loratadine (CLARITIN) 10 MG tablet, Take 1 tablet (10 mg total) by mouth daily., Disp: 30 tablet, Rfl: 11 .  lovastatin (MEVACOR) 20 MG tablet, Take 1 tablet (20 mg total) by mouth at bedtime., Disp: 90 tablet, Rfl: 3 .  metoprolol succinate (TOPROL-XL) 25 MG 24 hr tablet, Take 1 tablet (25 mg total) by mouth daily., Disp: 90 tablet, Rfl: 3 .  Multiple Vitamins-Minerals (MULTIVITAMIN WITH MINERALS) tablet, Take 1 tablet by mouth daily., Disp: , Rfl:  .  Omega-3 Fatty Acids (FISH OIL) 500 MG CAPS, Take 500 mg by mouth., Disp: , Rfl:  .  TURMERIC CURCUMIN PO, Take 550 mg by mouth., Disp: , Rfl:  .  vitamin C (ASCORBIC ACID) 500 MG tablet, Take 500 mg by mouth daily., Disp: , Rfl:    Allergies  Allergen Reactions  . Lisinopril Other (See Comments)    Face Swelling    ROS Review of Systems  Constitutional: Positive for fatigue.  HENT: Negative.   Eyes: Negative.   Respiratory: Negative.   Cardiovascular: Negative.   Gastrointestinal: Positive for nausea.  Endocrine: Negative.   Genitourinary: Negative.        Mild incontinence   Musculoskeletal: Negative.   Skin: Negative.   Allergic/Immunologic: Negative.   Neurological: Negative.   Hematological: Negative.   Psychiatric/Behavioral: Negative.   All other systems reviewed and are negative.    OBJECTIVE:    Physical Exam Vitals reviewed.  Constitutional:      Appearance: Normal appearance.  HENT:     Mouth/Throat:     Mouth: Mucous membranes are moist.  Eyes:     Pupils: Pupils are equal, round, and reactive to light.  Neck:     Vascular: No carotid bruit.  Cardiovascular:     Rate and Rhythm: Normal rate and regular rhythm.     Pulses:           Dorsalis pedis pulses are 1+ on the right side and 1+ on the left side.     Heart sounds: Normal heart sounds.  Pulmonary:     Effort: Pulmonary effort is normal.     Breath sounds: Normal breath sounds.  Abdominal:  General: Bowel sounds are normal.     Palpations: Abdomen is soft. There is no hepatomegaly, splenomegaly or mass.     Tenderness: There is no abdominal tenderness.     Hernia: No hernia is present.  Musculoskeletal:     Cervical back: Neck supple.     Right lower leg: 2+ Edema present.     Left lower leg: 2+ Edema present.  Skin:    Findings: No rash.  Neurological:     Mental Status: He is alert and oriented to person, place, and time.     Motor: No weakness.  Psychiatric:        Mood and Affect: Mood normal.        Behavior: Behavior normal.     BP 125/65   Pulse 71   Ht 5\' 9"  (1.753 m)   Wt 145 lb 9.6 oz (66 kg)   BMI 21.50 kg/m  Wt Readings from Last 3 Encounters:  06/30/20 145 lb 9.6 oz (66 kg)  05/16/20 141 lb 8 oz (64.2 kg)  05/12/20 140 lb 12.8 oz (63.9 kg)    Health Maintenance Due  Topic Date Due  . TETANUS/TDAP  Never done  . PNA vac Low Risk Adult (1 of 2 - PCV13) Never done  . INFLUENZA VACCINE  Never done    There are no preventive care reminders to display for this patient.  CBC Latest Ref Rng & Units 04/27/2020 03/09/2020 02/18/2020  WBC 4.0 - 10.5 K/uL 7.0 5.6 6.8  Hemoglobin 13.0 - 17.0 g/dL 12.7(L) 12.2(L) 12.6(L)  Hematocrit 39 - 52 % 35.7(L) 35.5(L) 36.5(L)  Platelets 150 - 400 K/uL 184 181 168   CMP Latest Ref Rng & Units 04/27/2020 02/18/2020 12/29/2019  Glucose 70 - 99 mg/dL 125(H) 129(H) 115(H)  BUN 8 - 23 mg/dL 25(H) 18 16  Creatinine 0.61 - 1.24 mg/dL 1.13 0.91 0.86  Sodium 135 - 145 mmol/L 137 132(L) 132(L)  Potassium 3.5 - 5.1 mmol/L 4.7 4.3 4.9  Chloride 98 - 111 mmol/L 101 98 97(L)  CO2 22 - 32 mmol/L 27 25 24   Calcium 8.9 - 10.3 mg/dL 9.0 8.8(L) 9.2  Total Protein 6.5 - 8.1 g/dL 7.1 6.9 7.4  Total  Bilirubin 0.3 - 1.2 mg/dL 1.4(H) 1.4(H) 2.3(H)  Alkaline Phos 38 - 126 U/L 69 69 66  AST 15 - 41 U/L 31 24 21   ALT 0 - 44 U/L 36 26 22    Lab Results  Component Value Date   TSH 1.429 02/18/2020   Lab Results  Component Value Date   ALBUMIN 4.1 04/27/2020   ANIONGAP 9 04/27/2020   No results found for: CHOL, HDL, LDLCALC, CHOLHDL No results found for: TRIG No results found for: HGBA1C    ASSESSMENT & PLAN:   Problem List Items Addressed This Visit      Cardiovascular and Mediastinum   Essential hypertension - Primary    - Today, the patient's blood pressure is well managed on metoprolol. - The patient will continue the current treatment regimen.  - I encouraged the patient to eat a low-sodium diet to help control blood pressure. - I encouraged the patient to live an active lifestyle and complete activities that increases heart rate to 85% target heart rate at least 5 times per week for one hour.            Other   Hyperlipidemia    Patient is taking Mevacor 40 mg daily      Annual physical exam  Patient is known to have prostate cancer.  He received radiation treatment and has also been taking [name] shots.  He complains of tiredness and fatigue.  He denies any chest pain shortness of breath.  He is regularly being followed up by the radiologist and the cancer doctor.  His physical examination is unremarkable chest is clear heart is regular abdomen is soft nontender and there is there is 2+ pedal edema.      Angio-edema    Patient lip swelling has gone down.        H/O prostate cancer    His PSA Is 0.07.  Patient is not having any symptoms right now.         No orders of the defined types were placed in this encounter.   Follow-up: No follow-ups on file.    Cletis Athens, MD [facility] [address], [place],  [address]   By signing my name below, I, [employer], attest that this documentation has been prepared under the  direction and in the presence of Dr. Cletis Athens Electronically Signed: Cletis Athens, MD 06/30/20, 12:19 PM  I personally performed the services described in this documentation, which was SCRIBED in my presence. The recorded information has been reviewed and considered accurate. It has been edited as necessary during review. Cletis Athens, MD

## 2020-06-30 NOTE — Assessment & Plan Note (Signed)
-   Today, the patient's blood pressure is well managed on metoprolol. - The patient will continue the current treatment regimen.  - I encouraged the patient to eat a low-sodium diet to help control blood pressure. - I encouraged the patient to live an active lifestyle and complete activities that increases heart rate to 85% target heart rate at least 5 times per week for one hour.

## 2020-06-30 NOTE — Assessment & Plan Note (Signed)
Patient is taking Mevacor 40 mg daily

## 2020-07-19 ENCOUNTER — Telehealth: Payer: Self-pay

## 2020-07-19 ENCOUNTER — Other Ambulatory Visit: Payer: Self-pay

## 2020-07-19 ENCOUNTER — Inpatient Hospital Stay (HOSPITAL_BASED_OUTPATIENT_CLINIC_OR_DEPARTMENT_OTHER): Payer: Medicare HMO | Admitting: Oncology

## 2020-07-19 ENCOUNTER — Encounter: Payer: Self-pay | Admitting: Oncology

## 2020-07-19 ENCOUNTER — Inpatient Hospital Stay: Payer: Medicare HMO

## 2020-07-19 ENCOUNTER — Inpatient Hospital Stay: Payer: Medicare HMO | Attending: Oncology

## 2020-07-19 VITALS — BP 114/69 | HR 64 | Temp 97.3°F | Resp 16 | Wt 145.8 lb

## 2020-07-19 DIAGNOSIS — C61 Malignant neoplasm of prostate: Secondary | ICD-10-CM

## 2020-07-19 DIAGNOSIS — D649 Anemia, unspecified: Secondary | ICD-10-CM | POA: Diagnosis not present

## 2020-07-19 DIAGNOSIS — D696 Thrombocytopenia, unspecified: Secondary | ICD-10-CM | POA: Diagnosis not present

## 2020-07-19 DIAGNOSIS — Z5111 Encounter for antineoplastic chemotherapy: Secondary | ICD-10-CM | POA: Insufficient documentation

## 2020-07-19 LAB — COMPREHENSIVE METABOLIC PANEL
ALT: 28 U/L (ref 0–44)
AST: 27 U/L (ref 15–41)
Albumin: 3.9 g/dL (ref 3.5–5.0)
Alkaline Phosphatase: 58 U/L (ref 38–126)
Anion gap: 9 (ref 5–15)
BUN: 15 mg/dL (ref 8–23)
CO2: 26 mmol/L (ref 22–32)
Calcium: 8.7 mg/dL — ABNORMAL LOW (ref 8.9–10.3)
Chloride: 103 mmol/L (ref 98–111)
Creatinine, Ser: 1 mg/dL (ref 0.61–1.24)
GFR calc Af Amer: >60 mL/min (ref >60)
GFR calc non Af Amer: >60 mL/min (ref >60)
Glucose, Bld: 156 mg/dL — ABNORMAL HIGH (ref 70–99)
Potassium: 4.1 mmol/L (ref 3.5–5.1)
Sodium: 138 mmol/L (ref 135–145)
Total Bilirubin: 1.2 mg/dL (ref 0.3–1.2)
Total Protein: 6.5 g/dL (ref 6.5–8.1)

## 2020-07-19 LAB — CBC WITH DIFFERENTIAL/PLATELET
Abs Immature Granulocytes: 0.01 10*3/uL (ref 0.00–0.07)
Basophils Absolute: 0 10*3/uL (ref 0.0–0.1)
Basophils Relative: 0 %
Eosinophils Absolute: 0.1 10*3/uL (ref 0.0–0.5)
Eosinophils Relative: 3 %
HCT: 34.6 % — ABNORMAL LOW (ref 39.0–52.0)
Hemoglobin: 12.2 g/dL — ABNORMAL LOW (ref 13.0–17.0)
Immature Granulocytes: 0 %
Lymphocytes Relative: 17 %
Lymphs Abs: 0.8 10*3/uL (ref 0.7–4.0)
MCH: 33.5 pg (ref 26.0–34.0)
MCHC: 35.3 g/dL (ref 30.0–36.0)
MCV: 95.1 fL (ref 80.0–100.0)
Monocytes Absolute: 0.4 10*3/uL (ref 0.1–1.0)
Monocytes Relative: 9 %
Neutro Abs: 3.5 10*3/uL (ref 1.7–7.7)
Neutrophils Relative %: 71 %
Platelets: 146 10*3/uL — ABNORMAL LOW (ref 150–400)
RBC: 3.64 MIL/uL — ABNORMAL LOW (ref 4.22–5.81)
RDW: 12.7 % (ref 11.5–15.5)
WBC: 4.9 10*3/uL (ref 4.0–10.5)
nRBC: 0 % (ref 0.0–0.2)

## 2020-07-19 LAB — IRON AND TIBC
Iron: 121 ug/dL (ref 45–182)
Saturation Ratios: 43 % — ABNORMAL HIGH (ref 17.9–39.5)
TIBC: 279 ug/dL (ref 250–450)
UIBC: 158 ug/dL

## 2020-07-19 LAB — FERRITIN: Ferritin: 386 ng/mL — ABNORMAL HIGH (ref 24–336)

## 2020-07-19 LAB — PSA: Prostatic Specific Antigen: <0.01 ng/mL (ref 0.00–4.00)

## 2020-07-19 MED ORDER — LEUPROLIDE ACETATE (4 MONTH) 30 MG ~~LOC~~ KIT
30.0000 mg | PACK | Freq: Once | SUBCUTANEOUS | Status: AC
  Administered 2020-07-19: 30 mg via SUBCUTANEOUS
  Filled 2020-07-19: qty 30

## 2020-07-19 NOTE — Telephone Encounter (Signed)
-----   Message from Earlie Server, MD sent at 07/19/2020  2:59 PM EDT ----- Please let the patient know that iron levels are high and I recommend patient to stop oral iron supplementation.  I will check his level with the next visit.

## 2020-07-19 NOTE — Telephone Encounter (Signed)
Patient notified

## 2020-07-19 NOTE — Progress Notes (Signed)
Hematology/Oncology follow up note [facility] [phone] [phone]   Patient Care Team: Cletis Athens, MD as PCP - General (Internal Medicine) Noreene Filbert, MD as Radiation Oncologist (Radiation Oncology)  REFERRING PROVIDER: Cletis Athens, MD  CHIEF COMPLAINTS/REASON FOR VISIT:  Follow-up for prostate cancer  HISTORY OF PRESENTING ILLNESS:   Mathew Martinez is a  86 y.o.  male with PMH listed below was seen in consultation at the request of  Cletis Athens, MD  for evaluation of prostate cancer Patient was seen by urologist Dr. Bernardo Heater in February for evaluation of elevated PSA.  PSA drawn in November 2020 was elevated at 11.4.  No previous PSA results available.  Per patient, PSA typically fluctuates between 4 and 7 in the past. Also reports remote prostate biopsy and prostate surgery many years ago.  Patient denies any lower urinary tract symptoms.  A repeat PSA was drawn at Dr. Clydene Laming office and that level increased to 15. 12/15/2019, patient underwent prostate biopsy Pathology report was scanned in epic. Left prostate biopsies showed benign pathology. Right base, right mid, right lateral base, right lateral mid, right lateral apex biopsies were positive for adenocarcinoma. 4 out of the 5 biopsies have Gleason score 7 (4+3), right apex has Gleason score 8 (4+4).  Patient was recommended by urologist to complete staging images.  Patient prefers to be referred to cancer center for counseling and management.  Today patient was accompanied by his wife.  He reports feeling well at baseline.  No urinary symptoms. Denies any fever, chills, unintentional weight loss, abdominal pain, bone pain. He is quite active at baseline.  He does push-up exercise.  -01/12/2020 CT chest abdomen pelvis  1.8 cm right lower lobe pulmonary nodule, metastatic lung disease versus primary lung cancer.  Otherwise no potential findings of metastatic disease in the  chest abdomen or pelvis.  Patient has three-vessel coronary office marked diffuse colonic diverticulosis.  Emphysema - PET Rectal normal lung nodule has very low FDG activity on PET scan, likely not metastatic prostate cancer. Low-grade primary lung cancer-ie, adenocarcinoma cannot be ruled out.  Patient has history of tobacco use   Patient has life expectancy> 5 years,I recommend radiation plus androgen deprivation therapy for 1-3 years.  Rationale and potential side effects of androgen deprivation therapy were discussed with patient.  #  2 doses of Firmagon with last dose of Firmagon on 02/18/2020. # switched to Eligard on 03/17/2020. INTERVAL HISTORY Mathew Martinez is a 86 y.o. male who has above history reviewed by me today presents for follow up visit for management of prostate cancer Problems and complaints are listed below: Patient denies any new complaints.  No additional episodes of lip swelling.  Lisinopril has been discontinued and switched to other medications.  For his 1.8 cm right lower lobe lung nodule, CT shows stable size.  Radonc recommend to continue observation rather than SBRT now.  Review of Systems  Constitutional: Negative for appetite change, chills, fatigue, fever and unexpected weight change.  HENT:   Negative for hearing loss and voice change.   Eyes: Negative for eye problems and icterus.  Respiratory: Negative for chest tightness, cough and shortness of breath.   Cardiovascular: Negative for chest pain and leg swelling.  Gastrointestinal: Negative for abdominal distention and abdominal pain.  Endocrine: Negative for hot flashes.  Genitourinary: Negative for difficulty urinating, dysuria and frequency.   Musculoskeletal: Negative for arthralgias.  Skin: Negative for itching and rash.  Neurological: Negative for light-headedness and numbness.  Hematological: Negative for adenopathy. Does not bruise/bleed easily.  Psychiatric/Behavioral: Negative for confusion  ( ).    MEDICAL HISTORY:  Past Medical History:  Diagnosis Date  . Arthritis    wrist  . BPH (benign prostatic hyperplasia)   . Hard of hearing   . Heart murmur   . History of basal cell carcinoma   . History of dysplastic nevus   . History of melanoma 01/14/2013   left upper back  . Hyperlipemia   . Hypertension   . Prostate cancer ([place]) 12/29/2019    SURGICAL HISTORY: Past Surgical History:  Procedure Laterality Date  . APPENDECTOMY    . CATARACT EXTRACTION W/PHACO Right 03/19/2018   Procedure: CATARACT EXTRACTION PHACO AND INTRAOCULAR LENS PLACEMENT ([place]) COMPLICATED RIGHT;  Surgeon: Leandrew Koyanagi, MD;  Location: [place];  Service: Ophthalmology;  Laterality: Right;  [place]    . COLONOSCOPY WITH PROPOFOL N/A 07/15/2015   Procedure: COLONOSCOPY WITH PROPOFOL;  Surgeon: Josefine Class, MD;  Location[facility] ENDOSCOPY;  Service: Endoscopy;  Laterality: N/A;  . Duodenojejunostomy    . HERNIA REPAIR    . prostate tissue removal  1996    SOCIAL HISTORY: Social History   Socioeconomic History  . Marital status: Married    Spouse name: Not on file  . Number of children: Not on file  . Years of education: Not on file  . Highest education level: Not on file  Occupational History  . Occupation: retired  Tobacco Use  . Smoking status: Former Smoker    Packs/day: 0.50    Years: 50.00    Pack years: 25.00    Quit date: 12/29/1999    Years since quitting: 20.5  . Smokeless tobacco: Never Used  Vaping Use  . Vaping Use: Never used  Substance and Sexual Activity  . Alcohol use: Never  . Drug use: Never  . Sexual activity: Not on file  Other Topics Concern  . Not on file  Social History Narrative  . Not on file   Social Determinants of Health   Financial Resource Strain:   . Difficulty of Paying Living Expenses: Not on file  Food Insecurity:   . Worried About Charity fundraiser in the Last Year: Not on file  . Ran Out of  Food in the Last Year: Not on file  Transportation Needs:   . Lack of Transportation (Medical): Not on file  . Lack of Transportation (Non-Medical): Not on file  Physical Activity:   . Days of Exercise per Week: Not on file  . Minutes of Exercise per Session: Not on file  Stress:   . Feeling of Stress : Not on file  Social Connections:   . Frequency of Communication with Friends and Family: Not on file  . Frequency of Social Gatherings with Friends and Family: Not on file  . Attends Religious Services: Not on file  . Active Member of Clubs or Organizations: Not on file  . Attends Archivist Meetings: Not on file  . Marital Status: Not on file  Intimate Partner Violence:   . Fear of Current or Ex-Partner: Not on file  . Emotionally Abused: Not on file  . Physically Abused: Not on file  . Sexually Abused: Not on file    FAMILY HISTORY: Family History  Problem Relation Age of Onset  . Heart disease Mother   . Diabetes Mother   . Cancer Father   . Prostate cancer Brother  ALLERGIES:  is allergic to lisinopril.  MEDICATIONS:  Current Outpatient Medications  Medication Sig Dispense Refill  . b complex vitamins capsule Take 1 capsule by mouth daily.    . Calcium Carbonate-Vit D-Min (CALCIUM 1200 PO) Take 600 mg by mouth.    . Cholecalciferol (D3-1000) 1000 units tablet Take 1,000 Units by mouth daily.    [name] co-enzyme Q-10 30 MG capsule Take 30 mg by mouth daily.    [name] doxazosin (CARDURA) 4 MG tablet Take 1 tablet (4 mg total) by mouth daily. 30 tablet 6  . ferrous sulfate 325 (65 FE) MG tablet Take 65 mg by mouth daily with breakfast.     . loratadine (CLARITIN) 10 MG tablet Take 1 tablet (10 mg total) by mouth daily. 30 tablet 11  . lovastatin (MEVACOR) 20 MG tablet Take 1 tablet (20 mg total) by mouth at bedtime. 90 tablet 3  . metoprolol succinate (TOPROL-XL) 25 MG 24 hr tablet Take 1 tablet (25 mg total) by mouth daily. 90 tablet 3  . Multiple Vitamins-Minerals  (MULTIVITAMIN WITH MINERALS) tablet Take 1 tablet by mouth daily.    . Omega-3 Fatty Acids (FISH OIL) 500 MG CAPS Take 500 mg by mouth.    . TURMERIC CURCUMIN PO Take 550 mg by mouth.    . vitamin C (ASCORBIC ACID) 500 MG tablet Take 500 mg by mouth daily.     No current facility-administered medications for this visit.     PHYSICAL EXAMINATION: ECOG PERFORMANCE STATUS: 0 - Asymptomatic Vitals:   07/19/20 0954  BP: 114/69  Pulse: 64  Resp: 16  Temp: (!) 97.3 F (36.3 C)   Filed Weights   07/19/20 0954  Weight: 145 lb 12.8 oz (66.1 kg)    Physical Exam Constitutional:      General: He is not in acute distress. HENT:     Head: Normocephalic and atraumatic.     Mouth/Throat:     Comments: Mild lip swelling, no tongue swelling Eyes:     General: No scleral icterus. Cardiovascular:     Rate and Rhythm: Normal rate and regular rhythm.     Heart sounds: Normal heart sounds.  Pulmonary:     Effort: Pulmonary effort is normal. No respiratory distress.     Breath sounds: No wheezing.  Abdominal:     General: Bowel sounds are normal. There is no distension.     Palpations: Abdomen is soft.  Musculoskeletal:        General: No deformity. Normal range of motion.     Cervical back: Normal range of motion and neck supple.  Skin:    General: Skin is warm and dry.     Findings: No erythema or rash.  Neurological:     Mental Status: He is alert and oriented to person, place, and time. Mental status is at baseline.     Cranial Nerves: No cranial nerve deficit.     Coordination: Coordination normal.  Psychiatric:        Mood and Affect: Mood normal.       LABORATORY DATA:  I have reviewed the data as listed Lab Results  Component Value Date   WBC 4.9 07/19/2020   HGB 12.2 (L) 07/19/2020   HCT 34.6 (L) 07/19/2020   MCV 95.1 07/19/2020   PLT 146 (L) 07/19/2020   Recent Labs    12/29/19 1049 12/29/19 1050 02/18/20 0936 04/27/20 1004 07/19/20 0936  NA   < >  --   132* 137 138  K   < >  --  4.3 4.7 4.1  CL   < >  --  98 101 103  CO2   < >  --  25 27 26   GLUCOSE   < >  --  129* 125* 156*  BUN   < >  --  18 25* 15  CREATININE   < >  --  0.91 1.13 1.00  CALCIUM   < >  --  8.8* 9.0 8.7*  GFRNONAA   < >  --  >60 58* >60  GFRAA   < >  --  >60 >60 >60  PROT   < >  --  6.9 7.1 6.5  ALBUMIN   < >  --  4.0 4.1 3.9  AST   < >  --  24 31 27   ALT   < >  --  26 36 28  ALKPHOS   < >  --  69 69 58  BILITOT   < >  --  1.4* 1.4* 1.2  BILIDIR  --  0.3*  --   --   --    < > = values in this interval not displayed.   Iron/TIBC/Ferritin/ %Sat    Component Value Date/Time   IRON 121 07/19/2020 0936   TIBC 279 07/19/2020 0936   FERRITIN 386 (H) 07/19/2020 0936   IRONPCTSAT 43 (H) 07/19/2020 0936      RADIOGRAPHIC STUDIES: I have personally reviewed the radiological images as listed and agreed with the findings in the report. No results found.    ASSESSMENT & PLAN:  1. Prostate cancer ([place])   2. Normocytic anemia   3. Thrombocytopenia ([place])   T2bN0M0 prostate cancer was discussed. #Risk stratification Grade group 4/Gleason 8 (4+4), PSA 15,  high risk group.   Finished radiation iron recommend continue androgen deprivation therapy for 1 to 3 years..   Continue Eligard and the next dose is due in September 2021  #Lung nodule, cannot rule out underlying malignancy.   05/04/2020, CT chest with contrast showed interval stability of the 1.8 cm pulmonary nodule.  Continue surveillance.  Repeat CT scan in 12 to 24 months.  #Chronic anemia, normocytic.  Continue to monitor.  Probably due to previous radiation. #Very mild thrombocytopenia, monitor.  I will check vitamin B12 and folate level at the next visit.  Orders Placed This Encounter  Procedures  . Iron and TIBC    Standing Status:   Future    Number of Occurrences:   1    Standing Expiration Date:   07/19/2021  . Ferritin    Standing Status:   Future    Number of Occurrences:   1    Standing  Expiration Date:   07/19/2021  . CBC with Differential/Platelet    Standing Status:   Future    Standing Expiration Date:   07/19/2021  . Comprehensive metabolic panel    Standing Status:   Future    Standing Expiration Date:   07/19/2021  . PSA    Standing Status:   Future    Standing Expiration Date:   07/19/2021    All questions were answered. The patient knows to call the clinic with any problems questions or concerns.   Return of visit: September 2021 Earlie Server, MD, PhD Hematology Oncology [facility] Pager- [phone] 07/19/2020

## 2020-07-19 NOTE — Progress Notes (Signed)
Patient reports feeling fatigued.  Also having "bladder leakage".

## 2020-08-17 ENCOUNTER — Ambulatory Visit: Payer: Medicare HMO | Admitting: Dermatology

## 2020-08-17 ENCOUNTER — Other Ambulatory Visit: Payer: Self-pay

## 2020-08-17 DIAGNOSIS — L814 Other melanin hyperpigmentation: Secondary | ICD-10-CM | POA: Diagnosis not present

## 2020-08-17 DIAGNOSIS — M25471 Effusion, right ankle: Secondary | ICD-10-CM | POA: Diagnosis not present

## 2020-08-17 DIAGNOSIS — Z1283 Encounter for screening for malignant neoplasm of skin: Secondary | ICD-10-CM

## 2020-08-17 DIAGNOSIS — B353 Tinea pedis: Secondary | ICD-10-CM

## 2020-08-17 DIAGNOSIS — M25472 Effusion, left ankle: Secondary | ICD-10-CM

## 2020-08-17 DIAGNOSIS — D1800 Hemangioma unspecified site: Secondary | ICD-10-CM

## 2020-08-17 DIAGNOSIS — Z85828 Personal history of other malignant neoplasm of skin: Secondary | ICD-10-CM | POA: Diagnosis not present

## 2020-08-17 DIAGNOSIS — L578 Other skin changes due to chronic exposure to nonionizing radiation: Secondary | ICD-10-CM

## 2020-08-17 DIAGNOSIS — L821 Other seborrheic keratosis: Secondary | ICD-10-CM

## 2020-08-17 DIAGNOSIS — C61 Malignant neoplasm of prostate: Secondary | ICD-10-CM | POA: Diagnosis not present

## 2020-08-17 DIAGNOSIS — L720 Epidermal cyst: Secondary | ICD-10-CM

## 2020-08-17 DIAGNOSIS — D229 Melanocytic nevi, unspecified: Secondary | ICD-10-CM

## 2020-08-17 DIAGNOSIS — Z86018 Personal history of other benign neoplasm: Secondary | ICD-10-CM

## 2020-08-17 MED ORDER — KETOCONAZOLE 2 % EX CREA: TOPICAL_CREAM | CUTANEOUS | 3 refills | Status: DC

## 2020-08-17 NOTE — Progress Notes (Signed)
   Follow-Up Visit   Subjective  Mathew Martinez is a 86 y.o. male who presents for the following: Annual Exam (Hx BCC, dysplastic nevi ). The patient presents for Total-Body Skin Exam (TBSE) for skin cancer screening and mole check.  The following portions of the chart were reviewed this encounter and updated as appropriate:  Tobacco  Allergies  Meds  Problems  Med Hx  Surg Hx  Fam Hx     Review of Systems:  No other skin or systemic complaints except as noted in HPI or Assessment and Plan.  Objective  Well appearing patient in no apparent distress; mood and affect are within normal limits.  A full examination was performed including scalp, head, eyes, ears, nose, lips, neck, chest, axillae, abdomen, back, buttocks, bilateral upper extremities, bilateral lower extremities, hands, feet, fingers, toes, fingernails, and toenails. All findings within normal limits unless otherwise noted below.  Objective  R cheek: Firm SQ nodule 1.2 x 1.0 cm   Objective  B/L leg: Scale   Objective  B/L ankle: Edema   Assessment & Plan  Epidermal cyst R cheek Consider excision in the future  He wants to wait until after his prostate cancer treatment is complete.  Prostate cancer [facility]) Prostate Recent diagnosis currently on treatment with radiation Continue appointments with oncology  Tinea pedis of both feet B/L leg Start Ketoconazole 2% cream QHS  ketoconazole (NIZORAL) 2 % cream - B/L leg  Ankle edema, bilateral B/L ankle Likely secondary to prostate cancer treatment  benign appearing - likely from CA tx   Lentigines - Scattered tan macules - Discussed due to sun exposure - Benign, observe - Call for any changes  Seborrheic Keratoses - Stuck-on, waxy, tan-brown papules and plaques  - Discussed benign etiology and prognosis. - Observe - Call for any changes  Melanocytic Nevi - Tan-brown and/or pink-flesh-colored symmetric macules and papules - Benign appearing on  exam today - Observation - Call clinic for new or changing moles - Recommend daily use of broad spectrum spf 30+ sunscreen to sun-exposed areas.   Hemangiomas - Red papules - Discussed benign nature - Observe - Call for any changes  Actinic Damage - diffuse scaly erythematous macules with underlying dyspigmentation - Recommend daily broad spectrum sunscreen SPF 30+ to sun-exposed areas, reapply every 2 hours as needed.  - Call for new or changing lesions.  History of Basal Cell Carcinoma of the Skin - No evidence of recurrence today - Recommend regular full body skin exams - Recommend daily broad spectrum sunscreen SPF 30+ to sun-exposed areas, reapply every 2 hours as needed.  - Call if any new or changing lesions are noted between office visits  History of Dysplastic Nevi - No evidence of recurrence today - Recommend regular full body skin exams - Recommend daily broad spectrum sunscreen SPF 30+ to sun-exposed areas, reapply every 2 hours as needed.  - Call if any new or changing lesions are noted between office visits  Skin cancer screening performed today.  Return in about 1 year (around 08/17/2021) for TBSE.  Luther Redo, CMA, am acting as scribe for Sarina Ser, MD .  Documentation: I have reviewed the above documentation for accuracy and completeness, and I agree with the above.  Sarina Ser, MD

## 2020-08-18 ENCOUNTER — Encounter: Payer: Self-pay | Admitting: Dermatology

## 2020-09-22 IMAGING — CT CT CHEST W/ CM
2 of 5 series · 12 of 36 positions shown, 15 images · IV contrast (omnipaque)
Comparison: None.

CLINICAL DATA: Primary Cancer Type: Prostate

Imaging Indication: Initial staging
Initial Cancer Diagnosis
Date: 12/15/2019
Established by: Biopsy-proven
Detailed Pathology: Gleason 7
EXAM:
CT CHEST, ABDOMEN, AND PELVIS WITH CONTRAST
TECHNIQUE: Multidetector CT imaging of the chest, abdomen and pelvis was
performed following the standard protocol during bolus
administration of intravenous contrast.
CONTRAST:  100mL OMNIPAQUE IOHEXOL 300 MG/ML  SOLN

[Series 2: cap with · axial · 0.71mm/px · z∈[-821,-291]mm · 9 of 130 slices shown, 12 images]
[im 12/130  mediastinal]
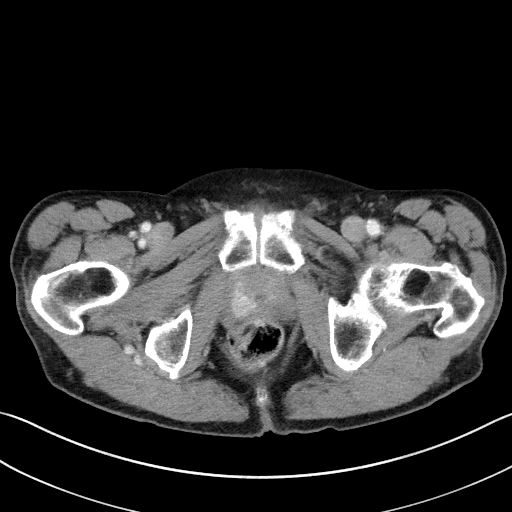
[im 12/130  lung]
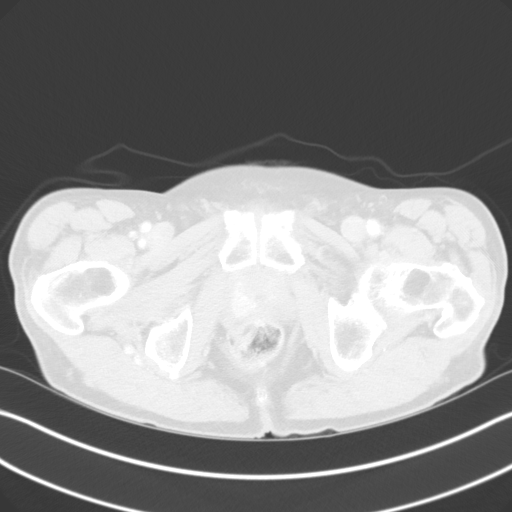
[im 24/130  lung]
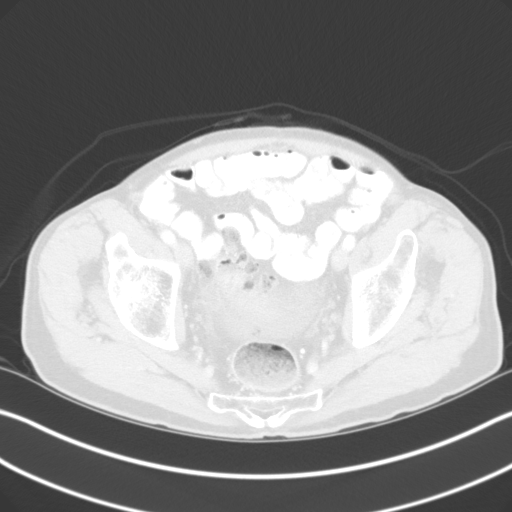
[im 36/130  lung]
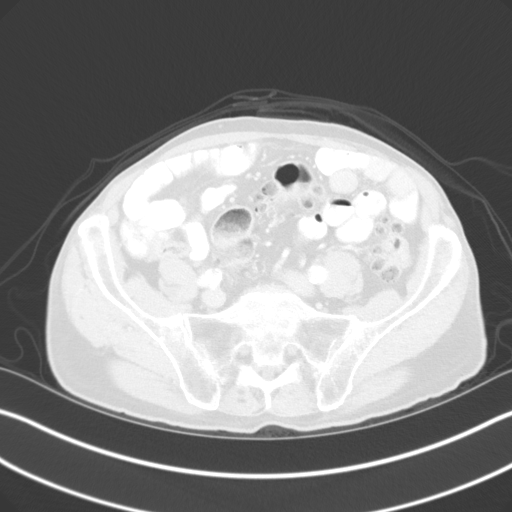
[im 47/130  lung]
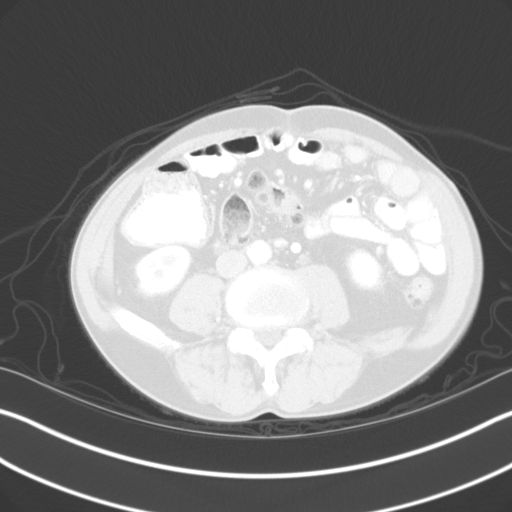
[im 71/130  mediastinal]
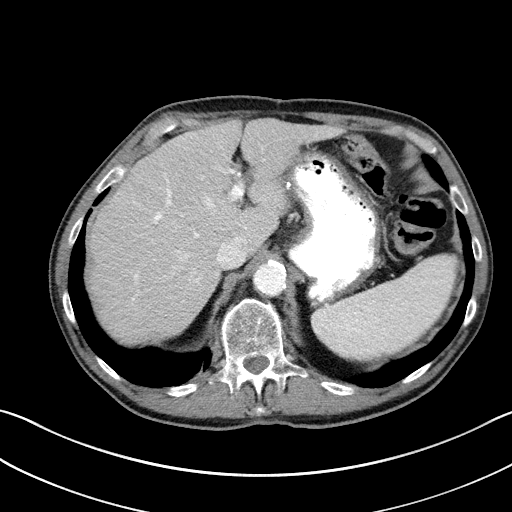
[im 71/130  lung]
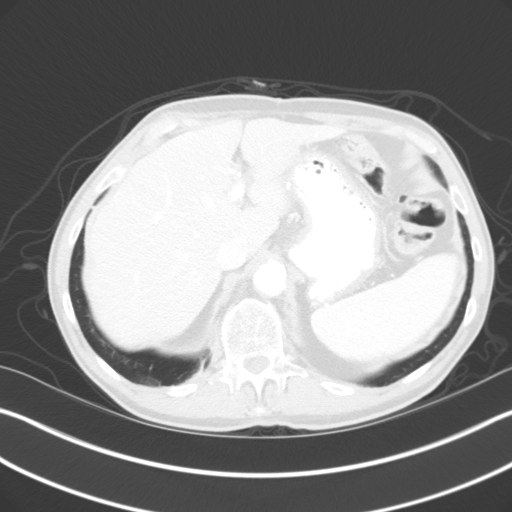
[im 83/130  lung]
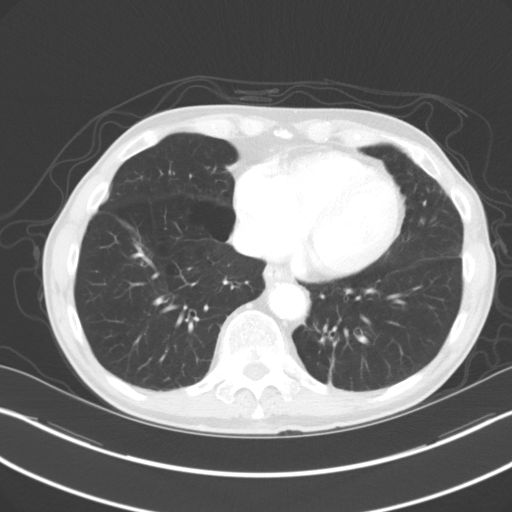
[im 94/130  lung]
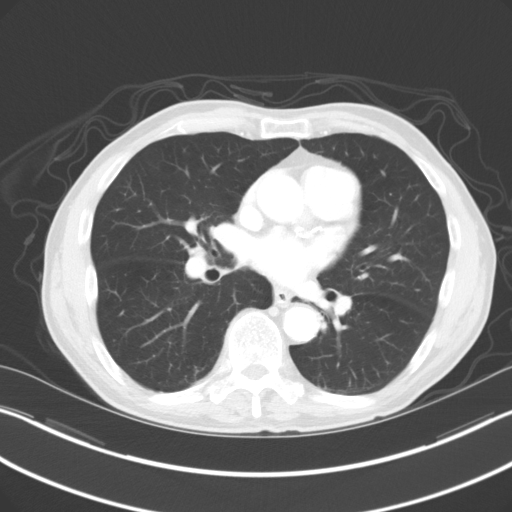
[im 106/130  lung]
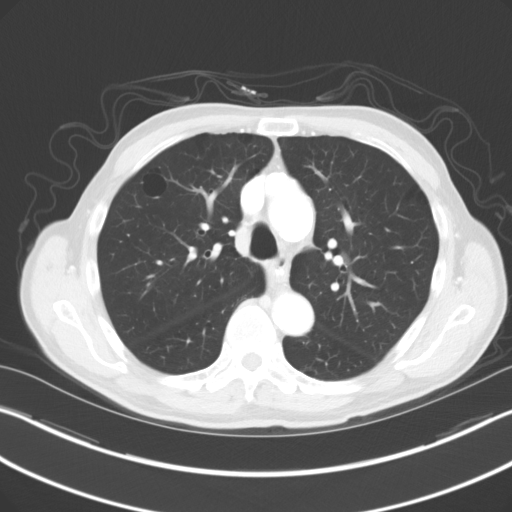
[im 118/130  mediastinal]
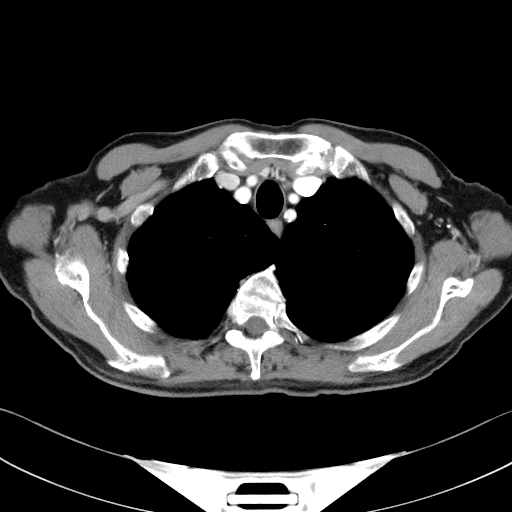
[im 118/130  lung]
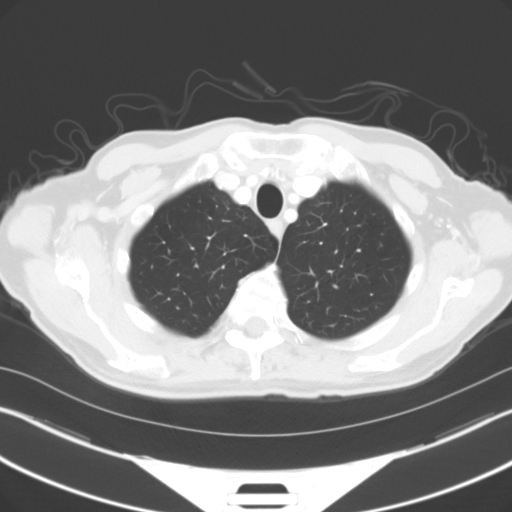

[Series 5: coronals · coronal · 0.74mm/px · 3 of 124 slices shown]
[im 25/124  lung]
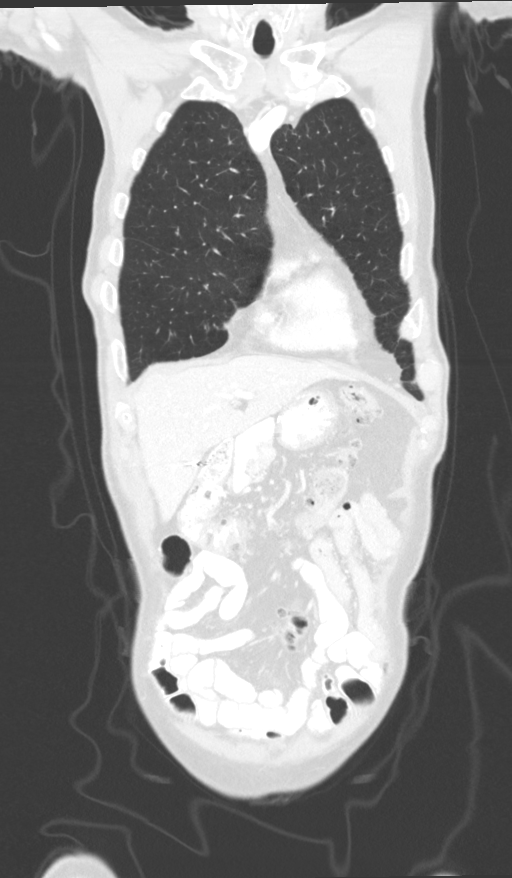
[im 50/124  lung]
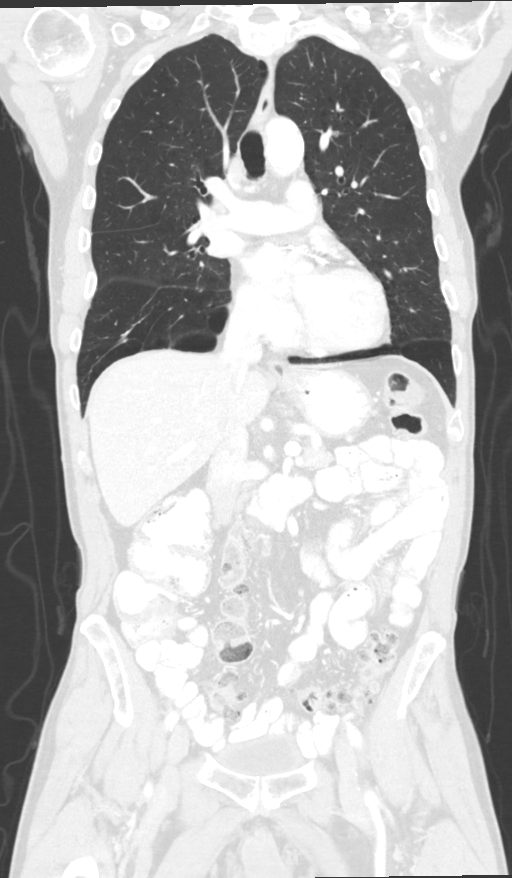
[im 74/124  lung]
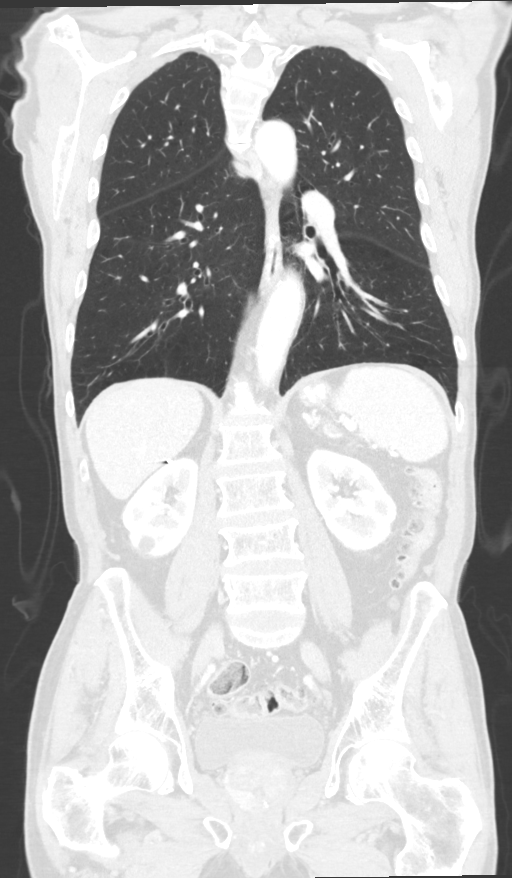

[12 of 36 positions shown; findings below may reference images not displayed]

FINDINGS: CT CHEST FINDINGS

Cardiovascular: Normal heart size. No significant pericardial
effusion/thickening. Three-vessel coronary atherosclerosis.
Atherosclerotic nonaneurysmal thoracic aorta. Normal caliber main
pulmonary artery. No central pulmonary emboli.

Mediastinum/Nodes: No discrete thyroid nodules. Unremarkable
esophagus. No pathologically enlarged axillary, mediastinal or hilar
lymph nodes.

Lungs/Pleura: No pneumothorax. No pleural effusion. Mild
centrilobular and paraseptal emphysema. No acute consolidative
airspace disease or lung masses. Solid anterior right lower lobe
cm pulmonary nodule (series 4/image 106). No additional significant
pulmonary nodules. Scattered mild cylindrical bronchiectasis in the
lingula and basilar lower lobes bilaterally with scattered mucoid
impaction in the lingula.

Musculoskeletal: No aggressive appearing focal osseous lesions. Mild
thoracic spondylosis.

CT ABDOMEN PELVIS FINDINGS

Hepatobiliary: Normal liver size. Subcentimeter hypodense inferior
right liver lesion (series 2/image 67), too small to characterize.
No additional liver lesions. Cholecystectomy. No biliary ductal
dilatation.

Pancreas: Normal, with no mass or duct dilation.

Spleen: Normal size. No mass.

Adrenals/Urinary Tract: Normal adrenals. No hydronephrosis. Simple
1.2 cm posterior lower right renal cyst. Subcentimeter hypodense
renal cortical lesion in the upper right kidney is too small to
characterize and requires no follow-up. Normal bladder.

Stomach/Bowel: Normal non-distended stomach. Normal caliber small
bowel with no small bowel wall thickening. Appendectomy. Marked
diffuse colonic diverticulosis, most prominent in the sigmoid colon,
with no large bowel wall thickening or significant pericolonic fat
stranding.

Vascular/Lymphatic: Atherosclerotic nonaneurysmal abdominal aorta.
Patent portal, splenic, hepatic and renal veins. No pathologically
enlarged lymph nodes in the abdomen or pelvis.

Reproductive: Mildly enlarged prostate.

Other: No pneumoperitoneum, ascites or focal fluid collection.

Musculoskeletal: No aggressive appearing focal osseous lesions.
IMPRESSION: 1. Solid 1.8 cm right lower lobe pulmonary nodule, malignancy not
excluded. Differential includes primary bronchogenic carcinoma
(particularly given the smoking related changes in the lungs) versus
isolated pulmonary metastasis. Consider further evaluation with
PET-CT.
2. Otherwise no potential findings of metastatic disease in the
chest, abdomen or pelvis.
3. Chronic findings include: Three-vessel coronary atherosclerosis.
Marked diffuse colonic diverticulosis. Mildly enlarged prostate.
Aortic Atherosclerosis (09GL4-WA9.9) and Emphysema (09GL4-2GV.Z).

## 2020-10-04 ENCOUNTER — Other Ambulatory Visit: Payer: Self-pay

## 2020-10-04 ENCOUNTER — Ambulatory Visit (INDEPENDENT_AMBULATORY_CARE_PROVIDER_SITE_OTHER): Payer: Medicare HMO | Admitting: Internal Medicine

## 2020-10-04 ENCOUNTER — Encounter: Payer: Self-pay | Admitting: Internal Medicine

## 2020-10-04 VITALS — BP 110/60 | HR 86 | Ht 69.0 in | Wt 145.9 lb

## 2020-10-04 DIAGNOSIS — C61 Malignant neoplasm of prostate: Secondary | ICD-10-CM

## 2020-10-04 DIAGNOSIS — J301 Allergic rhinitis due to pollen: Secondary | ICD-10-CM

## 2020-10-04 DIAGNOSIS — E7800 Pure hypercholesterolemia, unspecified: Secondary | ICD-10-CM | POA: Diagnosis not present

## 2020-10-04 DIAGNOSIS — S43421A Sprain of right rotator cuff capsule, initial encounter: Secondary | ICD-10-CM | POA: Diagnosis not present

## 2020-10-04 DIAGNOSIS — I10 Essential (primary) hypertension: Secondary | ICD-10-CM

## 2020-10-04 NOTE — Progress Notes (Signed)
Established Patient Office Visit  Subjective:  Patient ID: Mathew Martinez, male    DOB: 12/30/1930  Age: 86 y.o. MRN: [id number]  CC:  Chief Complaint  Patient presents with  . Right shoulder pain    Patient is here for right shoulder pain x 1 month    HPI  Mathew Martinez presents for rt shoulder  pain  Past Medical History:  Diagnosis Date  . Arthritis    wrist  . Basal cell carcinoma 02/25/2013   Left mastoid area. Excised, margins free.  . Basal cell carcinoma 03/04/2013   Left posterior shoulder. Excised, margins free.  [name] BPH (benign prostatic hyperplasia)   . Dysplastic nevus 09/14/2013   Right upper back post. shoulder. Moderate to severe atypia, close to margin.  [name] Dysplastic nevus 09/14/2013   Right sup. popliteal. Moderate atypia, lateral and deep margins involved.  [name] Dysplastic nevus 09/14/2013   Right proximal med. calf. Moderate atypia, deep margin involved.   [name] Dysplastic nevus 09/14/2013   Right distal med. calf. Moderate atypia, lateral and deep margins involved.   . Hard of hearing   . Heart murmur   . History of basal cell carcinoma 02/18/2013   Right posterior wrist. Excised, margins free.  [name] History of melanoma 01/14/2013   left upper back  . Hyperlipemia   . Hypertension   . Prostate cancer ([place]) 12/29/2019    Past Surgical History:  Procedure Laterality Date  . APPENDECTOMY    . CATARACT EXTRACTION W/PHACO Right 03/19/2018   Procedure: CATARACT EXTRACTION PHACO AND INTRAOCULAR LENS PLACEMENT ([place]) COMPLICATED RIGHT;  Surgeon: Leandrew Koyanagi, MD;  Location: [place];  Service: Ophthalmology;  Laterality: Right;  [place]    . COLONOSCOPY WITH PROPOFOL N/A 07/15/2015   Procedure: COLONOSCOPY WITH PROPOFOL;  Surgeon: Josefine Class, MD;  Location: [facility] ENDOSCOPY;  Service: Endoscopy;  Laterality: N/A;  . Duodenojejunostomy    . HERNIA REPAIR    . prostate tissue removal  1996    Family History   Problem Relation Age of Onset  . Heart disease Mother   . Diabetes Mother   . Cancer Father   . Prostate cancer Brother     Social History   Socioeconomic History  . Marital status: Married    Spouse name: Not on file  . Number of children: Not on file  . Years of education: Not on file  . Highest education level: Not on file  Occupational History  . Occupation: retired  Tobacco Use  . Smoking status: Former Smoker    Packs/day: 0.50    Years: 50.00    Pack years: 25.00    Quit date: 12/29/1999    Years since quitting: 20.7  . Smokeless tobacco: Never Used  Vaping Use  . Vaping Use: Never used  Substance and Sexual Activity  . Alcohol use: Never  . Drug use: Never  . Sexual activity: Not on file  Other Topics Concern  . Not on file  Social History Narrative  . Not on file   Social Determinants of Health   Financial Resource Strain: Not on file  Food Insecurity: Not on file  Transportation Needs: Not on file  Physical Activity: Not on file  Stress: Not on file  Social Connections: Not on file  Intimate Partner Violence: Not on file     Current Outpatient Medications:  .  b complex vitamins capsule, Take 1 capsule by mouth daily., Disp: , Rfl:  .  Calcium Carbonate-Vit D-Min (CALCIUM 1200 PO), Take 600 mg by mouth., Disp: , Rfl:  .  Cholecalciferol (D3-1000) 1000 units tablet, Take 1,000 Units by mouth daily., Disp: , Rfl:  .  co-enzyme Q-10 30 MG capsule, Take 30 mg by mouth daily., Disp: , Rfl:  .  doxazosin (CARDURA) 4 MG tablet, Take 1 tablet (4 mg total) by mouth daily., Disp: 30 tablet, Rfl: 6 .  ferrous sulfate 325 (65 FE) MG tablet, Take 65 mg by mouth daily with breakfast. , Disp: , Rfl:  .  ketoconazole (NIZORAL) 2 % cream, Apply to the feet QHS, Disp: 60 g, Rfl: 3 .  loratadine (CLARITIN) 10 MG tablet, Take 1 tablet (10 mg total) by mouth daily., Disp: 30 tablet, Rfl: 11 .  lovastatin (MEVACOR) 20 MG tablet, Take 1 tablet (20 mg total) by mouth at  bedtime., Disp: 90 tablet, Rfl: 3 .  metoprolol succinate (TOPROL-XL) 25 MG 24 hr tablet, Take 1 tablet (25 mg total) by mouth daily., Disp: 90 tablet, Rfl: 3 .  Multiple Vitamins-Minerals (MULTIVITAMIN WITH MINERALS) tablet, Take 1 tablet by mouth daily., Disp: , Rfl:  .  Omega-3 Fatty Acids (FISH OIL) 500 MG CAPS, Take 500 mg by mouth., Disp: , Rfl:  .  TURMERIC CURCUMIN PO, Take 550 mg by mouth., Disp: , Rfl:  .  vitamin C (ASCORBIC ACID) 500 MG tablet, Take 500 mg by mouth daily., Disp: , Rfl:    Allergies  Allergen Reactions  . Lisinopril Other (See Comments)    Face Swelling    ROS Review of Systems  Constitutional: Negative.   HENT: Negative.   Eyes: Negative.   Respiratory: Negative.   Cardiovascular: Negative.   Gastrointestinal: Negative.   Endocrine: Negative.   Genitourinary: Negative.   Musculoskeletal: Negative.   Skin: Negative.   Allergic/Immunologic: Negative.   Neurological: Negative.   Hematological: Negative.   Psychiatric/Behavioral: Negative.   All other systems reviewed and are negative.     Objective:    Physical Exam Vitals reviewed.  Constitutional:      Appearance: Normal appearance.  HENT:     Mouth/Throat:     Mouth: Mucous membranes are moist.  Eyes:     Pupils: Pupils are equal, round, and reactive to light.  Neck:     Vascular: No carotid bruit.  Cardiovascular:     Rate and Rhythm: Normal rate and regular rhythm.     Pulses: Normal pulses.     Heart sounds: Normal heart sounds.  Pulmonary:     Effort: Pulmonary effort is normal.     Breath sounds: Normal breath sounds.  Abdominal:     General: Bowel sounds are normal.     Palpations: Abdomen is soft. There is no hepatomegaly, splenomegaly or mass.     Tenderness: There is no abdominal tenderness.     Hernia: No hernia is present.  Musculoskeletal:     Cervical back: Neck supple.     Right lower leg: No edema.     Left lower leg: No edema.  Skin:    Findings: No rash.   Neurological:     Mental Status: He is alert and oriented to person, place, and time.     Motor: No weakness.  Psychiatric:        Mood and Affect: Mood normal.        Behavior: Behavior normal.     BP 110/60   Pulse 86   Ht 5\' 9"  (1.753 m)   Wt 145  lb 14.4 oz (66.2 kg)   BMI 21.55 kg/m  Wt Readings from Last 3 Encounters:  10/04/20 145 lb 14.4 oz (66.2 kg)  07/19/20 145 lb 12.8 oz (66.1 kg)  06/30/20 145 lb 9.6 oz (66 kg)     Health Maintenance Due  Topic Date Due  . TETANUS/TDAP  Never done  . PNA vac Low Risk Adult (1 of 2 - PCV13) Never done  . COVID-19 Vaccine (3 - Pfizer risk 4-dose series) 12/19/2019    There are no preventive care reminders to display for this patient.  Lab Results  Component Value Date   TSH 1.429 02/18/2020   Lab Results  Component Value Date   WBC 4.9 07/19/2020   HGB 12.2 (L) 07/19/2020   HCT 34.6 (L) 07/19/2020   MCV 95.1 07/19/2020   PLT 146 (L) 07/19/2020   Lab Results  Component Value Date   NA 138 07/19/2020   K 4.1 07/19/2020   CO2 26 07/19/2020   GLUCOSE 156 (H) 07/19/2020   BUN 15 07/19/2020   CREATININE 1.00 07/19/2020   BILITOT 1.2 07/19/2020   ALKPHOS 58 07/19/2020   AST 27 07/19/2020   ALT 28 07/19/2020   PROT 6.5 07/19/2020   ALBUMIN 3.9 07/19/2020   CALCIUM 8.7 (L) 07/19/2020   ANIONGAP 9 07/19/2020   No results found for: CHOL No results found for: HDL No results found for: LDLCALC No results found for: TRIG No results found for: CHOLHDL No results found for: HGBA1C    Assessment & Plan:   Problem List Items Addressed This Visit      Cardiovascular and Mediastinum   Essential hypertension - Primary     .  - I encouraged the patient to eat a low-sodium diet to help control blood pressure. - I encouraged the patient to live an active lifestyle and complete activities that increases heart rate to 85% target heart rate at least 5 times per week for one hour.            Respiratory   Seasonal  allergic rhinitis due to pollen    Advised to take Claritin 5 mg p.o. daily        Musculoskeletal and Integument   Sprain of right rotator cuff capsule    Patient was injected with steroid injection on the right shoulder under local anesthesia. He tolerated the procedure well.      Relevant Orders   Joint Injection/Arthrocentesis     Genitourinary   Prostate cancer [facility])    Patient is under care of oncologist is doing well        Other   Hyperlipidemia    Patient was advised to follow low-cholesterol diet.         No orders of the defined types were placed in this encounter.   Follow-up: No follow-ups on file.  Joint Injection/Arthrocentesis  Date/Time: 10/04/2020 11:16 AM Performed by: Cletis Athens, MD Authorized by: Cletis Athens, MD  Body area: shoulder Joint: right subacromial bursa Local anesthesia used: yes  Anesthesia: Local anesthesia used: yes Local Anesthetic: lidocaine 1% without epinephrine  Sedation: Patient sedated: no  Needle size: 22 G Ultrasound guidance: no Approach: posterior Aspirate: clear Triamcinolone amount: 40 mg Lidocaine 1% amount: 1 mL Comments: Right shoulder was prepared with alcohol.  Next 1% lidocaine was injected under sub acromioclavicular joint.  Next 40 mg of triamcinolone was injected in the subacromial bursa in the right side.  Patient tolerated the procedure well and was asked to stay  in the waiting room for 5 minutes     Cletis Athens, MD

## 2020-10-04 NOTE — Assessment & Plan Note (Signed)
Patient was advised to follow low-cholesterol diet 

## 2020-10-04 NOTE — Assessment & Plan Note (Signed)
Advised to take Claritin 5 mg p.o. daily

## 2020-10-04 NOTE — Assessment & Plan Note (Signed)
Patient is under care of oncologist is doing well

## 2020-10-04 NOTE — Assessment & Plan Note (Signed)
Patient was injected with steroid injection on the right shoulder under local anesthesia. He tolerated the procedure well.

## 2020-10-04 NOTE — Assessment & Plan Note (Signed)
.  -   I encouraged the patient to eat a low-sodium diet to help control blood pressure. - I encouraged the patient to live an active lifestyle and complete activities that increases heart rate to 85% target heart rate at least 5 times per week for one hour.     

## 2020-10-17 ENCOUNTER — Inpatient Hospital Stay (HOSPITAL_BASED_OUTPATIENT_CLINIC_OR_DEPARTMENT_OTHER): Payer: Medicare HMO | Admitting: Oncology

## 2020-10-17 ENCOUNTER — Inpatient Hospital Stay: Payer: Medicare HMO | Attending: Oncology

## 2020-10-17 ENCOUNTER — Inpatient Hospital Stay: Payer: Medicare HMO

## 2020-10-17 ENCOUNTER — Encounter: Payer: Self-pay | Admitting: Oncology

## 2020-10-17 VITALS — BP 111/70 | HR 66 | Temp 97.3°F | Resp 16 | Wt 141.7 lb

## 2020-10-17 DIAGNOSIS — D696 Thrombocytopenia, unspecified: Secondary | ICD-10-CM | POA: Diagnosis not present

## 2020-10-17 DIAGNOSIS — Z79818 Long term (current) use of other agents affecting estrogen receptors and estrogen levels: Secondary | ICD-10-CM

## 2020-10-17 DIAGNOSIS — C61 Malignant neoplasm of prostate: Secondary | ICD-10-CM

## 2020-10-17 DIAGNOSIS — R911 Solitary pulmonary nodule: Secondary | ICD-10-CM | POA: Insufficient documentation

## 2020-10-17 DIAGNOSIS — D649 Anemia, unspecified: Secondary | ICD-10-CM | POA: Insufficient documentation

## 2020-10-17 DIAGNOSIS — E538 Deficiency of other specified B group vitamins: Secondary | ICD-10-CM | POA: Insufficient documentation

## 2020-10-17 LAB — COMPREHENSIVE METABOLIC PANEL
ALT: 33 U/L (ref 0–44)
AST: 29 U/L (ref 15–41)
Albumin: 4.3 g/dL (ref 3.5–5.0)
Alkaline Phosphatase: 65 U/L (ref 38–126)
Anion gap: 10 (ref 5–15)
BUN: 24 mg/dL — ABNORMAL HIGH (ref 8–23)
CO2: 25 mmol/L (ref 22–32)
Calcium: 9.3 mg/dL (ref 8.9–10.3)
Chloride: 103 mmol/L (ref 98–111)
Creatinine, Ser: 0.94 mg/dL (ref 0.61–1.24)
GFR, Estimated: >60 mL/min (ref >60)
Glucose, Bld: 129 mg/dL — ABNORMAL HIGH (ref 70–99)
Potassium: 3.9 mmol/L (ref 3.5–5.1)
Sodium: 138 mmol/L (ref 135–145)
Total Bilirubin: 1.9 mg/dL — ABNORMAL HIGH (ref 0.3–1.2)
Total Protein: 7.1 g/dL (ref 6.5–8.1)

## 2020-10-17 LAB — CBC WITH DIFFERENTIAL/PLATELET
Abs Immature Granulocytes: 0.04 10*3/uL (ref 0.00–0.07)
Basophils Absolute: 0 10*3/uL (ref 0.0–0.1)
Basophils Relative: 0 %
Eosinophils Absolute: 0.1 10*3/uL (ref 0.0–0.5)
Eosinophils Relative: 2 %
HCT: 37.5 % — ABNORMAL LOW (ref 39.0–52.0)
Hemoglobin: 12.9 g/dL — ABNORMAL LOW (ref 13.0–17.0)
Immature Granulocytes: 1 %
Lymphocytes Relative: 16 %
Lymphs Abs: 1.3 10*3/uL (ref 0.7–4.0)
MCH: 33.3 pg (ref 26.0–34.0)
MCHC: 34.4 g/dL (ref 30.0–36.0)
MCV: 96.9 fL (ref 80.0–100.0)
Monocytes Absolute: 0.6 10*3/uL (ref 0.1–1.0)
Monocytes Relative: 7 %
Neutro Abs: 5.9 10*3/uL (ref 1.7–7.7)
Neutrophils Relative %: 74 %
Platelets: 171 10*3/uL (ref 150–400)
RBC: 3.87 MIL/uL — ABNORMAL LOW (ref 4.22–5.81)
RDW: 13.2 % (ref 11.5–15.5)
WBC: 8 10*3/uL (ref 4.0–10.5)
nRBC: 0 % (ref 0.0–0.2)

## 2020-10-17 LAB — FERRITIN: Ferritin: 386 ng/mL — ABNORMAL HIGH (ref 24–336)

## 2020-10-17 LAB — RETIC PANEL
Immature Retic Fract: 10.1 % (ref 2.3–15.9)
RBC.: 3.85 MIL/uL — ABNORMAL LOW (ref 4.22–5.81)
Retic Count, Absolute: 96.3 10*3/uL (ref 19.0–186.0)
Retic Ct Pct: 2.5 % (ref 0.4–3.1)
Reticulocyte Hemoglobin: 37.3 pg (ref >27.9)

## 2020-10-17 LAB — PSA: Prostatic Specific Antigen: <0.01 ng/mL (ref 0.00–4.00)

## 2020-10-17 LAB — IRON AND TIBC
Iron: 144 ug/dL (ref 45–182)
Saturation Ratios: 41 % — ABNORMAL HIGH (ref 17.9–39.5)
TIBC: 333 ug/dL (ref 250–450)
UIBC: 196 ug/dL

## 2020-10-17 LAB — FOLATE: Folate: >100 ng/mL (ref >5.9)

## 2020-10-17 LAB — VITAMIN B12: Vitamin B-12: 163 pg/mL — ABNORMAL LOW (ref 180–914)

## 2020-10-17 MED ORDER — VITAMIN B-12 1000 MCG PO TABS: 1000.0000 ug | ORAL_TABLET | Freq: Every day | ORAL | 1 refills | Status: AC

## 2020-10-17 NOTE — Progress Notes (Addendum)
Hematology/Oncology follow up note [facility] [phone] [phone]   Patient Care Team: Cletis Athens, MD as PCP - General (Internal Medicine) Noreene Filbert, MD as Radiation Oncologist (Radiation Oncology)  REFERRING PROVIDER: Cletis Athens, MD  CHIEF COMPLAINTS/REASON FOR VISIT:  Follow-up for prostate cancer  HISTORY OF PRESENTING ILLNESS:   Mathew Martinez is a  86 y.o.  male with PMH listed below was seen in consultation at the request of  Cletis Athens, MD  for evaluation of prostate cancer Patient was seen by urologist Dr. Bernardo Heater in February for evaluation of elevated PSA.  PSA drawn in November 2020 was elevated at 11.4.  No previous PSA results available.  Per patient, PSA typically fluctuates between 4 and 7 in the past. Also reports remote prostate biopsy and prostate surgery many years ago.  Patient denies any lower urinary tract symptoms.  A repeat PSA was drawn at Dr. Clydene Laming office and that level increased to 15. 12/15/2019, patient underwent prostate biopsy Pathology report was scanned in epic. Left prostate biopsies showed benign pathology. Right base, right mid, right lateral base, right lateral mid, right lateral apex biopsies were positive for adenocarcinoma. 4 out of the 5 biopsies have Gleason score 7 (4+3), right apex has Gleason score 8 (4+4).  Patient was recommended by urologist to complete staging images.  Patient prefers to be referred to cancer center for counseling and management.  Today patient was accompanied by his wife.  He reports feeling well at baseline.  No urinary symptoms. Denies any fever, chills, unintentional weight loss, abdominal pain, bone pain. He is quite active at baseline.  He does push-up exercise.  -01/12/2020 CT chest abdomen pelvis  1.8 cm right lower lobe pulmonary nodule, metastatic lung disease versus primary lung cancer.  Otherwise no potential findings of metastatic disease in the  chest abdomen or pelvis.  Patient has three-vessel coronary office marked diffuse colonic diverticulosis.  Emphysema - PET Rectal normal lung nodule has very low FDG activity on PET scan, likely not metastatic prostate cancer. Low-grade primary lung cancer-ie, adenocarcinoma cannot be ruled out.  Patient has history of tobacco use   Patient has life expectancy> 5 years,I recommend radiation plus androgen deprivation therapy for 1-3 years.  Rationale and potential side effects of androgen deprivation therapy were discussed with patient.  #  2 doses of Firmagon with last dose of Firmagon on 02/18/2020. # switched to Eligard on 03/17/2020.  # For his 1.8 cm right lower lobe lung nodule, CT shows stable size.  Radonc recommend to continue observation rather than SBRT now. INTERVAL HISTORY Mathew Martinez is a 86 y.o. male who has above history reviewed by me today presents for follow up visit for management of prostate cancer Problems and complaints are listed below: Patient reports that has felt better for the past few days.  Left lower extremity swelling. He has also had right shoulder pain, status post cortisone shot and the symptom has improved No additional episodes of lip swelling.  Lisinopril has been discontinued and switched to other medications and his blood pressure has been stable.    Review of Systems  Constitutional: Negative for appetite change, chills, fatigue, fever and unexpected weight change.  HENT:   Negative for hearing loss and voice change.   Eyes: Negative for eye problems and icterus.  Respiratory: Negative for chest tightness, cough and shortness of breath.   Cardiovascular: Negative for chest pain and leg swelling.  Gastrointestinal: Negative for abdominal distention and abdominal  pain.  Endocrine: Negative for hot flashes.  Genitourinary: Negative for difficulty urinating, dysuria and frequency.   Musculoskeletal: Negative for arthralgias.  Skin: Negative for  itching and rash.  Neurological: Negative for light-headedness and numbness.  Hematological: Negative for adenopathy. Does not bruise/bleed easily.  Psychiatric/Behavioral: Negative for confusion ( ).    MEDICAL HISTORY:  Past Medical History:  Diagnosis Date  . Arthritis    wrist  . Basal cell carcinoma 02/25/2013   Left mastoid area. Excised, margins free.  . Basal cell carcinoma 03/04/2013   Left posterior shoulder. Excised, margins free.  [name] BPH (benign prostatic hyperplasia)   . Dysplastic nevus 09/14/2013   Right upper back post. shoulder. Moderate to severe atypia, close to margin.  [name] Dysplastic nevus 09/14/2013   Right sup. popliteal. Moderate atypia, lateral and deep margins involved.  [name] Dysplastic nevus 09/14/2013   Right proximal med. calf. Moderate atypia, deep margin involved.   [name] Dysplastic nevus 09/14/2013   Right distal med. calf. Moderate atypia, lateral and deep margins involved.   . Hard of hearing   . Heart murmur   . History of basal cell carcinoma 02/18/2013   Right posterior wrist. Excised, margins free.  [name] History of melanoma 01/14/2013   left upper back  . Hyperlipemia   . Hypertension   . Prostate cancer ([place]) 12/29/2019    SURGICAL HISTORY: Past Surgical History:  Procedure Laterality Date  . APPENDECTOMY    . CATARACT EXTRACTION W/PHACO Right 03/19/2018   Procedure: CATARACT EXTRACTION PHACO AND INTRAOCULAR LENS PLACEMENT ([place]) COMPLICATED RIGHT;  Surgeon: Leandrew Koyanagi, MD;  Location: [place];  Service: Ophthalmology;  Laterality: Right;  [place]    . COLONOSCOPY WITH PROPOFOL N/A 07/15/2015   Procedure: COLONOSCOPY WITH PROPOFOL;  Surgeon: Josefine Class, MD;  Location: [facility] ENDOSCOPY;  Service: Endoscopy;  Laterality: N/A;  . Duodenojejunostomy    . HERNIA REPAIR    . prostate tissue removal  1996    SOCIAL HISTORY: Social History   Socioeconomic History  . Marital status: Married    Spouse  name: Not on file  . Number of children: Not on file  . Years of education: Not on file  . Highest education level: Not on file  Occupational History  . Occupation: retired  Tobacco Use  . Smoking status: Former Smoker    Packs/day: 0.50    Years: 50.00    Pack years: 25.00    Quit date: 12/29/1999    Years since quitting: 20.8  . Smokeless tobacco: Never Used  Vaping Use  . Vaping Use: Never used  Substance and Sexual Activity  . Alcohol use: Never  . Drug use: Never  . Sexual activity: Not on file  Other Topics Concern  . Not on file  Social History Narrative  . Not on file   Social Determinants of Health   Financial Resource Strain: Not on file  Food Insecurity: Not on file  Transportation Needs: Not on file  Physical Activity: Not on file  Stress: Not on file  Social Connections: Not on file  Intimate Partner Violence: Not on file    FAMILY HISTORY: Family History  Problem Relation Age of Onset  . Heart disease Mother   . Diabetes Mother   . Cancer Father   . Prostate cancer Brother     ALLERGIES:  is allergic to lisinopril.  MEDICATIONS:  Current Outpatient Medications  Medication Sig Dispense Refill  . b complex vitamins capsule Take 1  capsule by mouth daily.    . Calcium Carbonate-Vit D-Min (CALCIUM 1200 PO) Take 600 mg by mouth.    . Cholecalciferol 25 MCG (1000 UT) tablet Take 1,000 Units by mouth daily.    [name] co-enzyme Q-10 30 MG capsule Take 30 mg by mouth daily.    [name] doxazosin (CARDURA) 4 MG tablet Take 1 tablet (4 mg total) by mouth daily. 30 tablet 6  . ferrous sulfate 325 (65 FE) MG tablet Take 65 mg by mouth daily with breakfast.     . ketoconazole (NIZORAL) 2 % cream Apply to the feet QHS 60 g 3  . loratadine (CLARITIN) 10 MG tablet Take 1 tablet (10 mg total) by mouth daily. 30 tablet 11  . lovastatin (MEVACOR) 20 MG tablet Take 1 tablet (20 mg total) by mouth at bedtime. 90 tablet 3  . metoprolol succinate (TOPROL-XL) 25 MG 24 hr tablet Take  1 tablet (25 mg total) by mouth daily. 90 tablet 3  . Multiple Vitamins-Minerals (MULTIVITAMIN WITH MINERALS) tablet Take 1 tablet by mouth daily.    . Omega-3 Fatty Acids (FISH OIL) 500 MG CAPS Take 500 mg by mouth.    . TURMERIC CURCUMIN PO Take 550 mg by mouth.    . vitamin C (ASCORBIC ACID) 500 MG tablet Take 500 mg by mouth daily.     No current facility-administered medications for this visit.     PHYSICAL EXAMINATION: ECOG PERFORMANCE STATUS: 0 - Asymptomatic Vitals:   10/17/20 1031  BP: 111/70  Pulse: 66  Resp: 16  Temp: (!) 97.3 F (36.3 C)   Filed Weights   10/17/20 1031  Weight: 141 lb 11.2 oz (64.3 kg)    Physical Exam Constitutional:      General: He is not in acute distress. HENT:     Head: Normocephalic and atraumatic.  Eyes:     General: No scleral icterus. Cardiovascular:     Rate and Rhythm: Normal rate and regular rhythm.     Heart sounds: Normal heart sounds.  Pulmonary:     Effort: Pulmonary effort is normal. No respiratory distress.     Breath sounds: No wheezing.  Abdominal:     General: Bowel sounds are normal. There is no distension.     Palpations: Abdomen is soft.  Musculoskeletal:        General: No deformity. Normal range of motion.     Cervical back: Normal range of motion and neck supple.  Skin:    General: Skin is warm and dry.     Findings: No erythema or rash.  Neurological:     Mental Status: He is alert and oriented to person, place, and time. Mental status is at baseline.     Cranial Nerves: No cranial nerve deficit.     Coordination: Coordination normal.  Psychiatric:        Mood and Affect: Mood normal.       LABORATORY DATA:  I have reviewed the data as listed Lab Results  Component Value Date   WBC 8.0 10/17/2020   HGB 12.9 (L) 10/17/2020   HCT 37.5 (L) 10/17/2020   MCV 96.9 10/17/2020   PLT 171 10/17/2020   Recent Labs    12/29/19 1050 02/18/20 0936 04/27/20 1004 07/19/20 0936 10/17/20 0924  NA  --   132* 137 138 138  K  --  4.3 4.7 4.1 3.9  CL  --  98 101 103 103  CO2  --  25 27 26 25   GLUCOSE  --  129* 125* 156* 129*  BUN  --  18 25* 15 24*  CREATININE  --  0.91 1.13 1.00 0.94  CALCIUM  --  8.8* 9.0 8.7* 9.3  GFRNONAA  --  >60 58* >60 >60  GFRAA  --  >60 >60 >60  --   PROT  --  6.9 7.1 6.5 7.1  ALBUMIN  --  4.0 4.1 3.9 4.3  AST  --  24 31 27 29   ALT  --  26 36 28 33  ALKPHOS  --  69 69 58 65  BILITOT  --  1.4* 1.4* 1.2 1.9*  BILIDIR 0.3*  --   --   --   --    Iron/TIBC/Ferritin/ %Sat    Component Value Date/Time   IRON 144 10/17/2020 0924   TIBC 333 10/17/2020 0924   FERRITIN 386 (H) 10/17/2020 0924   IRONPCTSAT 41 (H) 10/17/2020 10/19/2020      RADIOGRAPHIC STUDIES: I have personally reviewed the radiological images as listed and agreed with the findings in the report. No results found.    ASSESSMENT & PLAN:  1. Prostate cancer (HCC)   2. Normocytic anemia   3. Thrombocytopenia (HCC)   4. B12 deficiency   5. Androgen deprivation therapy   T2bN0M0 prostate cancer was discussed. #Risk stratification Grade group 4/Gleason 8 (4+4), PSA 15,  high risk group.   Finished radiation iron recommend continue androgen deprivation therapy for 1 to 3 years.  Continue Eligard and the next dose is due in January 2022. I will see patient 4 months after his January injection. PSA is < 0.01  #Lung nodule, cannot rule out underlying malignancy.   05/04/2020, CT chest with contrast showed interval stability of the 1.8 cm pulmonary nodule.  Continue surveillance.  Repeat CT scan in 12 to 24 months.-Plan to repeat CT chest in July 2022.  #Chronic anemia, normocytic.  Iron panel is not consistent with iron deficiency.  Indeed, iron saturation is persistently elevated as well as ferritin level.  I will check hemochromatosis panel at the next visit.  #Vitamin B12 deficiency, B12 level has decreased to 163.  I recommend patient to start oral vitamin B12 supplementation 1000 MCG daily.   Repeat B12 level at the next visit.  Orders Placed This Encounter  Procedures  . Vitamin B12    Standing Status:   Future    Standing Expiration Date:   10/17/2021  . Comprehensive metabolic panel    Standing Status:   Future    Standing Expiration Date:   10/17/2021  . CBC with Differential/Platelet    Standing Status:   Future    Standing Expiration Date:   10/17/2021  . Hemochromatosis DNA-PCR(c282y,h63d)    Standing Status:   Future    Standing Expiration Date:   10/17/2021    All questions were answered. The patient knows to call the clinic with any problems questions or concerns.   Return of visit: September 2021 October 2021, MD, PhD Hematology Oncology [facility] Pager- [facility] 10/17/2020

## 2020-10-17 NOTE — Addendum Note (Signed)
Addended by: Rickard Patience on: 10/17/2020 05:25 PM   Modules accepted: Orders

## 2020-10-17 NOTE — Progress Notes (Signed)
Patient would like to discuss how long he will be getting Eligard.

## 2020-10-18 ENCOUNTER — Telehealth: Payer: Self-pay

## 2020-10-18 NOTE — Telephone Encounter (Signed)
-----   Message from Rickard Patience, MD sent at 10/17/2020  5:26 PM EST ----- Please let him know that vitamin B12 level is low.  I recommend patient to start oral vitamin B12 supplementation.  Prescription was sent to pharmacy.

## 2020-10-18 NOTE — Telephone Encounter (Signed)
Message left on home phone informing patient of results.

## 2020-10-31 ENCOUNTER — Other Ambulatory Visit: Payer: Self-pay

## 2020-10-31 ENCOUNTER — Ambulatory Visit (INDEPENDENT_AMBULATORY_CARE_PROVIDER_SITE_OTHER): Payer: Medicare HMO | Admitting: Internal Medicine

## 2020-10-31 ENCOUNTER — Encounter: Payer: Self-pay | Admitting: Internal Medicine

## 2020-10-31 VITALS — BP 114/66 | HR 71 | Ht 69.0 in | Wt 142.6 lb

## 2020-10-31 DIAGNOSIS — C61 Malignant neoplasm of prostate: Secondary | ICD-10-CM | POA: Diagnosis not present

## 2020-10-31 DIAGNOSIS — I10 Essential (primary) hypertension: Secondary | ICD-10-CM

## 2020-10-31 DIAGNOSIS — E7800 Pure hypercholesterolemia, unspecified: Secondary | ICD-10-CM | POA: Diagnosis not present

## 2020-10-31 DIAGNOSIS — S43421A Sprain of right rotator cuff capsule, initial encounter: Secondary | ICD-10-CM

## 2020-10-31 DIAGNOSIS — J301 Allergic rhinitis due to pollen: Secondary | ICD-10-CM

## 2020-10-31 NOTE — Assessment & Plan Note (Signed)
-   Today, the patient's blood pressure is well managed on current  treatment. - The patient will continue the current treatment regimen.  - I encouraged the patient to eat a low-sodium diet to help control blood pressure. - I encouraged the patient to live an active lifestyle and complete activities that increases heart rate to 85% target heart rate at least 5 times per week for one hour.

## 2020-10-31 NOTE — Assessment & Plan Note (Signed)
Prostate cancer is stable he is not having any symptoms right now

## 2020-10-31 NOTE — Assessment & Plan Note (Signed)
regimen.  - I encouraged the patient to eat more vegetables and whole wheat, and to avoid fatty foods like whole milk, hard cheese, egg yolks, margarine, baked sweets, and fried foods.  - I encouraged the patient to live an active lifestyle and complete activities for 40 minutes at least three times per week.  - I instructed the patient to go to the ER if they begin having chest pain.

## 2020-10-31 NOTE — Progress Notes (Signed)
Established Patient Office Visit  Subjective:  Patient ID: Mathew Martinez, male    DOB: [birth year]  Age: 87 y.o. MRN: [id number]  CC:  Chief Complaint  Patient presents with  . Shoulder Pain    HPI  Mathew Martinez presents fo shoulder pain  Past Medical History:  Diagnosis Date  . Arthritis    wrist  . Basal cell carcinoma 02/25/2013   Left mastoid area. Excised, margins free.  . Basal cell carcinoma 03/04/2013   Left posterior shoulder. Excised, margins free.  [name] BPH (benign prostatic hyperplasia)   . Dysplastic nevus 09/14/2013   Right upper back post. shoulder. Moderate to severe atypia, close to margin.  [name] Dysplastic nevus 09/14/2013   Right sup. popliteal. Moderate atypia, lateral and deep margins involved.  [name] Dysplastic nevus 09/14/2013   Right proximal med. calf. Moderate atypia, deep margin involved.   [name] Dysplastic nevus 09/14/2013   Right distal med. calf. Moderate atypia, lateral and deep margins involved.   . Hard of hearing   . Heart murmur   . History of basal cell carcinoma 02/18/2013   Right posterior wrist. Excised, margins free.  [name] History of melanoma 01/14/2013   left upper back  . Hyperlipemia   . Hypertension   . Prostate cancer ([place]) 12/29/2019    Past Surgical History:  Procedure Laterality Date  . APPENDECTOMY    . CATARACT EXTRACTION W/PHACO Right 03/19/2018   Procedure: CATARACT EXTRACTION PHACO AND INTRAOCULAR LENS PLACEMENT ([place]) COMPLICATED RIGHT;  Surgeon: Leandrew Koyanagi, MD;  Location: [place];  Service: Ophthalmology;  Laterality: Right;  [place]    . COLONOSCOPY WITH PROPOFOL N/A 07/15/2015   Procedure: COLONOSCOPY WITH PROPOFOL;  Surgeon: Josefine Class, MD;  Location: [facility] ENDOSCOPY;  Service: Endoscopy;  Laterality: N/A;  . Duodenojejunostomy    . HERNIA REPAIR    . prostate tissue removal  1996    Family History  Problem Relation Age of Onset  . Heart disease Mother   . Diabetes  Mother   . Cancer Father   . Prostate cancer Brother     Social History   Socioeconomic History  . Marital status: Married    Spouse name: Not on file  . Number of children: Not on file  . Years of education: Not on file  . Highest education level: Not on file  Occupational History  . Occupation: retired  Tobacco Use  . Smoking status: Former Smoker    Packs/day: 0.50    Years: 50.00    Pack years: 25.00    Quit date: 12/29/1999    Years since quitting: 20.8  . Smokeless tobacco: Never Used  Vaping Use  . Vaping Use: Never used  Substance and Sexual Activity  . Alcohol use: Never  . Drug use: Never  . Sexual activity: Not on file  Other Topics Concern  . Not on file  Social History Narrative  . Not on file   Social Determinants of Health   Financial Resource Strain: Not on file  Food Insecurity: Not on file  Transportation Needs: Not on file  Physical Activity: Not on file  Stress: Not on file  Social Connections: Not on file  Intimate Partner Violence: Not on file     Current Outpatient Medications:  .  b complex vitamins capsule, Take 1 capsule by mouth daily., Disp: , Rfl:  .  Calcium Carbonate-Vit D-Min (CALCIUM 1200 PO), Take 600 mg by mouth., Disp: , Rfl:  .  Cholecalciferol 25 MCG (1000 UT) tablet, Take 1,000 Units by mouth daily., Disp: , Rfl:  .  co-enzyme Q-10 30 MG capsule, Take 30 mg by mouth daily., Disp: , Rfl:  .  doxazosin (CARDURA) 4 MG tablet, Take 1 tablet (4 mg total) by mouth daily., Disp: 30 tablet, Rfl: 6 .  ferrous sulfate 325 (65 FE) MG tablet, Take 65 mg by mouth daily with breakfast. , Disp: , Rfl:  .  ketoconazole (NIZORAL) 2 % cream, Apply to the feet QHS, Disp: 60 g, Rfl: 3 .  loratadine (CLARITIN) 10 MG tablet, Take 1 tablet (10 mg total) by mouth daily., Disp: 30 tablet, Rfl: 11 .  lovastatin (MEVACOR) 20 MG tablet, Take 1 tablet (20 mg total) by mouth at bedtime., Disp: 90 tablet, Rfl: 3 .  metoprolol succinate (TOPROL-XL) 25 MG  24 hr tablet, Take 1 tablet (25 mg total) by mouth daily., Disp: 90 tablet, Rfl: 3 .  Multiple Vitamins-Minerals (MULTIVITAMIN WITH MINERALS) tablet, Take 1 tablet by mouth daily., Disp: , Rfl:  .  Omega-3 Fatty Acids (FISH OIL) 500 MG CAPS, Take 500 mg by mouth., Disp: , Rfl:  .  TURMERIC CURCUMIN PO, Take 550 mg by mouth., Disp: , Rfl:  .  vitamin B-12 (CYANOCOBALAMIN) 1000 MCG tablet, Take 1 tablet (1,000 mcg total) by mouth daily., Disp: 90 tablet, Rfl: 1 .  vitamin C (ASCORBIC ACID) 500 MG tablet, Take 500 mg by mouth daily., Disp: , Rfl:    Allergies  Allergen Reactions  . Lisinopril Other (See Comments)    Face Swelling    ROS Review of Systems  Constitutional: Negative.   HENT: Negative.   Eyes: Negative.   Respiratory: Negative.   Cardiovascular: Negative.   Gastrointestinal: Negative.   Endocrine: Negative.   Genitourinary: Negative.   Musculoskeletal: Negative.        Pain in the right shoulder has decreased.  Skin: Negative.   Allergic/Immunologic: Negative.   Neurological: Negative.   Hematological: Negative.   Psychiatric/Behavioral: Negative.   All other systems reviewed and are negative.     Objective:    Physical Exam Vitals reviewed.  Constitutional:      Appearance: Normal appearance.  HENT:     Mouth/Throat:     Mouth: Mucous membranes are moist.  Eyes:     Pupils: Pupils are equal, round, and reactive to light.  Neck:     Vascular: No carotid bruit.  Cardiovascular:     Rate and Rhythm: Normal rate and regular rhythm.     Pulses: Normal pulses.     Heart sounds: Normal heart sounds.  Pulmonary:     Effort: Pulmonary effort is normal.     Breath sounds: Normal breath sounds.  Abdominal:     General: Bowel sounds are normal.     Palpations: Abdomen is soft. There is no hepatomegaly, splenomegaly or mass.     Tenderness: There is no abdominal tenderness.     Hernia: No hernia is present.  Musculoskeletal:        General: Normal range of  motion.     Cervical back: Neck supple.     Right lower leg: No edema.     Left lower leg: No edema.     Comments: Patient movement of the right shoulder is less painful.  There is no swelling or tenderness.  Skin:    Findings: No rash.  Neurological:     Mental Status: He is alert and oriented to person, place, and time.  Motor: No weakness.  Psychiatric:        Mood and Affect: Mood normal.        Behavior: Behavior normal.     There were no vitals taken for this visit. Wt Readings from Last 3 Encounters:  10/17/20 141 lb 11.2 oz (64.3 kg)  10/04/20 145 lb 14.4 oz (66.2 kg)  07/19/20 145 lb 12.8 oz (66.1 kg)     Health Maintenance Due  Topic Date Due  . TETANUS/TDAP  Never done  . PNA vac Low Risk Adult (1 of 2 - PCV13) Never done  . COVID-19 Vaccine (3 - Pfizer risk 4-dose series) 12/19/2019    There are no preventive care reminders to display for this patient.  Lab Results  Component Value Date   TSH 1.429 02/18/2020   Lab Results  Component Value Date   WBC 8.0 10/17/2020   HGB 12.9 (L) 10/17/2020   HCT 37.5 (L) 10/17/2020   MCV 96.9 10/17/2020   PLT 171 10/17/2020   Lab Results  Component Value Date   NA 138 10/17/2020   K 3.9 10/17/2020   CO2 25 10/17/2020   GLUCOSE 129 (H) 10/17/2020   BUN 24 (H) 10/17/2020   CREATININE 0.94 10/17/2020   BILITOT 1.9 (H) 10/17/2020   ALKPHOS 65 10/17/2020   AST 29 10/17/2020   ALT 33 10/17/2020   PROT 7.1 10/17/2020   ALBUMIN 4.3 10/17/2020   CALCIUM 9.3 10/17/2020   ANIONGAP 10 10/17/2020   No results found for: CHOL No results found for: HDL No results found for: LDLCALC No results found for: TRIG No results found for: CHOLHDL No results found for: HGBA1C    Assessment & Plan:   Problem List Items Addressed This Visit      Cardiovascular and Mediastinum   Essential hypertension - Primary    - Today, the patient's blood pressure is well managed on current  treatment. - The patient will continue  the current treatment regimen.  - I encouraged the patient to eat a low-sodium diet to help control blood pressure. - I encouraged the patient to live an active lifestyle and complete activities that increases heart rate to 85% target heart rate at least 5 times per week for one hour.            Respiratory   Seasonal allergic rhinitis due to pollen    Patient was advised to take Claritin 5 mg as needed for allergy.        Musculoskeletal and Integument   Sprain of right rotator cuff capsule    Sprain of the right shoulder is much better patient conveys his right arm above the shoulder.  He was advised to do shoulder exercises.        Genitourinary   Prostate cancer [facility])    Prostate cancer is stable he is not having any symptoms right now        Other   Hyperlipidemia     regimen.  - I encouraged the patient to eat more vegetables and whole wheat, and to avoid fatty foods like whole milk, hard cheese, egg yolks, margarine, baked sweets, and fried foods.  - I encouraged the patient to live an active lifestyle and complete activities for 40 minutes at least three times per week.  - I instructed the patient to go to the ER if they begin having chest pain.            No orders of the defined types were placed  in this encounter.   Follow-up: No follow-ups on file.    Cletis Athens, MD

## 2020-10-31 NOTE — Assessment & Plan Note (Signed)
Patient was advised to take Claritin 5 mg as needed for allergy.

## 2020-10-31 NOTE — Assessment & Plan Note (Signed)
Sprain of the right shoulder is much better patient conveys his right arm above the shoulder.  He was advised to do shoulder exercises.

## 2020-11-01 ENCOUNTER — Telehealth: Payer: Self-pay | Admitting: *Deleted

## 2020-11-01 NOTE — Telephone Encounter (Signed)
Patient called and left message on voice mail that he is having some urinating problems, I called patient back for more information, but it went to voice mail and I left message to call me back

## 2020-11-01 NOTE — Telephone Encounter (Signed)
Patient called back stating that he is having slow stream and is not going much when he goes. He is drinking 6 - 10 glasses of fluid per day. I asked if he had contacted Dr Dene Gentry office about this and he said that he had not, so I advised that he contact them and that if they think we need to see him to call me back. He is in agreement with this plan.

## 2020-11-02 ENCOUNTER — Other Ambulatory Visit: Payer: Self-pay

## 2020-11-02 ENCOUNTER — Encounter: Payer: Self-pay | Admitting: Urology

## 2020-11-02 ENCOUNTER — Ambulatory Visit: Payer: Medicare HMO | Admitting: Urology

## 2020-11-02 VITALS — BP 125/76 | HR 78 | Ht 69.0 in | Wt 142.0 lb

## 2020-11-02 DIAGNOSIS — C61 Malignant neoplasm of prostate: Secondary | ICD-10-CM | POA: Diagnosis not present

## 2020-11-02 DIAGNOSIS — R399 Unspecified symptoms and signs involving the genitourinary system: Secondary | ICD-10-CM

## 2020-11-02 DIAGNOSIS — R39198 Other difficulties with micturition: Secondary | ICD-10-CM | POA: Diagnosis not present

## 2020-11-02 LAB — BLADDER SCAN AMB NON-IMAGING

## 2020-11-02 MED ORDER — SILODOSIN 8 MG PO CAPS
8.0000 mg | ORAL_CAPSULE | Freq: Every day | ORAL | 1 refills | Status: DC
Start: 2020-11-02 — End: 2021-03-29

## 2020-11-02 NOTE — Progress Notes (Signed)
11/02/2020 3:24 PM   Mathew Martinez Mar 30, 1931 [id number]  Referring provider: Cletis Athens, MD [address] [place],  [place] [address]  Chief Complaint  Patient presents with  . Urinary Retention    HPI: 87 y.o. male presents for an acute visit for lower urinary tract symptoms.   Last seen 11/2019 for an elevated PSA and prostate biopsy remarkable for high risk adenocarcinoma  Had 5/6 right sided cores positive for Gleason 4+3 and one core from the right apex Gleason 4+4 adenocarcinoma.  All left-sided cores showed benign prostate tissue.  Was referred to medical oncology and IMRT + ADT was recommended  Completed IMRT 03/22/2020  Remains on ADT; last PSA 10/17/2020 <0.01  Recently developed bothersome lower urinary tract symptoms including decreased force and caliber of his urinary stream and postvoid dribbling  Denies dysuria, gross hematuria  No flank, abdominal or pelvic pain   PMH: Past Medical History:  Diagnosis Date  . Arthritis    wrist  . Basal cell carcinoma 02/25/2013   Left mastoid area. Excised, margins free.  . Basal cell carcinoma 03/04/2013   Left posterior shoulder. Excised, margins free.  [name] BPH (benign prostatic hyperplasia)   . Dysplastic nevus 09/14/2013   Right upper back post. shoulder. Moderate to severe atypia, close to margin.  [name] Dysplastic nevus 09/14/2013   Right sup. popliteal. Moderate atypia, lateral and deep margins involved.  [name] Dysplastic nevus 09/14/2013   Right proximal med. calf. Moderate atypia, deep margin involved.   [name] Dysplastic nevus 09/14/2013   Right distal med. calf. Moderate atypia, lateral and deep margins involved.   . Hard of hearing   . Heart murmur   . History of basal cell carcinoma 02/18/2013   Right posterior wrist. Excised, margins free.  [name] History of melanoma 01/14/2013   left upper back  . Hyperlipemia   . Hypertension   . Prostate cancer ([place]) 12/29/2019    Surgical History: Past Surgical History:   Procedure Laterality Date  . APPENDECTOMY    . CATARACT EXTRACTION W/PHACO Right 03/19/2018   Procedure: CATARACT EXTRACTION PHACO AND INTRAOCULAR LENS PLACEMENT ([place]) COMPLICATED RIGHT;  Surgeon: Leandrew Koyanagi, MD;  Location: [facility];  Service: Ophthalmology;  Laterality: Right;  [place]    . COLONOSCOPY WITH PROPOFOL N/A 07/15/2015   Procedure: COLONOSCOPY WITH PROPOFOL;  Surgeon: Josefine Class, MD;  Location[facility] ENDOSCOPY;  Service: Endoscopy;  Laterality: N/A;  . Duodenojejunostomy    . HERNIA REPAIR    . prostate tissue removal  1996    Home Medications:  Allergies as of 11/02/2020      Reactions   Lisinopril Other (See Comments)   Face Swelling      Medication List       Accurate as of November 02, 2020  3:24 PM. If you have any questions, ask your nurse or doctor.        b complex vitamins capsule Take 1 capsule by mouth daily.   CALCIUM 1200 PO Take 600 mg by mouth.   Cholecalciferol 25 MCG (1000 UT) tablet Take 1,000 Units by mouth daily.   co-enzyme Q-10 30 MG capsule Take 30 mg by mouth daily.   doxazosin 4 MG tablet Commonly known as: Cardura Take 1 tablet (4 mg total) by mouth daily.   ferrous sulfate 325 (65 FE) MG tablet Take 65 mg by mouth daily with breakfast.   Fish Oil 500 MG Caps Take 500 mg by mouth.   ketoconazole 2 %  cream Commonly known as: NIZORAL Apply to the feet QHS   loratadine 10 MG tablet Commonly known as: CLARITIN Take 1 tablet (10 mg total) by mouth daily.   lovastatin 20 MG tablet Commonly known as: MEVACOR Take 1 tablet (20 mg total) by mouth at bedtime.   metoprolol succinate 25 MG 24 hr tablet Commonly known as: TOPROL-XL Take 1 tablet (25 mg total) by mouth daily.   multivitamin with minerals tablet Take 1 tablet by mouth daily.   silodosin 8 MG Caps capsule Commonly known as: RAPAFLO Take 1 capsule (8 mg total) by mouth daily with breakfast. Started by: Abbie Sons, MD   TURMERIC CURCUMIN PO Take 550 mg by mouth.   vitamin B-12 1000 MCG tablet Commonly known as: CYANOCOBALAMIN Take 1 tablet (1,000 mcg total) by mouth daily.   vitamin C 500 MG tablet Commonly known as: ASCORBIC ACID Take 500 mg by mouth daily.       Allergies:  Allergies  Allergen Reactions  . Lisinopril Other (See Comments)    Face Swelling    Family History: Family History  Problem Relation Age of Onset  . Heart disease Mother   . Diabetes Mother   . Cancer Father   . Prostate cancer Brother     Social History:  reports that he quit smoking about 20 years ago. He has a 25.00 pack-year smoking history. He has never used smokeless tobacco. He reports that he does not drink alcohol and does not use drugs.   Physical Exam: BP 125/76   Pulse 78   Ht 5\' 9"  (1.753 m)   Wt 142 lb (64.4 kg)   BMI 20.97 kg/m   Constitutional:  Alert and oriented, No acute distress. HEENT: [place] AT, moist mucus membranes.  Trachea midline, no masses. Cardiovascular: No clubbing, cyanosis, or edema. Respiratory: Normal respiratory effort, no increased work of breathing. Neurologic: Grossly intact, no focal deficits, moving all 4 extremities. Psychiatric: Normal mood and affect.  Laboratory Data:  Urinalysis Dipstick/microscopy negative  Assessment & Plan:    1.  Lower urinary tract symptoms  Urinalysis unremarkable  Bladder scan PVR 28 mL  Recommended trial silodosin 8 mg daily  Follow-up 4 weeks for symptom recheck and if still symptomatic at that time will perform cystoscopy at that visit   Abbie Sons, [place] [address] [place], [place] [address] [phone]

## 2020-11-03 LAB — URINALYSIS, COMPLETE
Bilirubin, UA: NEGATIVE
Glucose, UA: NEGATIVE
Ketones, UA: NEGATIVE
Leukocytes,UA: NEGATIVE
Nitrite, UA: NEGATIVE
Protein,UA: NEGATIVE
RBC, UA: NEGATIVE
Specific Gravity, UA: 1.015 (ref 1.005–1.030)
Urobilinogen, Ur: 0.2 mg/dL (ref 0.2–1.0)
pH, UA: 5 (ref 5.0–7.5)

## 2020-11-03 LAB — MICROSCOPIC EXAMINATION
Bacteria, UA: NONE SEEN
Epithelial Cells (non renal): NONE SEEN /hpf (ref 0–10)
RBC, Urine: NONE SEEN /hpf (ref 0–2)

## 2020-11-04 ENCOUNTER — Inpatient Hospital Stay: Payer: Medicare HMO | Attending: Radiation Oncology

## 2020-11-04 DIAGNOSIS — C61 Malignant neoplasm of prostate: Secondary | ICD-10-CM | POA: Diagnosis not present

## 2020-11-04 DIAGNOSIS — Z5111 Encounter for antineoplastic chemotherapy: Secondary | ICD-10-CM | POA: Diagnosis not present

## 2020-11-05 LAB — PSA: Prostatic Specific Antigen: <0.01 ng/mL (ref 0.00–4.00)

## 2020-11-08 ENCOUNTER — Inpatient Hospital Stay: Payer: Medicare HMO

## 2020-11-08 DIAGNOSIS — Z5111 Encounter for antineoplastic chemotherapy: Secondary | ICD-10-CM | POA: Diagnosis not present

## 2020-11-08 DIAGNOSIS — C61 Malignant neoplasm of prostate: Secondary | ICD-10-CM | POA: Diagnosis not present

## 2020-11-08 MED ORDER — LEUPROLIDE ACETATE (4 MONTH) 30 MG ~~LOC~~ KIT
30.0000 mg | PACK | Freq: Once | SUBCUTANEOUS | Status: AC
  Administered 2020-11-08: 30 mg via SUBCUTANEOUS
  Filled 2020-11-08: qty 30

## 2020-11-11 ENCOUNTER — Ambulatory Visit: Payer: Medicare HMO | Admitting: Radiation Oncology

## 2020-11-17 DIAGNOSIS — H26491 Other secondary cataract, right eye: Secondary | ICD-10-CM | POA: Diagnosis not present

## 2020-11-24 ENCOUNTER — Other Ambulatory Visit: Payer: Self-pay | Admitting: *Deleted

## 2020-11-25 ENCOUNTER — Ambulatory Visit
Admission: RE | Admit: 2020-11-25 | Discharge: 2020-11-25 | Disposition: A | Discharge status: Home / Self Care | Payer: Medicare HMO | Source: Ambulatory Visit | Attending: Radiation Oncology | Admitting: Radiation Oncology

## 2020-11-25 ENCOUNTER — Encounter: Payer: Self-pay | Admitting: Radiation Oncology

## 2020-11-25 VITALS — BP 133/64 | HR 70 | Wt 146.0 lb

## 2020-11-25 DIAGNOSIS — C61 Malignant neoplasm of prostate: Secondary | ICD-10-CM | POA: Diagnosis not present

## 2020-11-25 DIAGNOSIS — Z08 Encounter for follow-up examination after completed treatment for malignant neoplasm: Secondary | ICD-10-CM | POA: Diagnosis not present

## 2020-11-28 DIAGNOSIS — H26491 Other secondary cataract, right eye: Secondary | ICD-10-CM | POA: Diagnosis not present

## 2020-11-28 NOTE — Progress Notes (Signed)
Radiation Oncology Follow up Note  Name: Mathew Martinez   Date:   11/25/2020 MRN:  [id number] DOB: 1931/10/16    This 87 y.o. male presents to the clinic today for 50-month follow-up status post IMRT radiation therapy to his prostate and pelvic nodes for Gleason 8 (4+4) adenocarcinoma the prostate presenting with a PSA of 11.4.  REFERRING PROVIDER: Cletis Athens, MD  HPI: Patient is an 87 year old male now out 8 months having completed IMRT radiation therapy for stage IIb adenocarcinoma the prostate seen today in routine follow-up he is doing well. He does have some dribbling slight urge incontinence. Specifically denies any change in his bowels or other GI GU complaints.. His most recent PSA is less than 0.01. We also been tracking a right lower lobe pulmonary nodule which is not PET positive is and is stable over time.  COMPLICATIONS OF TREATMENT: none  FOLLOW UP COMPLIANCE: keeps appointments   PHYSICAL EXAM:  BP 133/64   Pulse 70   Wt 146 lb (66.2 kg)   BMI 21.56 kg/m  Well-developed well-nourished patient in NAD. HEENT reveals PERLA, EOMI, discs not visualized.  Oral cavity is clear. No oral mucosal lesions are identified. Neck is clear without evidence of cervical or supraclavicular adenopathy. Lungs are clear to A&P. Cardiac examination is essentially unremarkable with regular rate and rhythm without murmur rub or thrill. Abdomen is benign with no organomegaly or masses noted. Motor sensory and DTR levels are equal and symmetric in the upper and lower extremities. Cranial nerves II through XII are grossly intact. Proprioception is intact. No peripheral adenopathy or edema is identified. No motor or sensory levels are noted. Crude visual fields are within normal range.  RADIOLOGY RESULTS: No current films to review  PLAN: Present time patient is doing well under excellent biochemical control of his prostate cancer. I have asked him to revisit urology for some of his urinary  complaints. I have asked to see him back in 1 year for follow-up. Patient knows to call with any concerns.  I would like to take this opportunity to thank you for allowing me to participate in the care of your patient.Noreene Filbert, MD

## 2020-11-29 ENCOUNTER — Other Ambulatory Visit: Payer: Self-pay | Admitting: *Deleted

## 2020-11-29 MED ORDER — DOXAZOSIN MESYLATE 4 MG PO TABS: 4.0000 mg | ORAL_TABLET | Freq: Every day | ORAL | 6 refills | Status: DC

## 2020-12-02 ENCOUNTER — Telehealth: Payer: Self-pay

## 2020-12-02 NOTE — Telephone Encounter (Signed)
Pt calls triage line stating that he missed a call, call pt back informed him that phone call he missed was an appt reminder. Pt gave verbal understanding.

## 2020-12-07 ENCOUNTER — Encounter: Payer: Self-pay | Admitting: Urology

## 2020-12-07 ENCOUNTER — Other Ambulatory Visit: Payer: Self-pay

## 2020-12-07 ENCOUNTER — Ambulatory Visit: Payer: Medicare HMO | Admitting: Urology

## 2020-12-07 VITALS — BP 149/71 | HR 77 | Ht 67.0 in | Wt 146.0 lb

## 2020-12-07 DIAGNOSIS — N3941 Urge incontinence: Secondary | ICD-10-CM | POA: Diagnosis not present

## 2020-12-07 DIAGNOSIS — R399 Unspecified symptoms and signs involving the genitourinary system: Secondary | ICD-10-CM | POA: Diagnosis not present

## 2020-12-07 LAB — MICROSCOPIC EXAMINATION
Bacteria, UA: NONE SEEN
RBC, Urine: NONE SEEN /hpf (ref 0–2)
WBC, UA: NONE SEEN /hpf (ref 0–5)

## 2020-12-07 LAB — URINALYSIS, COMPLETE
Bilirubin, UA: NEGATIVE
Glucose, UA: NEGATIVE
Ketones, UA: NEGATIVE
Leukocytes,UA: NEGATIVE
Nitrite, UA: NEGATIVE
Protein,UA: NEGATIVE
RBC, UA: NEGATIVE
Specific Gravity, UA: 1.01 (ref 1.005–1.030)
Urobilinogen, Ur: 0.2 mg/dL (ref 0.2–1.0)
pH, UA: 5 (ref 5.0–7.5)

## 2020-12-07 NOTE — Progress Notes (Signed)
   12/07/20  CC:  Chief Complaint  Patient presents with  . Cysto    HPI: Refer to my previous note 11/02/2020  Notes significant improvement in voiding symptoms on silodosin  Still has some urge incontinence  Blood pressure (!) 149/71, pulse 77, height 5\' 7"  (1.702 m), weight 146 lb (66.2 kg). NED. A&Ox3.   No respiratory distress   Abd soft, NT, ND Normal phallus with bilateral descended testicles  Cystoscopy Procedure Note  Patient identification was confirmed, informed consent was obtained, and patient was prepped using Betadine solution.  Lidocaine jelly was administered per urethral meatus.     Pre-Procedure: - Inspection reveals a normal caliber urethral meatus.  Procedure: The flexible cystoscope was introduced without difficulty - No urethral strictures/lesions are present. - Moderate lateral lobe enlargement prostate  - Normal bladder neck - Bilateral ureteral orifices identified - Bladder mucosa  reveals no ulcers, tumors, or lesions - No bladder stones - No trabeculation  Retroflexion shows no abnormalities   Post-Procedure: - Patient tolerated the procedure well  Assessment/ Plan:  No evidence of urethral stricture  Moderate lateral lobe enlargement  Significant improvement on tamsulosin  He does have persistent storage elated voiding symptoms and discussed adding a beta 3 agonist however he wanted to hold off on additional medication at this time  Follow-up 6 months and instructed to call earlier for any significant change in his voiding pattern   Abbie Sons, MD

## 2020-12-08 ENCOUNTER — Telehealth: Payer: Self-pay | Admitting: *Deleted

## 2020-12-08 NOTE — Telephone Encounter (Signed)
Silodosin approved until 10/21/2021

## 2020-12-27 ENCOUNTER — Ambulatory Visit (INDEPENDENT_AMBULATORY_CARE_PROVIDER_SITE_OTHER): Payer: Medicare HMO | Admitting: Internal Medicine

## 2020-12-27 ENCOUNTER — Encounter: Payer: Self-pay | Admitting: Internal Medicine

## 2020-12-27 ENCOUNTER — Other Ambulatory Visit: Payer: Self-pay

## 2020-12-27 VITALS — BP 108/65 | HR 58 | Ht 69.0 in | Wt 146.8 lb

## 2020-12-27 DIAGNOSIS — I10 Essential (primary) hypertension: Secondary | ICD-10-CM

## 2020-12-27 DIAGNOSIS — C61 Malignant neoplasm of prostate: Secondary | ICD-10-CM

## 2020-12-27 DIAGNOSIS — D696 Thrombocytopenia, unspecified: Secondary | ICD-10-CM

## 2020-12-27 DIAGNOSIS — E7800 Pure hypercholesterolemia, unspecified: Secondary | ICD-10-CM | POA: Diagnosis not present

## 2020-12-27 DIAGNOSIS — D649 Anemia, unspecified: Secondary | ICD-10-CM | POA: Diagnosis not present

## 2020-12-27 DIAGNOSIS — S43421A Sprain of right rotator cuff capsule, initial encounter: Secondary | ICD-10-CM

## 2020-12-27 NOTE — Assessment & Plan Note (Signed)
Patient blood pressure is normal patient denies any chest pain or shortness of breath there is no history of palpitation paroxysmal nocturnal dyspnea patient can walk 2 blocks without any problem patient was advised to follow low-salt low-cholesterol diet 

## 2020-12-27 NOTE — Assessment & Plan Note (Signed)
Patient states that his right shoulder is much better after he received a shot last visit.

## 2020-12-27 NOTE — Assessment & Plan Note (Signed)
-   The patient's hyperlipidemia is stable on med. - The patient will continue the current treatment regimen.  - I encouraged the patient to eat more vegetables and whole wheat, and to avoid fatty foods like whole milk, hard cheese, egg yolks, margarine, baked sweets, and fried foods.  - I encouraged the patient to live an active lifestyle and complete activities for 40 minutes at least three times per week.  - I instructed the patient to go to the ER if they begin having chest pain.

## 2020-12-27 NOTE — Progress Notes (Signed)
Established Patient Office Visit  Subjective:  Patient ID: Mathew Martinez, male    DOB: 1930/12/12  Age: 87 y.o. MRN: [id number]  CC:  Chief Complaint  Patient presents with  . Hypertension    HPI  Mathew Martinez presents for shoulder  Is better, hypertension  Is stable , pt sleep good  And feel good  Past Medical History:  Diagnosis Date  . Arthritis    wrist  . Basal cell carcinoma 02/25/2013   Left mastoid area. Excised, margins free.  . Basal cell carcinoma 03/04/2013   Left posterior shoulder. Excised, margins free.  [name] BPH (benign prostatic hyperplasia)   . Dysplastic nevus 09/14/2013   Right upper back post. shoulder. Moderate to severe atypia, close to margin.  [name] Dysplastic nevus 09/14/2013   Right sup. popliteal. Moderate atypia, lateral and deep margins involved.  [name] Dysplastic nevus 09/14/2013   Right proximal med. calf. Moderate atypia, deep margin involved.   [name] Dysplastic nevus 09/14/2013   Right distal med. calf. Moderate atypia, lateral and deep margins involved.   . Hard of hearing   . Heart murmur   . History of basal cell carcinoma 02/18/2013   Right posterior wrist. Excised, margins free.  [name] History of melanoma 01/14/2013   left upper back  . Hyperlipemia   . Hypertension   . Prostate cancer ([place]) 12/29/2019    Past Surgical History:  Procedure Laterality Date  . APPENDECTOMY    . CATARACT EXTRACTION W/PHACO Right 03/19/2018   Procedure: CATARACT EXTRACTION PHACO AND INTRAOCULAR LENS PLACEMENT ([place]) COMPLICATED RIGHT;  Surgeon: Leandrew Koyanagi, MD;  Location: [name];  Service: Ophthalmology;  Laterality: Right;  [place]    . COLONOSCOPY WITH PROPOFOL N/A 07/15/2015   Procedure: COLONOSCOPY WITH PROPOFOL;  Surgeon: Josefine Class, MD;  Location: [facility] END[facility]COPY;  Service: Endoscopy;  Laterality: N/A;  . Duodenojejunostomy    . HERNIA REPAIR    . prostate tissue removal  1996    Family History   Problem Relation Age of Onset  . Heart disease Mother   . Diabetes Mother   . Cancer Father   . Prostate cancer Brother     Social History   Socioeconomic History  . Marital status: Married    Spouse name: Not on file  . Number of children: Not on file  . Years of education: Not on file  . Highest education level: Not on file  Occupational History  . Occupation: retired  Tobacco Use  . Smoking status: Former Smoker    Packs/day: 0.50    Years: 50.00    Pack years: 25.00    Quit date: 12/29/1999    Years since quitting: 21.0  . Smokeless tobacco: Never Used  Vaping Use  . Vaping Use: Never used  Substance and Sexual Activity  . Alcohol use: Never  . Drug use: Never  . Sexual activity: Not on file  Other Topics Concern  . Not on file  Social History Narrative  . Not on file   Social Determinants of Health   Financial Resource Strain: Not on file  Food Insecurity: Not on file  Transportation Needs: Not on file  Physical Activity: Not on file  Stress: Not on file  Social Connections: Not on file  Intimate Partner Violence: Not on file     Current Outpatient Medications:  .  Calcium Carbonate-Vit D-Min (CALCIUM 1200 PO), Take 600 mg by mouth., Disp: , Rfl:  .  Cholecalciferol 25  MCG (1000 UT) tablet, Take 1,000 Units by mouth daily., Disp: , Rfl:  .  co-enzyme Q-10 30 MG capsule, Take 30 mg by mouth daily., Disp: , Rfl:  .  doxazosin (CARDURA) 4 MG tablet, Take 1 tablet (4 mg total) by mouth daily., Disp: 30 tablet, Rfl: 6 .  ferrous sulfate 325 (65 FE) MG tablet, Take 65 mg by mouth daily with breakfast. , Disp: , Rfl:  .  ketoconazole (NIZORAL) 2 % cream, Apply to the feet QHS, Disp: 60 g, Rfl: 3 .  loratadine (CLARITIN) 10 MG tablet, Take 1 tablet (10 mg total) by mouth daily., Disp: 30 tablet, Rfl: 11 .  lovastatin (MEVACOR) 20 MG tablet, Take 1 tablet (20 mg total) by mouth at bedtime., Disp: 90 tablet, Rfl: 3 .  metoprolol succinate (TOPROL-XL) 25 MG 24 hr  tablet, Take 1 tablet (25 mg total) by mouth daily., Disp: 90 tablet, Rfl: 3 .  Multiple Vitamins-Minerals (MULTIVITAMIN WITH MINERALS) tablet, Take 1 tablet by mouth daily., Disp: , Rfl:  .  Omega-3 Fatty Acids (FISH OIL) 500 MG CAPS, Take 500 mg by mouth., Disp: , Rfl:  .  silodosin (RAPAFLO) 8 MG CAPS capsule, Take 1 capsule (8 mg total) by mouth daily with breakfast., Disp: 30 capsule, Rfl: 1 .  TURMERIC CURCUMIN PO, Take 550 mg by mouth., Disp: , Rfl:  .  vitamin B-12 (CYANOCOBALAMIN) 1000 MCG tablet, Take 1 tablet (1,000 mcg total) by mouth daily., Disp: 90 tablet, Rfl: 1 .  vitamin C (ASCORBIC ACID) 500 MG tablet, Take 500 mg by mouth daily., Disp: , Rfl:    Allergies  Allergen Reactions  . Lisinopril Other (See Comments)    Face Swelling    ROS Review of Systems  Constitutional: Negative.   HENT: Negative.   Eyes: Negative.   Respiratory: Negative.   Cardiovascular: Negative.   Gastrointestinal: Negative.   Endocrine: Negative.   Genitourinary: Negative.   Musculoskeletal: Negative.   Skin: Negative.   Allergic/Immunologic: Negative.   Neurological: Negative.   Hematological: Negative.   Psychiatric/Behavioral: Negative.   All other systems reviewed and are negative.     Objective:  Cardiovascular examination revealed apical impulse to be palpable in the fifth intercostal space first heart sound is normal, second heart sounds normal, no murmur heard, no pericardial friction rub noted abdomen is soft nontender, Chest is clear on auscultation and percussion   BP 108/65   Pulse (!) 58   Ht 5\' 9"  (1.753 m)   Wt 146 lb 12.8 oz (66.6 kg)   BMI 21.68 kg/m  Wt Readings from Last 3 Encounters:  12/27/20 146 lb 12.8 oz (66.6 kg)  12/07/20 146 lb (66.2 kg)  11/25/20 146 lb (66.2 kg)     Health Maintenance Due  Topic Date Due  . TETANUS/TDAP  Never done  . PNA vac Low Risk Adult (1 of 2 - PCV13) Never done  . COVID-19 Vaccine (3 - Pfizer risk 4-dose series)  12/19/2019    There are no preventive care reminders to display for this patient.  Lab Results  Component Value Date   TSH 1.429 02/18/2020   Lab Results  Component Value Date   WBC 8.0 10/17/2020   HGB 12.9 (L) 10/17/2020   HCT 37.5 (L) 10/17/2020   MCV 96.9 10/17/2020   PLT 171 10/17/2020   Lab Results  Component Value Date   NA 138 10/17/2020   K 3.9 10/17/2020   CO2 25 10/17/2020   GLUCOSE 129 (H) 10/17/2020  BUN 24 (H) 10/17/2020   CREATININE 0.94 10/17/2020   BILITOT 1.9 (H) 10/17/2020   ALKPHOS 65 10/17/2020   AST 29 10/17/2020   ALT 33 10/17/2020   PROT 7.1 10/17/2020   ALBUMIN 4.3 10/17/2020   CALCIUM 9.3 10/17/2020   ANIONGAP 10 10/17/2020   No results found for: CHOL No results found for: HDL No results found for: LDLCALC No results found for: TRIG No results found for: CHOLHDL No results found for: HGBA1C    Assessment & Plan:   Problem List Items Addressed This Visit      Cardiovascular and Mediastinum   Essential hypertension    Patient blood pressure is normal patient denies any chest pain or shortness of breath there is no history of palpitation paroxysmal nocturnal dyspnea patient can walk 2 blocks without any problem patient was advised to follow low-salt low-cholesterol diet        Musculoskeletal and Integument   Sprain of right rotator cuff capsule - Primary    Patient states that his right shoulder is much better after he received a shot last visit.        Genitourinary   Prostate cancer ([place])     Other   Hyperlipidemia    - The patient's hyperlipidemia is stable on med. - The patient will continue the current treatment regimen.  - I encouraged the patient to eat more vegetables and whole wheat, and to avoid fatty foods like whole milk, hard cheese, egg yolks, margarine, baked sweets, and fried foods.  - I encouraged the patient to live an active lifestyle and complete activities for 40 minutes at least three times per week.  -  I instructed the patient to go to the ER if they begin having chest pain.       Thrombocytopenia ([place])    We will continue to follow the thrombocytopenia, patient sees oncology.      Normocytic anemia    Stable at the present time.         No orders of the defined types were placed in this encounter.   Follow-up: No follow-ups on file.    Cletis Athens, MD

## 2020-12-27 NOTE — Assessment & Plan Note (Signed)
Stable at the present time. 

## 2020-12-27 NOTE — Assessment & Plan Note (Signed)
We will continue to follow the thrombocytopenia, patient sees oncology.

## 2021-02-27 ENCOUNTER — Other Ambulatory Visit: Payer: Self-pay

## 2021-02-27 ENCOUNTER — Inpatient Hospital Stay: Payer: Medicare HMO | Attending: Oncology

## 2021-02-27 DIAGNOSIS — Z5111 Encounter for antineoplastic chemotherapy: Secondary | ICD-10-CM | POA: Insufficient documentation

## 2021-02-27 DIAGNOSIS — E538 Deficiency of other specified B group vitamins: Secondary | ICD-10-CM

## 2021-02-27 DIAGNOSIS — C61 Malignant neoplasm of prostate: Secondary | ICD-10-CM

## 2021-02-27 DIAGNOSIS — D649 Anemia, unspecified: Secondary | ICD-10-CM | POA: Diagnosis not present

## 2021-02-27 DIAGNOSIS — D696 Thrombocytopenia, unspecified: Secondary | ICD-10-CM

## 2021-02-27 LAB — CBC WITH DIFFERENTIAL/PLATELET
Abs Immature Granulocytes: 0.03 10*3/uL (ref 0.00–0.07)
Basophils Absolute: 0 10*3/uL (ref 0.0–0.1)
Basophils Relative: 0 %
Eosinophils Absolute: 0 10*3/uL (ref 0.0–0.5)
Eosinophils Relative: 0 %
HCT: 34.6 % — ABNORMAL LOW (ref 39.0–52.0)
Hemoglobin: 11.8 g/dL — ABNORMAL LOW (ref 13.0–17.0)
Immature Granulocytes: 0 %
Lymphocytes Relative: 14 %
Lymphs Abs: 1.1 10*3/uL (ref 0.7–4.0)
MCH: 33.4 pg (ref 26.0–34.0)
MCHC: 34.1 g/dL (ref 30.0–36.0)
MCV: 98 fL (ref 80.0–100.0)
Monocytes Absolute: 0.6 10*3/uL (ref 0.1–1.0)
Monocytes Relative: 9 %
Neutro Abs: 5.7 10*3/uL (ref 1.7–7.7)
Neutrophils Relative %: 77 %
Platelets: 183 10*3/uL (ref 150–400)
RBC: 3.53 MIL/uL — ABNORMAL LOW (ref 4.22–5.81)
RDW: 12.6 % (ref 11.5–15.5)
WBC: 7.4 10*3/uL (ref 4.0–10.5)
nRBC: 0 % (ref 0.0–0.2)

## 2021-02-27 LAB — COMPREHENSIVE METABOLIC PANEL
ALT: 17 U/L (ref 0–44)
AST: 21 U/L (ref 15–41)
Albumin: 3.7 g/dL (ref 3.5–5.0)
Alkaline Phosphatase: 65 U/L (ref 38–126)
Anion gap: 10 (ref 5–15)
BUN: 15 mg/dL (ref 8–23)
CO2: 25 mmol/L (ref 22–32)
Calcium: 8.7 mg/dL — ABNORMAL LOW (ref 8.9–10.3)
Chloride: 103 mmol/L (ref 98–111)
Creatinine, Ser: 0.95 mg/dL (ref 0.61–1.24)
GFR, Estimated: >60 mL/min (ref >60)
Glucose, Bld: 144 mg/dL — ABNORMAL HIGH (ref 70–99)
Potassium: 4.3 mmol/L (ref 3.5–5.1)
Sodium: 138 mmol/L (ref 135–145)
Total Bilirubin: 1.3 mg/dL — ABNORMAL HIGH (ref 0.3–1.2)
Total Protein: 6.8 g/dL (ref 6.5–8.1)

## 2021-02-27 LAB — VITAMIN B12: Vitamin B-12: 288 pg/mL (ref 180–914)

## 2021-02-28 ENCOUNTER — Inpatient Hospital Stay: Payer: Medicare HMO

## 2021-02-28 ENCOUNTER — Inpatient Hospital Stay: Payer: Medicare HMO | Admitting: Oncology

## 2021-02-28 ENCOUNTER — Encounter: Payer: Self-pay | Admitting: Oncology

## 2021-02-28 VITALS — BP 130/71 | HR 80 | Temp 97.4°F | Resp 16 | Wt 146.0 lb

## 2021-02-28 DIAGNOSIS — Z79818 Long term (current) use of other agents affecting estrogen receptors and estrogen levels: Secondary | ICD-10-CM | POA: Diagnosis not present

## 2021-02-28 DIAGNOSIS — D696 Thrombocytopenia, unspecified: Secondary | ICD-10-CM | POA: Diagnosis not present

## 2021-02-28 DIAGNOSIS — C61 Malignant neoplasm of prostate: Secondary | ICD-10-CM

## 2021-02-28 DIAGNOSIS — Z5111 Encounter for antineoplastic chemotherapy: Secondary | ICD-10-CM | POA: Diagnosis not present

## 2021-02-28 DIAGNOSIS — D649 Anemia, unspecified: Secondary | ICD-10-CM

## 2021-02-28 DIAGNOSIS — E538 Deficiency of other specified B group vitamins: Secondary | ICD-10-CM | POA: Diagnosis not present

## 2021-02-28 DIAGNOSIS — R911 Solitary pulmonary nodule: Secondary | ICD-10-CM | POA: Diagnosis not present

## 2021-02-28 LAB — PSA: Prostatic Specific Antigen: <0.01 ng/mL (ref 0.00–4.00)

## 2021-02-28 LAB — RETIC PANEL
Immature Retic Fract: 21.2 % — ABNORMAL HIGH (ref 2.3–15.9)
RBC.: 3.57 MIL/uL — ABNORMAL LOW (ref 4.22–5.81)
Retic Count, Absolute: 80.3 10*3/uL (ref 19.0–186.0)
Retic Ct Pct: 2.3 % (ref 0.4–3.1)
Reticulocyte Hemoglobin: 33.3 pg (ref >27.9)

## 2021-02-28 LAB — IRON AND TIBC
Iron: 71 ug/dL (ref 45–182)
Saturation Ratios: 27 % (ref 17.9–39.5)
TIBC: 260 ug/dL (ref 250–450)
UIBC: 189 ug/dL

## 2021-02-28 LAB — FERRITIN: Ferritin: 413 ng/mL — ABNORMAL HIGH (ref 24–336)

## 2021-02-28 LAB — FOLATE: Folate: >45 ng/mL (ref >5.9)

## 2021-02-28 MED ORDER — LEUPROLIDE ACETATE (4 MONTH) 30 MG ~~LOC~~ KIT
30.0000 mg | PACK | Freq: Once | SUBCUTANEOUS | Status: AC
Start: 2021-02-28 — End: 2021-02-28
  Administered 2021-02-28: 30 mg via SUBCUTANEOUS
  Filled 2021-02-28: qty 30

## 2021-02-28 NOTE — Progress Notes (Signed)
Hematology/Oncology follow up note [facility] [phone] Fax:([phone]   Patient Care Team: Mathew Athens, MD as PCP - General (Internal Medicine) Mathew Filbert, MD as Radiation Oncologist (Radiation Oncology)  REFERRING PROVIDER: Cletis Athens, MD  CHIEF COMPLAINTS/REASON FOR VISIT:  Follow-up for prostate cancer  HISTORY OF PRESENTING ILLNESS:   Mathew Martinez is a  87 y.o.  male with PMH listed below was seen in consultation at the request of  Mathew Athens, MD  for evaluation of prostate cancer Patient was seen by urologist Dr. Bernardo Martinez in February for evaluation of elevated PSA.  PSA drawn in November 2020 was elevated at 11.4.  No previous PSA results available.  Per patient, PSA typically fluctuates between 4 and 7 in the past. Also reports remote prostate biopsy and prostate surgery many years ago.  Patient denies any lower urinary tract symptoms.  A repeat PSA was drawn at Dr. Clydene Martinez office and that level increased to 15. 12/15/2019, patient underwent prostate biopsy Pathology report was scanned in epic. Left prostate biopsies showed benign pathology. Right base, right mid, right lateral base, right lateral mid, right lateral apex biopsies were positive for adenocarcinoma. 4 out of the 5 biopsies have Gleason score 7 (4+3), right apex has Gleason score 8 (4+4).  Patient was recommended by urologist to complete staging images.  Patient prefers to be referred to cancer center for counseling and management.  Today patient was accompanied by his wife.  He reports feeling well at baseline.  No urinary symptoms. Denies any fever, chills, unintentional weight loss, abdominal pain, bone pain. He is quite active at baseline.  He does push-up exercise.  -01/12/2020 CT chest abdomen pelvis  1.8 cm right lower lobe pulmonary nodule, metastatic lung disease versus primary lung cancer.  Otherwise no potential findings of metastatic disease in the  chest abdomen or pelvis.  Patient has three-vessel coronary office marked diffuse colonic diverticulosis.  Emphysema - PET Rectal normal lung nodule has very low FDG activity on PET scan, likely not metastatic prostate cancer. Low-grade primary lung cancer-ie, adenocarcinoma cannot be ruled out.  Patient has history of tobacco use   Patient has life expectancy> 5 years,I recommend radiation plus androgen deprivation therapy for 1-3 years.  Rationale and potential side effects of androgen deprivation therapy were discussed with patient.  #  2 doses of Firmagon with last dose of Firmagon on 02/18/2020. # switched to Eligard on 03/17/2020.  # For his 1.8 cm right lower lobe lung nodule, CT shows stable size.  Radonc recommend to continue observation rather than SBRT now. INTERVAL HISTORY Mathew Martinez is a 87 y.o. male who has above history reviewed by me today presents for follow up visit for management of prostate cancer Problems and complaints are listed below: He feels well today. He has had urinary incontinence which improves after taking Flomax, still has some voiding symptoms.    Review of Systems  Constitutional: Negative for appetite change, chills, fatigue, fever and unexpected weight change.  HENT:   Negative for hearing loss and voice change.   Eyes: Negative for eye problems and icterus.  Respiratory: Negative for chest tightness, cough and shortness of breath.   Cardiovascular: Negative for chest pain and leg swelling.  Gastrointestinal: Negative for abdominal distention and abdominal pain.  Endocrine: Negative for hot flashes.  Genitourinary: Negative for difficulty urinating, dysuria and frequency.   Musculoskeletal: Negative for arthralgias.  Skin: Negative for itching and rash.  Neurological: Negative for light-headedness and numbness.  Hematological: Negative for adenopathy. Does not bruise/bleed easily.  Psychiatric/Behavioral: Negative for confusion ( ).     MEDICAL HISTORY:  Past Medical History:  Diagnosis Date  . Arthritis    wrist  . Basal cell carcinoma 02/25/2013   Left mastoid area. Excised, margins free.  . Basal cell carcinoma 03/04/2013   Left posterior shoulder. Excised, margins free.  [name] BPH (benign prostatic hyperplasia)   . Dysplastic nevus 09/14/2013   Right upper back post. shoulder. Moderate to severe atypia, close to margin.  [name] Dysplastic nevus 09/14/2013   Right sup. popliteal. Moderate atypia, lateral and deep margins involved.  [name] Dysplastic nevus 09/14/2013   Right proximal med. calf. Moderate atypia, deep margin involved.   [name] Dysplastic nevus 09/14/2013   Right distal med. calf. Moderate atypia, lateral and deep margins involved.   . Hard of hearing   . Heart murmur   . History of basal cell carcinoma 02/18/2013   Right posterior wrist. Excised, margins free.  [name] History of melanoma 01/14/2013   left upper back  . Hyperlipemia   . Hypertension   . Prostate cancer ([place]) 12/29/2019    SURGICAL HISTORY: Past Surgical History:  Procedure Laterality Date  . APPENDECTOMY    . CATARACT EXTRACTION W/PHACO Right 03/19/2018   Procedure: CATARACT EXTRACTION PHACO AND INTRAOCULAR LENS PLACEMENT ([place]) COMPLICATED RIGHT;  Surgeon: Leandrew Koyanagi, MD;  Location: [place];  Service: Ophthalmology;  Laterality: Right;  [place]    . COLONOSCOPY WITH PROPOFOL N/A 07/15/2015   Procedure: COLONOSCOPY WITH PROPOFOL;  Surgeon: Josefine Class, MD;  Location: [facility] END[facility]COPY;  Service: Endoscopy;  Laterality: N/A;  . Duodenojejunostomy    . HERNIA REPAIR    . prostate tissue removal  1996    SOCIAL HISTORY: Social History   Socioeconomic History  . Marital status: Married    Spouse name: Not on file  . Number of children: Not on file  . Years of education: Not on file  . Highest education level: Not on file  Occupational History  . Occupation: retired  Tobacco Use  . Smoking  status: Former Smoker    Packs/day: 0.50    Years: 50.00    Pack years: 25.00    Quit date: 12/29/1999    Years since quitting: 21.1  . Smokeless tobacco: Never Used  Vaping Use  . Vaping Use: Never used  Substance and Sexual Activity  . Alcohol use: Never  . Drug use: Never  . Sexual activity: Not on file  Other Topics Concern  . Not on file  Social History Narrative  . Not on file   Social Determinants of Health   Financial Resource Strain: Not on file  Food Insecurity: Not on file  Transportation Needs: Not on file  Physical Activity: Not on file  Stress: Not on file  Social Connections: Not on file  Intimate Partner Violence: Not on file    FAMILY HISTORY: Family History  Problem Relation Age of Onset  . Heart disease Mother   . Diabetes Mother   . Cancer Father   . Prostate cancer Brother     ALLERGIES:  is allergic to lisinopril.  MEDICATIONS:  Current Outpatient Medications  Medication Sig Dispense Refill  . Calcium Carbonate-Vit D-Min (CALCIUM 1200 PO) Take 600 mg by mouth.    . Cholecalciferol 25 MCG (1000 UT) tablet Take 1,000 Units by mouth daily.    [name] co-enzyme Q-10 30 MG capsule Take 30 mg by mouth daily.    [name]  doxazosin (CARDURA) 4 MG tablet Take 1 tablet (4 mg total) by mouth daily. 30 tablet 6  . ferrous sulfate 325 (65 FE) MG tablet Take 65 mg by mouth daily with breakfast.     . ketoconazole (NIZORAL) 2 % cream Apply to the feet QHS 60 g 3  . loratadine (CLARITIN) 10 MG tablet Take 1 tablet (10 mg total) by mouth daily. 30 tablet 11  . lovastatin (MEVACOR) 20 MG tablet Take 1 tablet (20 mg total) by mouth at bedtime. 90 tablet 3  . metoprolol succinate (TOPROL-XL) 25 MG 24 hr tablet Take 1 tablet (25 mg total) by mouth daily. 90 tablet 3  . Multiple Vitamins-Minerals (MULTIVITAMIN WITH MINERALS) tablet Take 1 tablet by mouth daily.    . Omega-3 Fatty Acids (FISH OIL) 500 MG CAPS Take 500 mg by mouth.    . TURMERIC CURCUMIN PO Take 550 mg by mouth.     . vitamin C (ASCORBIC ACID) 500 MG tablet Take 500 mg by mouth daily.    . silodosin (RAPAFLO) 8 MG CAPS capsule Take 1 capsule (8 mg total) by mouth daily with breakfast. (Patient not taking: Reported on 02/28/2021) 30 capsule 1  . vitamin B-12 (CYANOCOBALAMIN) 1000 MCG tablet Take 1 tablet (1,000 mcg total) by mouth daily. (Patient not taking: Reported on 02/28/2021) 90 tablet 1   No current facility-administered medications for this visit.     PHYSICAL EXAMINATION: ECOG PERFORMANCE STATUS: 0 - Asymptomatic Vitals:   02/28/21 1004  BP: 130/71  Pulse: 80  Resp: 16  Temp: (!) 97.4 F (36.3 C)   Filed Weights   02/28/21 1004  Weight: 146 lb (66.2 kg)    Physical Exam Constitutional:      General: He is not in acute distress. HENT:     Head: Normocephalic and atraumatic.  Eyes:     General: No scleral icterus. Cardiovascular:     Rate and Rhythm: Normal rate and regular rhythm.     Heart sounds: Normal heart sounds.  Pulmonary:     Effort: Pulmonary effort is normal. No respiratory distress.     Breath sounds: No wheezing.  Abdominal:     General: Bowel sounds are normal. There is no distension.     Palpations: Abdomen is soft.  Musculoskeletal:        General: No deformity. Normal range of motion.     Cervical back: Normal range of motion and neck supple.  Skin:    General: Skin is warm and dry.     Findings: No erythema or rash.  Neurological:     Mental Status: He is alert and oriented to person, place, and time. Mental status is at baseline.     Cranial Nerves: No cranial nerve deficit.     Coordination: Coordination normal.  Psychiatric:        Mood and Affect: Mood normal.       LABORATORY DATA:  I have reviewed the data as listed Lab Results  Component Value Date   WBC 7.4 02/27/2021   HGB 11.8 (L) 02/27/2021   HCT 34.6 (L) 02/27/2021   MCV 98.0 02/27/2021   PLT 183 02/27/2021   Recent Labs    04/27/20 1004 07/19/20 0936 10/17/20 0924  02/27/21 1057  NA 137 138 138 138  K 4.7 4.1 3.9 4.3  CL 101 103 103 103  CO2 27 26 25 25   GLUCOSE 125* 156* 129* 144*  BUN 25* 15 24* 15  CREATININE 1.13 1.00 0.94 0.95  CALCIUM 9.0 8.7* 9.3 8.7*  GFRNONAA 58* >60 >60 >60  GFRAA >60 >60  --   --   PROT 7.1 6.5 7.1 6.8  ALBUMIN 4.1 3.9 4.3 3.7  AST 31 27 29 21   ALT 36 28 33 17  ALKPHOS 69 58 65 65  BILITOT 1.4* 1.2 1.9* 1.3*   Iron/TIBC/Ferritin/ %Sat    Component Value Date/Time   IRON 144 10/17/2020 0924   TIBC 333 10/17/2020 0924   FERRITIN 386 (H) 10/17/2020 0924   IRONPCTSAT 41 (H) 10/17/2020 [id number]      RADIOGRAPHIC STUDIES: I have personally reviewed the radiological images as listed and agreed with the findings in the report. No results found.    ASSESSMENT & PLAN:  1. Prostate cancer ([place])   2. Normocytic anemia   3. Thrombocytopenia ([place])   4. B12 deficiency   5. Androgen deprivation therapy   6. Lung nodule   T2bN0M0 prostate cancer was discussed. #Risk stratification Grade group 4/Gleason 8 (4+4), PSA 15,  high risk group.   Finished radiation iron recommend continue androgen deprivation therapy for 1 to 3 years.  Continue Eligard and proceed with 30mg  today Check PSA  #Lung nodule, cannot rule out underlying malignancy.   05/04/2020, CT chest with contrast showed interval stability of the 1.8 cm pulmonary nodule.  Continue surveillance.  Repeat CT scan in 12 to 24 months.- repeat CT chest without contrast in July 2022.  #Chronic anemia, normocytic.  Check iron panel, folate level.retic panel.   #Vitamin B12 deficiency, B12 level has improved.  I recommend patient continue over-the-counter B12 1000 MCG daily.  Orders Placed This Encounter  Procedures  . CT CHEST WO CONTRAST    Standing Status:   Future    Standing Expiration Date:   02/28/2022    Order Specific Question:   Preferred imaging location?    Answer:   [facility]    Order Specific Question:   Radiology Contrast Protocol - do NOT  remove file path    Answer:   [email or web address]  . Iron and TIBC    Standing Status:   Future    Number of Occurrences:   1    Standing Expiration Date:   02/28/2022  . Ferritin    Standing Status:   Future    Number of Occurrences:   1    Standing Expiration Date:   02/28/2022  . Retic Panel    Standing Status:   Future    Number of Occurrences:   1    Standing Expiration Date:   02/28/2022  . PSA    Standing Status:   Future    Number of Occurrences:   1    Standing Expiration Date:   02/28/2022  . Folate    Standing Status:   Future    Number of Occurrences:   1    Standing Expiration Date:   02/28/2022  . CBC with Differential/Platelet    Standing Status:   Future    Standing Expiration Date:   02/28/2022  . Comprehensive metabolic panel    Standing Status:   Future    Standing Expiration Date:   02/28/2022  . PSA    Standing Status:   Future    Standing Expiration Date:   02/28/2022    All questions were answered. The patient knows to call the clinic with any problems questions or concerns.   Return of visit:  4 months.  Earlie Server, MD, PhD Hematology Oncology [facility] [place]- [phone] 02/28/2021

## 2021-02-28 NOTE — Progress Notes (Signed)
Patient has urinary incontinence and is followed by urology.

## 2021-03-03 LAB — HEMOCHROMATOSIS DNA-PCR(C282Y,H63D)

## 2021-03-29 ENCOUNTER — Ambulatory Visit (INDEPENDENT_AMBULATORY_CARE_PROVIDER_SITE_OTHER): Payer: Medicare HMO | Admitting: Internal Medicine

## 2021-03-29 ENCOUNTER — Other Ambulatory Visit: Payer: Self-pay

## 2021-03-29 VITALS — BP 118/63 | HR 87 | Ht 69.0 in | Wt 145.2 lb

## 2021-03-29 DIAGNOSIS — I10 Essential (primary) hypertension: Secondary | ICD-10-CM | POA: Diagnosis not present

## 2021-03-29 DIAGNOSIS — S43421A Sprain of right rotator cuff capsule, initial encounter: Secondary | ICD-10-CM

## 2021-03-29 DIAGNOSIS — J301 Allergic rhinitis due to pollen: Secondary | ICD-10-CM

## 2021-03-29 DIAGNOSIS — E7800 Pure hypercholesterolemia, unspecified: Secondary | ICD-10-CM

## 2021-03-29 DIAGNOSIS — C61 Malignant neoplasm of prostate: Secondary | ICD-10-CM | POA: Diagnosis not present

## 2021-03-31 ENCOUNTER — Encounter: Payer: Self-pay | Admitting: Internal Medicine

## 2021-03-31 NOTE — Assessment & Plan Note (Signed)
Take Claritin as needed 5 mg daily

## 2021-03-31 NOTE — Assessment & Plan Note (Signed)
Hypercholesterolemia  I advised the patient to follow Mediterranean diet This diet is rich in fruits vegetables and whole grain, and This diet is also rich in fish and lean meat Patient should also eat a handful of almonds or walnuts daily Recent heart study indicated that average follow-up on this kind of diet reduces the cardiovascular mortality by 50 to 70%== 

## 2021-03-31 NOTE — Assessment & Plan Note (Signed)
Symptoms are under control

## 2021-03-31 NOTE — Progress Notes (Signed)
Established Patient Office Visit  Subjective:  Patient ID: Mathew Martinez, male    DOB: 1930/11/01  Age: 87 y.o. MRN: [id number]  CC:  Chief Complaint  Patient presents with   Hypertension    3 month follow up    Hypertension This is a chronic problem. The current episode started more than 1 year ago. The problem is controlled. Associated symptoms include anxiety. Pertinent negatives include no chest pain, orthopnea or shortness of breath. There is no history of angina.   Mathew Martinez presents for  check up  Past Medical History:  Diagnosis Date   Arthritis    wrist   Basal cell carcinoma 02/25/2013   Left mastoid area. Excised, margins free.   Basal cell carcinoma 03/04/2013   Left posterior shoulder. Excised, margins free.   BPH (benign prostatic hyperplasia)    Dysplastic nevus 09/14/2013   Right upper back post. shoulder. Moderate to severe atypia, close to margin.   Dysplastic nevus 09/14/2013   Right sup. popliteal. Moderate atypia, lateral and deep margins involved.   Dysplastic nevus 09/14/2013   Right proximal med. calf. Moderate atypia, deep margin involved.    Dysplastic nevus 09/14/2013   Right distal med. calf. Moderate atypia, lateral and deep margins involved.    Hard of hearing    Heart murmur    History of basal cell carcinoma 02/18/2013   Right posterior wrist. Excised, margins free.   History of melanoma 01/14/2013   left upper back   Hyperlipemia    Hypertension    Prostate cancer ([place]) 12/29/2019    Past Surgical History:  Procedure Laterality Date   APPENDECTOMY     CATARACT EXTRACTION W/PHACO Right 03/19/2018   Procedure: CATARACT EXTRACTION PHACO AND INTRAOCULAR LENS PLACEMENT ([place]) COMPLICATED RIGHT;  Surgeon: Leandrew Koyanagi, MD;  Location: [facility];  Service: Ophthalmology;  Laterality: Right;  MALYUGIN   CHOLECYSTECTOMY     COLONOSCOPY WITH PROPOFOL N/A 07/15/2015   Procedure: COLONOSCOPY WITH PROPOFOL;  Surgeon:  Josefine Class, MD;  Location: [facility] ENDOSCOPY;  Service: Endoscopy;  Laterality: N/A;   Duodenojejunostomy     HERNIA REPAIR     prostate tissue removal  1996    Family History  Problem Relation Age of Onset   Heart disease Mother    Diabetes Mother    Cancer Father    Prostate cancer Brother     Social History   Socioeconomic History   Marital status: Married    Spouse name: Not on file   Number of children: Not on file   Years of education: Not on file   Highest education level: Not on file  Occupational History   Occupation: retired  Tobacco Use   Smoking status: Former    Packs/day: 0.50    Years: 50.00    Pack years: 25.00    Types: Cigarettes    Quit date: 12/29/1999    Years since quitting: 21.2   Smokeless tobacco: Never  Vaping Use   Vaping Use: Never used  Substance and Sexual Activity   Alcohol use: Never   Drug use: Never   Sexual activity: Not on file  Other Topics Concern   Not on file  Social History Narrative   Not on file   Social Determinants of Health   Financial Resource Strain: Not on file  Food Insecurity: Not on file  Transportation Needs: Not on file  Physical Activity: Not on file  Stress: Not on file  Social Connections: Not on  file  Intimate Partner Violence: Not on file     Current Outpatient Medications:    Calcium Carbonate-Vit D-Min (CALCIUM 1200 PO), Take 600 mg by mouth., Disp: , Rfl:    Cholecalciferol 25 MCG (1000 UT) tablet, Take 1,000 Units by mouth daily., Disp: , Rfl:    co-enzyme Q-10 30 MG capsule, Take 30 mg by mouth daily., Disp: , Rfl:    doxazosin (CARDURA) 4 MG tablet, Take 1 tablet (4 mg total) by mouth daily., Disp: 30 tablet, Rfl: 6   ferrous sulfate 325 (65 FE) MG tablet, Take 65 mg by mouth daily with breakfast. , Disp: , Rfl:    ketoconazole (NIZORAL) 2 % cream, Apply to the feet QHS, Disp: 60 g, Rfl: 3   loratadine (CLARITIN) 10 MG tablet, Take 1 tablet (10 mg total) by mouth daily., Disp: 30  tablet, Rfl: 11   lovastatin (MEVACOR) 20 MG tablet, Take 1 tablet (20 mg total) by mouth at bedtime., Disp: 90 tablet, Rfl: 3   metoprolol succinate (TOPROL-XL) 25 MG 24 hr tablet, Take 1 tablet (25 mg total) by mouth daily., Disp: 90 tablet, Rfl: 3   Multiple Vitamins-Minerals (MULTIVITAMIN WITH MINERALS) tablet, Take 1 tablet by mouth daily., Disp: , Rfl:    Omega-3 Fatty Acids (FISH OIL) 500 MG CAPS, Take 500 mg by mouth., Disp: , Rfl:    TURMERIC CURCUMIN PO, Take 550 mg by mouth., Disp: , Rfl:    vitamin B-12 (CYANOCOBALAMIN) 1000 MCG tablet, Take 1 tablet (1,000 mcg total) by mouth daily. (Patient not taking: Reported on 02/28/2021), Disp: 90 tablet, Rfl: 1   vitamin C (ASCORBIC ACID) 500 MG tablet, Take 500 mg by mouth daily., Disp: , Rfl:    Allergies  Allergen Reactions   Lisinopril Other (See Comments)    Face Swelling    ROS Review of Systems  Constitutional: Negative.   HENT: Negative.    Eyes: Negative.   Respiratory: Negative.  Negative for shortness of breath.   Cardiovascular: Negative.  Negative for chest pain and orthopnea.  Gastrointestinal: Negative.   Endocrine: Negative.   Genitourinary: Negative.   Musculoskeletal: Negative.   Skin: Negative.   Allergic/Immunologic: Negative.   Neurological: Negative.   Hematological: Negative.   Psychiatric/Behavioral: Negative.    All other systems reviewed and are negative.    Objective:    Physical Exam Vitals reviewed.  Constitutional:      Appearance: Normal appearance.  HENT:     Mouth/Throat:     Mouth: Mucous membranes are moist.  Eyes:     Pupils: Pupils are equal, round, and reactive to light.  Neck:     Vascular: No carotid bruit.  Cardiovascular:     Rate and Rhythm: Normal rate and regular rhythm.     Pulses: Normal pulses.     Heart sounds: Normal heart sounds.  Pulmonary:     Effort: Pulmonary effort is normal.     Breath sounds: Normal breath sounds.  Abdominal:     General: Bowel sounds  are normal.     Palpations: Abdomen is soft. There is no hepatomegaly, splenomegaly or mass.     Tenderness: There is no abdominal tenderness.     Hernia: No hernia is present.  Musculoskeletal:     Cervical back: Neck supple.     Right lower leg: No edema.     Left lower leg: No edema.  Skin:    Findings: No rash.  Neurological:     Mental Status: He is  alert and oriented to person, place, and time.     Motor: No weakness.  Psychiatric:        Mood and Affect: Mood normal.        Behavior: Behavior normal.    BP 118/63   Pulse 87   Ht 5\' 9"  (1.753 m)   Wt 145 lb 3.2 oz (65.9 kg)   BMI 21.44 kg/m  Wt Readings from Last 3 Encounters:  03/29/21 145 lb 3.2 oz (65.9 kg)  02/28/21 146 lb (66.2 kg)  12/27/20 146 lb 12.8 oz (66.6 kg)     Health Maintenance Due  Topic Date Due   TETANUS/TDAP  Never done   Zoster Vaccines- Shingrix (1 of 2) Never done   PNA vac Low Risk Adult (1 of 2 - PCV13) Never done   COVID-19 Vaccine (3 - Pfizer risk series) 12/19/2019    There are no preventive care reminders to display for this patient.  Lab Results  Component Value Date   TSH 1.429 02/18/2020   Lab Results  Component Value Date   WBC 7.4 02/27/2021   HGB 11.8 (L) 02/27/2021   HCT 34.6 (L) 02/27/2021   MCV 98.0 02/27/2021   PLT 183 02/27/2021   Lab Results  Component Value Date   NA 138 02/27/2021   K 4.3 02/27/2021   CO2 25 02/27/2021   GLUCOSE 144 (H) 02/27/2021   BUN 15 02/27/2021   CREATININE 0.95 02/27/2021   BILITOT 1.3 (H) 02/27/2021   ALKPHOS 65 02/27/2021   AST 21 02/27/2021   ALT 17 02/27/2021   PROT 6.8 02/27/2021   ALBUMIN 3.7 02/27/2021   CALCIUM 8.7 (L) 02/27/2021   ANIONGAP 10 02/27/2021   No results found for: CHOL No results found for: HDL No results found for: LDLCALC No results found for: TRIG No results found for: CHOLHDL No results found for: HGBA1C    Assessment & Plan:   Problem List Items Addressed This Visit       Cardiovascular  and Mediastinum   Essential hypertension - Primary     Stable             Respiratory   Seasonal allergic rhinitis due to pollen    Take Claritin as needed 5 mg daily         Musculoskeletal and Integument   Sprain of right rotator cuff capsule     Genitourinary   Prostate cancer ([place])    Symptoms are under control         Other   Hyperlipidemia    Hypercholesterolemia  I advised the patient to follow Mediterranean diet This diet is rich in fruits vegetables and whole grain, and This diet is also rich in fish and lean meat Patient should also eat a handful of almonds or walnuts daily Recent heart study indicated that average follow-up on this kind of diet reduces the cardiovascular mortality by 50 to 70%==        No orders of the defined types were placed in this encounter.   Follow-up: No follow-ups on file.    Cletis Athens, MD

## 2021-03-31 NOTE — Assessment & Plan Note (Signed)
Stable

## 2021-04-17 ENCOUNTER — Other Ambulatory Visit: Payer: Self-pay | Admitting: Internal Medicine

## 2021-05-05 ENCOUNTER — Other Ambulatory Visit: Payer: Self-pay

## 2021-05-05 ENCOUNTER — Ambulatory Visit
Admission: RE | Admit: 2021-05-05 | Discharge: 2021-05-05 | Disposition: A | Discharge status: Home / Self Care | Payer: Medicare HMO | Source: Ambulatory Visit | Attending: Oncology | Admitting: Oncology

## 2021-05-05 DIAGNOSIS — R911 Solitary pulmonary nodule: Secondary | ICD-10-CM | POA: Insufficient documentation

## 2021-05-05 DIAGNOSIS — I700 Atherosclerosis of aorta: Secondary | ICD-10-CM | POA: Diagnosis not present

## 2021-05-05 DIAGNOSIS — J439 Emphysema, unspecified: Secondary | ICD-10-CM | POA: Diagnosis not present

## 2021-05-05 DIAGNOSIS — C61 Malignant neoplasm of prostate: Secondary | ICD-10-CM | POA: Insufficient documentation

## 2021-05-15 ENCOUNTER — Other Ambulatory Visit: Payer: Self-pay | Admitting: Internal Medicine

## 2021-05-15 ENCOUNTER — Other Ambulatory Visit: Payer: Self-pay | Admitting: *Deleted

## 2021-05-15 MED ORDER — LOVASTATIN 20 MG PO TABS: 20.0000 mg | ORAL_TABLET | Freq: Every day | ORAL | 3 refills | Status: DC

## 2021-05-24 ENCOUNTER — Ambulatory Visit: Payer: Medicare HMO | Admitting: Urology

## 2021-05-24 ENCOUNTER — Encounter: Payer: Self-pay | Admitting: Urology

## 2021-05-24 ENCOUNTER — Other Ambulatory Visit: Payer: Self-pay

## 2021-05-24 VITALS — BP 123/70 | HR 4 | Ht 69.0 in | Wt 145.0 lb

## 2021-05-24 DIAGNOSIS — R32 Unspecified urinary incontinence: Secondary | ICD-10-CM | POA: Diagnosis not present

## 2021-05-24 DIAGNOSIS — R399 Unspecified symptoms and signs involving the genitourinary system: Secondary | ICD-10-CM | POA: Diagnosis not present

## 2021-05-24 LAB — BLADDER SCAN AMB NON-IMAGING: SCA Result: 0

## 2021-05-24 MED ORDER — MIRABEGRON ER 25 MG PO TB24
25.0000 mg | ORAL_TABLET | Freq: Every day | ORAL | 0 refills | Status: DC
Start: 2021-05-24 — End: 2021-06-14

## 2021-05-24 NOTE — Progress Notes (Signed)
05/24/2021 2:31 PM   Mathew Martinez 1931/05/26 [id number]  Referring provider: Cletis Athens, MD [address] [place],  [place] [address]  Chief Complaint  Patient presents with   Other    Urologic history: 1.  Prostate cancer Biopsy 11/2019; PSA 15.1; 34 g prostate Pathology 5/6 right cores positive Gleason 4+3 adenocarcinoma; 6th core positive Gleason 4+4.  All left-sided cores benign IMRT + ADT; IMRT completed 03/2020  2.  Lower urinary tract symptoms   HPI: 86 y.o. male presents for follow-up.  Seen in January 2022 for lower urinary tract symptoms Started on silodosin and noted improvement in his urinary stream and obstructive symptoms He only took the medication for 2 months then discontinued it however the improvement in his voiding symptoms lasted His only complaint has been intermittent urinary incontinence He will note intermittent leakage throughout the day; no significant urge but no stress incontinence Denies dysuria, gross hematuria   PMH: Past Medical History:  Diagnosis Date   Arthritis    wrist   Basal cell carcinoma 02/25/2013   Left mastoid area. Excised, margins free.   Basal cell carcinoma 03/04/2013   Left posterior shoulder. Excised, margins free.   BPH (benign prostatic hyperplasia)    Dysplastic nevus 09/14/2013   Right upper back post. shoulder. Moderate to severe atypia, close to margin.   Dysplastic nevus 09/14/2013   Right sup. popliteal. Moderate atypia, lateral and deep margins involved.   Dysplastic nevus 09/14/2013   Right proximal med. calf. Moderate atypia, deep margin involved.    Dysplastic nevus 09/14/2013   Right distal med. calf. Moderate atypia, lateral and deep margins involved.    Hard of hearing    Heart murmur    History of basal cell carcinoma 02/18/2013   Right posterior wrist. Excised, margins free.   History of melanoma 01/14/2013   left upper back   Hyperlipemia    Hypertension    Prostate cancer ([place])  12/29/2019    Surgical History: Past Surgical History:  Procedure Laterality Date   APPENDECTOMY     CATARACT EXTRACTION W/PHACO Right 03/19/2018   Procedure: CATARACT EXTRACTION PHACO AND INTRAOCULAR LENS PLACEMENT ([place]) COMPLICATED RIGHT;  Surgeon: Leandrew Koyanagi, MD;  Location: [facility];  Service: Ophthalmology;  Laterality: Right;  MALYUGIN   CHOLECYSTECTOMY     COLONOSCOPY WITH PROPOFOL N/A 07/15/2015   Procedure: COLONOSCOPY WITH PROPOFOL;  Surgeon: Josefine Class, MD;  Location[facility] ENDOSCOPY;  Service: Endoscopy;  Laterality: N/A;   Duodenojejunostomy     HERNIA REPAIR     prostate tissue removal  1996    Home Medications:  Allergies as of 05/24/2021       Reactions   Lisinopril Other (See Comments)   Face Swelling        Medication List        Accurate as of May 24, 2021  2:31 PM. If you have any questions, ask your nurse or doctor.          CALCIUM 1200 PO Take 600 mg by mouth.   Cholecalciferol 25 MCG (1000 UT) tablet Take 1,000 Units by mouth daily.   co-enzyme Q-10 30 MG capsule Take 30 mg by mouth daily.   doxazosin 4 MG tablet Commonly known as: Cardura Take 1 tablet (4 mg total) by mouth daily.   ferrous sulfate 325 (65 FE) MG tablet Take 65 mg by mouth daily with breakfast.   Fish Oil 500 MG Caps Take 500 mg by mouth.   ketoconazole 2 %  cream Commonly known as: NIZORAL Apply to the feet QHS   loratadine 10 MG tablet Commonly known as: CLARITIN Take 1 tablet (10 mg total) by mouth daily.   lovastatin 20 MG tablet Commonly known as: MEVACOR Take 1 tablet (20 mg total) by mouth at bedtime.   metoprolol succinate 25 MG 24 hr tablet Commonly known as: TOPROL-XL Take 1 tablet (25 mg total) by mouth daily.   multivitamin with minerals tablet Take 1 tablet by mouth daily.   TURMERIC CURCUMIN PO Take 550 mg by mouth.   vitamin B-12 1000 MCG tablet Commonly known as: CYANOCOBALAMIN Take 1 tablet (1,000 mcg  total) by mouth daily.   vitamin C 500 MG tablet Commonly known as: ASCORBIC ACID Take 500 mg by mouth daily.        Allergies:  Allergies  Allergen Reactions   Lisinopril Other (See Comments)    Face Swelling    Family History: Family History  Problem Relation Age of Onset   Heart disease Mother    Diabetes Mother    Cancer Father    Prostate cancer Brother     Social History:  reports that he quit smoking about 21 years ago. His smoking use included cigarettes. He has a 25.00 pack-year smoking history. He has never used smokeless tobacco. He reports that he does not drink alcohol and does not use drugs.   Physical Exam: BP 123/70   Pulse (!) 4   Ht '5\' 9"'$  (1.753 m)   Wt 145 lb (65.8 kg)   BMI 21.41 kg/m   Constitutional:  Alert and oriented, No acute distress. HEENT: [place] AT, moist mucus membranes.  Trachea midline, no masses. Cardiovascular: No clubbing, cyanosis, or edema. Respiratory: Normal respiratory effort, no increased work of breathing.   Assessment & Plan:    1. Lower urinary tract symptoms (LUTS) Obstructive symptoms improved Bladder scan PVR 0 mL  2.  Urinary incontinence, unspecified Trial Myrbetriq 25 mg daily-samples given x4 weeks Will call back 1 month regarding efficacy   Abbie Sons, MD  [facility] [address] [place], [place] [address] [phone]

## 2021-05-25 ENCOUNTER — Encounter: Payer: Self-pay | Admitting: Urology

## 2021-06-08 ENCOUNTER — Ambulatory Visit: Payer: Self-pay | Admitting: Urology

## 2021-06-08 ENCOUNTER — Ambulatory Visit
Admission: RE | Admit: 2021-06-08 | Discharge: 2021-06-08 | Disposition: A | Discharge status: Home / Self Care | Payer: Medicare HMO | Source: Ambulatory Visit | Attending: Radiation Oncology | Admitting: Radiation Oncology

## 2021-06-08 ENCOUNTER — Encounter: Payer: Self-pay | Admitting: Radiation Oncology

## 2021-06-08 DIAGNOSIS — C61 Malignant neoplasm of prostate: Secondary | ICD-10-CM

## 2021-06-08 DIAGNOSIS — Z923 Personal history of irradiation: Secondary | ICD-10-CM | POA: Insufficient documentation

## 2021-06-08 DIAGNOSIS — Z08 Encounter for follow-up examination after completed treatment for malignant neoplasm: Secondary | ICD-10-CM | POA: Diagnosis not present

## 2021-06-08 DIAGNOSIS — R911 Solitary pulmonary nodule: Secondary | ICD-10-CM | POA: Diagnosis not present

## 2021-06-08 NOTE — Progress Notes (Signed)
Radiation Oncology Follow up Note  Name: Mathew Martinez   Date:   06/08/2021 MRN:  [id number] DOB: 12-Oct-1931    This 87 y.o. male presents to the clinic today for 34-monthfollow-up status post IMRT radiation therapy to his prostate and pelvic nodes for Gleason 8 (4+4) adenocarcinoma presenting with a PSA of 11.4.  REFERRING PROVIDER: MCletis Athens MD  HPI: Patient is an 87year old male now about 14 months having completed IMRT radiation therapy to his prostate and pelvic nodes for Gleason 8 adenocarcinoma seen today in routine follow-up he is doing well specifically denies diarrhea or fatigue..[name] His last PSA in May was less than 0.01 patient had a repeat CT scan last month showing a 1.8 x 1.2 mm rounded nodule right lower lobe remaining unchanged favoring benign.  However there was a 6 x 9 mm irregular nodule posterior right upper lobe with surrounding roundglass opacity.  This area is being followed by Dr. YTasia Catchingsand recommendation for 325-monthollow-up was made.  He is asymptomatic from a pulmonary standpoint.  Patient has been having some elevated voiding symptoms for which she is currently on Myrbetriq.  COMPLICATIONS OF TREATMENT: none  FOLLOW UP COMPLIANCE: keeps appointments   PHYSICAL EXAM:  BP (P) 116/65 (BP Location: Left Arm, Patient Position: Sitting)   Pulse (P) 73   Resp (P) 16   Wt (P) 147 lb 11.2 oz (67 kg)   BMI (P) 21.81 kg/m  Well-developed well-nourished patient in NAD. HEENT reveals PERLA, EOMI, discs not visualized.  Oral cavity is clear. No oral mucosal lesions are identified. Neck is clear without evidence of cervical or supraclavicular adenopathy. Lungs are clear to A&P. Cardiac examination is essentially unremarkable with regular rate and rhythm without murmur rub or thrill. Abdomen is benign with no organomegaly or masses noted. Motor sensory and DTR levels are equal and symmetric in the upper and lower extremities. Cranial nerves II through XII are grossly intact.  Proprioception is intact. No peripheral adenopathy or edema is identified. No motor or sensory levels are noted. Crude visual fields are within normal range.  RADIOLOGY RESULTS: CT scan of the chest reviewed compatible with above-stated findings  PLAN: At the present time I like to follow him up after 3 months after his next CT scan.  If there is progression in the area of the right upper lobe may offer SBRT at that time we have discussed that briefly.  3-48-monthllow-up after CT scan of the chest was scheduled.  I would like to take this opportunity to thank you for allowing me to participate in the care of your patient..  Noreene FilbertD

## 2021-06-14 ENCOUNTER — Other Ambulatory Visit: Payer: Self-pay | Admitting: *Deleted

## 2021-06-14 MED ORDER — MIRABEGRON ER 25 MG PO TB24: 25.0000 mg | ORAL_TABLET | Freq: Every day | ORAL | 3 refills | Status: DC

## 2021-06-23 ENCOUNTER — Telehealth: Payer: Self-pay | Admitting: Urology

## 2021-06-23 NOTE — Telephone Encounter (Signed)
Patient states he is taking Mybetriq 25 and has complaints of Diarrhea. He has been on this medication for 2 months and the symptoms started 2 days ago. He states he is also on radiation treatments. I informed him that I do not think it is this medication. I asked if he could stop the medication until the symptoms resolve and to try the medication again. If the diarrhea returns call our office. Patient voiced understanding.

## 2021-06-23 NOTE — Telephone Encounter (Signed)
Patient called the office to report a medication reaction.   I placed the patient on hold to gather more information and he hung up.  Please call the patient to obtain more information.

## 2021-06-29 ENCOUNTER — Inpatient Hospital Stay: Payer: Medicare HMO | Attending: Radiation Oncology

## 2021-06-29 DIAGNOSIS — E538 Deficiency of other specified B group vitamins: Secondary | ICD-10-CM

## 2021-06-29 DIAGNOSIS — Z5111 Encounter for antineoplastic chemotherapy: Secondary | ICD-10-CM | POA: Insufficient documentation

## 2021-06-29 DIAGNOSIS — D696 Thrombocytopenia, unspecified: Secondary | ICD-10-CM

## 2021-06-29 DIAGNOSIS — C61 Malignant neoplasm of prostate: Secondary | ICD-10-CM | POA: Diagnosis not present

## 2021-06-29 DIAGNOSIS — D649 Anemia, unspecified: Secondary | ICD-10-CM

## 2021-06-29 LAB — COMPREHENSIVE METABOLIC PANEL
ALT: 24 U/L (ref 0–44)
AST: 28 U/L (ref 15–41)
Albumin: 3.7 g/dL (ref 3.5–5.0)
Alkaline Phosphatase: 64 U/L (ref 38–126)
Anion gap: 8 (ref 5–15)
BUN: 11 mg/dL (ref 8–23)
CO2: 25 mmol/L (ref 22–32)
Calcium: 8.5 mg/dL — ABNORMAL LOW (ref 8.9–10.3)
Chloride: 106 mmol/L (ref 98–111)
Creatinine, Ser: 0.87 mg/dL (ref 0.61–1.24)
GFR, Estimated: >60 mL/min (ref >60)
Glucose, Bld: 143 mg/dL — ABNORMAL HIGH (ref 70–99)
Potassium: 4.2 mmol/L (ref 3.5–5.1)
Sodium: 139 mmol/L (ref 135–145)
Total Bilirubin: 1.2 mg/dL (ref 0.3–1.2)
Total Protein: 6.7 g/dL (ref 6.5–8.1)

## 2021-06-29 LAB — CBC WITH DIFFERENTIAL/PLATELET
Abs Immature Granulocytes: 0.05 10*3/uL (ref 0.00–0.07)
Basophils Absolute: 0 10*3/uL (ref 0.0–0.1)
Basophils Relative: 1 %
Eosinophils Absolute: 0.1 10*3/uL (ref 0.0–0.5)
Eosinophils Relative: 2 %
HCT: 33.5 % — ABNORMAL LOW (ref 39.0–52.0)
Hemoglobin: 11.3 g/dL — ABNORMAL LOW (ref 13.0–17.0)
Immature Granulocytes: 1 %
Lymphocytes Relative: 18 %
Lymphs Abs: 1 10*3/uL (ref 0.7–4.0)
MCH: 32.3 pg (ref 26.0–34.0)
MCHC: 33.7 g/dL (ref 30.0–36.0)
MCV: 95.7 fL (ref 80.0–100.0)
Monocytes Absolute: 0.4 10*3/uL (ref 0.1–1.0)
Monocytes Relative: 7 %
Neutro Abs: 4.2 10*3/uL (ref 1.7–7.7)
Neutrophils Relative %: 71 %
Platelets: 192 10*3/uL (ref 150–400)
RBC: 3.5 MIL/uL — ABNORMAL LOW (ref 4.22–5.81)
RDW: 13.2 % (ref 11.5–15.5)
WBC: 5.8 10*3/uL (ref 4.0–10.5)
nRBC: 0 % (ref 0.0–0.2)

## 2021-06-29 LAB — PSA: Prostatic Specific Antigen: <0.01 ng/mL (ref 0.00–4.00)

## 2021-07-03 ENCOUNTER — Inpatient Hospital Stay: Payer: Medicare HMO | Admitting: Oncology

## 2021-07-03 ENCOUNTER — Inpatient Hospital Stay: Payer: Medicare HMO

## 2021-07-03 ENCOUNTER — Encounter: Payer: Self-pay | Admitting: Oncology

## 2021-07-03 ENCOUNTER — Ambulatory Visit: Payer: Medicare HMO | Admitting: Dermatology

## 2021-07-03 ENCOUNTER — Other Ambulatory Visit: Payer: Self-pay

## 2021-07-03 VITALS — BP 133/50 | HR 76 | Temp 98.6°F | Resp 18 | Wt 146.2 lb

## 2021-07-03 DIAGNOSIS — C61 Malignant neoplasm of prostate: Secondary | ICD-10-CM

## 2021-07-03 DIAGNOSIS — IMO0001 Reserved for inherently not codable concepts without codable children: Secondary | ICD-10-CM

## 2021-07-03 DIAGNOSIS — D696 Thrombocytopenia, unspecified: Secondary | ICD-10-CM

## 2021-07-03 DIAGNOSIS — Z79818 Long term (current) use of other agents affecting estrogen receptors and estrogen levels: Secondary | ICD-10-CM | POA: Diagnosis not present

## 2021-07-03 DIAGNOSIS — E538 Deficiency of other specified B group vitamins: Secondary | ICD-10-CM

## 2021-07-03 DIAGNOSIS — D649 Anemia, unspecified: Secondary | ICD-10-CM

## 2021-07-03 DIAGNOSIS — R911 Solitary pulmonary nodule: Secondary | ICD-10-CM | POA: Diagnosis not present

## 2021-07-03 DIAGNOSIS — Z5111 Encounter for antineoplastic chemotherapy: Secondary | ICD-10-CM | POA: Diagnosis not present

## 2021-07-03 MED ORDER — LEUPROLIDE ACETATE (4 MONTH) 30 MG IM KIT: 30.0000 mg | PACK | Freq: Once | INTRAMUSCULAR | Status: DC

## 2021-07-03 MED ORDER — LEUPROLIDE ACETATE (4 MONTH) 30 MG ~~LOC~~ KIT
30.0000 mg | PACK | Freq: Once | SUBCUTANEOUS | Status: AC
  Administered 2021-07-03: 30 mg via SUBCUTANEOUS
  Filled 2021-07-03: qty 30

## 2021-07-03 NOTE — Progress Notes (Signed)
Hematology/Oncology follow up note [facility] [phone] Fax:([phone]   Patient Care Team: Cletis Athens, MD as PCP - General (Internal Medicine) Noreene Filbert, MD as Radiation Oncologist (Radiation Oncology)  REFERRING PROVIDER: Cletis Athens, MD  CHIEF COMPLAINTS/REASON FOR VISIT:  Follow-up for prostate cancer  HISTORY OF PRESENTING ILLNESS:   Mathew Martinez is a  87 y.o.  male with PMH listed below was seen in consultation at the request of  Cletis Athens, MD  for evaluation of prostate cancer Patient was seen by urologist Dr. Bernardo Heater in February for evaluation of elevated PSA.  PSA drawn in November 2020 was elevated at 11.4.  No previous PSA results available.  Per patient, PSA typically fluctuates between 4 and 7 in the past. Also reports remote prostate biopsy and prostate surgery many years ago.  Patient denies any lower urinary tract symptoms.  A repeat PSA was drawn at Dr. Clydene Laming office and that level increased to 15. 12/15/2019, patient underwent prostate biopsy Pathology report was scanned in epic. Left prostate biopsies showed benign pathology. Right base, right mid, right lateral base, right lateral mid, right lateral apex biopsies were positive for adenocarcinoma. 4 out of the 5 biopsies have Gleason score 7 (4+3), right apex has Gleason score 8 (4+4).  Patient was recommended by urologist to complete staging images.  Patient prefers to be referred to cancer center for counseling and management.  Today patient was accompanied by his wife.  He reports feeling well at baseline.  No urinary symptoms. Denies any fever, chills, unintentional weight loss, abdominal pain, bone pain. He is quite active at baseline.  He does push-up exercise.  -01/12/2020 CT chest abdomen pelvis  1.8 cm right lower lobe pulmonary nodule, metastatic lung disease versus primary lung cancer.  Otherwise no potential findings of metastatic disease in the  chest abdomen or pelvis.  Patient has three-vessel coronary office marked diffuse colonic diverticulosis.  Emphysema - PET Rectal normal lung nodule has very low FDG activity on PET scan, likely not metastatic prostate cancer. Low-grade primary lung cancer-ie, adenocarcinoma cannot be ruled out.  Patient has history of tobacco use   Patient has life expectancy> 5 years,I recommend radiation plus androgen deprivation therapy for 1-3 years.  Rationale and potential side effects of androgen deprivation therapy were discussed with patient.  #  2 doses of Firmagon with last dose of Firmagon on 02/18/2020. # switched to Eligard on 03/17/2020.  # For his 1.8 cm right lower lobe lung nodule, CT shows stable size.  Radonc recommend to continue observation rather than SBRT now.   INTERVAL HISTORY Mathew Martinez is a 87 y.o. male who has above history reviewed by me today presents for follow up visit for management of prostate cancer Problems and complaints are listed below: Chronic urinary incontinence which improves after taking Flomax No new complaints.    Review of Systems  Constitutional:  Negative for appetite change, chills, fatigue, fever and unexpected weight change.  HENT:   Negative for hearing loss and voice change.   Eyes:  Negative for eye problems and icterus.  Respiratory:  Negative for chest tightness, cough and shortness of breath.   Cardiovascular:  Negative for chest pain and leg swelling.  Gastrointestinal:  Negative for abdominal distention and abdominal pain.  Endocrine: Negative for hot flashes.  Genitourinary:  Negative for difficulty urinating, dysuria and frequency.        Urinary incontinence  Musculoskeletal:  Negative for arthralgias.  Skin:  Negative for  itching and rash.  Neurological:  Negative for light-headedness and numbness.  Hematological:  Negative for adenopathy. Does not bruise/bleed easily.  Psychiatric/Behavioral:  Negative for confusion.     MEDICAL HISTORY:  Past Medical History:  Diagnosis Date   Arthritis    wrist   Basal cell carcinoma 02/25/2013   Left mastoid area. Excised, margins free.   Basal cell carcinoma 03/04/2013   Left posterior shoulder. Excised, margins free.   BPH (benign prostatic hyperplasia)    Dysplastic nevus 09/14/2013   Right upper back post. shoulder. Moderate to severe atypia, close to margin.   Dysplastic nevus 09/14/2013   Right sup. popliteal. Moderate atypia, lateral and deep margins involved.   Dysplastic nevus 09/14/2013   Right proximal med. calf. Moderate atypia, deep margin involved.    Dysplastic nevus 09/14/2013   Right distal med. calf. Moderate atypia, lateral and deep margins involved.    Hard of hearing    Heart murmur    History of basal cell carcinoma 02/18/2013   Right posterior wrist. Excised, margins free.   History of melanoma 01/14/2013   left upper back   Hyperlipemia    Hypertension    Prostate cancer ([place]) 12/29/2019    SURGICAL HISTORY: Past Surgical History:  Procedure Laterality Date   APPENDECTOMY     CATARACT EXTRACTION W/PHACO Right 03/19/2018   Procedure: CATARACT EXTRACTION PHACO AND INTRAOCULAR LENS PLACEMENT ([place]) COMPLICATED RIGHT;  Surgeon: Leandrew Koyanagi, MD;  Location: [place];  Service: Ophthalmology;  Laterality: Right;  MALYUGIN   CHOLECYSTECTOMY     COLONOSCOPY WITH PROPOFOL N/A 07/15/2015   Procedure: COLONOSCOPY WITH PROPOFOL;  Surgeon: Josefine Class, MD;  Location: [facility] ENDOSCOPY;  Service: Endoscopy;  Laterality: N/A;   Duodenojejunostomy     HERNIA REPAIR     prostate tissue removal  1996    SOCIAL HISTORY: Social History   Socioeconomic History   Marital status: Married    Spouse name: Not on file   Number of children: Not on file   Years of education: Not on file   Highest education level: Not on file  Occupational History   Occupation: retired  Tobacco Use   Smoking status: Former    Packs/day:  0.50    Years: 50.00    Pack years: 25.00    Types: Cigarettes    Quit date: 12/29/1999    Years since quitting: 21.5   Smokeless tobacco: Never  Vaping Use   Vaping Use: Never used  Substance and Sexual Activity   Alcohol use: Never   Drug use: Never   Sexual activity: Not on file  Other Topics Concern   Not on file  Social History Narrative   Not on file   Social Determinants of Health   Financial Resource Strain: Not on file  Food Insecurity: Not on file  Transportation Needs: Not on file  Physical Activity: Not on file  Stress: Not on file  Social Connections: Not on file  Intimate Partner Violence: Not on file    FAMILY HISTORY: Family History  Problem Relation Age of Onset   Heart disease Mother    Diabetes Mother    Cancer Father    Prostate cancer Brother     ALLERGIES:  is allergic to lisinopril.  MEDICATIONS:  Current Outpatient Medications  Medication Sig Dispense Refill   Calcium Carbonate-Vit D-Min (CALCIUM 1200 PO) Take 600 mg by mouth.     Cholecalciferol 25 MCG (1000 UT) tablet Take 1,000 Units by mouth daily.  co-enzyme Q-10 30 MG capsule Take 30 mg by mouth daily.     doxazosin (CARDURA) 4 MG tablet Take 1 tablet (4 mg total) by mouth daily. 30 tablet 6   ferrous sulfate 325 (65 FE) MG tablet Take 65 mg by mouth daily with breakfast.      ketoconazole (NIZORAL) 2 % cream Apply to the feet QHS 60 g 3   loratadine (CLARITIN) 10 MG tablet Take 1 tablet (10 mg total) by mouth daily. 30 tablet 11   lovastatin (MEVACOR) 20 MG tablet Take 1 tablet (20 mg total) by mouth at bedtime. 90 tablet 3   metoprolol succinate (TOPROL-XL) 25 MG 24 hr tablet Take 1 tablet (25 mg total) by mouth daily. 90 tablet 0   mirabegron ER (MYRBETRIQ) 25 MG TB24 tablet Take 1 tablet (25 mg total) by mouth daily. 30 tablet 3   Multiple Vitamins-Minerals (MULTIVITAMIN WITH MINERALS) tablet Take 1 tablet by mouth daily.     Omega-3 Fatty Acids (FISH OIL) 500 MG CAPS Take 500  mg by mouth.     TURMERIC CURCUMIN PO Take 550 mg by mouth.     vitamin B-12 (CYANOCOBALAMIN) 1000 MCG tablet Take 1 tablet (1,000 mcg total) by mouth daily. 90 tablet 1   vitamin C (ASCORBIC ACID) 500 MG tablet Take 500 mg by mouth daily.     No current facility-administered medications for this visit.     PHYSICAL EXAMINATION: ECOG PERFORMANCE STATUS: 0 - Asymptomatic Vitals:   07/03/21 1304  BP: (!) 133/50  Pulse: 76  Resp: 18  Temp: 98.6 F (37 C)   Filed Weights   07/03/21 1304  Weight: 146 lb 3.2 oz (66.3 kg)    Physical Exam Constitutional:      General: He is not in acute distress. HENT:     Head: Normocephalic and atraumatic.  Eyes:     General: No scleral icterus. Cardiovascular:     Rate and Rhythm: Normal rate and regular rhythm.     Heart sounds: Normal heart sounds.  Pulmonary:     Effort: Pulmonary effort is normal. No respiratory distress.     Breath sounds: No wheezing.  Abdominal:     General: Bowel sounds are normal. There is no distension.     Palpations: Abdomen is soft.  Musculoskeletal:        General: No deformity. Normal range of motion.     Cervical back: Normal range of motion and neck supple.  Skin:    General: Skin is warm and dry.     Findings: No erythema or rash.  Neurological:     Mental Status: He is alert and oriented to person, place, and time. Mental status is at baseline.     Cranial Nerves: No cranial nerve deficit.     Coordination: Coordination normal.  Psychiatric:        Mood and Affect: Mood normal.      LABORATORY DATA:  I have reviewed the data as listed Lab Results  Component Value Date   WBC 5.8 06/29/2021   HGB 11.3 (L) 06/29/2021   HCT 33.5 (L) 06/29/2021   MCV 95.7 06/29/2021   PLT 192 06/29/2021   Recent Labs    07/19/20 0936 10/17/20 0924 02/27/21 1057 06/29/21 1121  NA 138 138 138 139  K 4.1 3.9 4.3 4.2  CL 103 103 103 106  CO2 '26 25 25 25  '$ GLUCOSE 156* 129* 144* 143*  BUN 15 24* 15 11   CREATININE 1.00  0.94 0.95 0.87  CALCIUM 8.7* 9.3 8.7* 8.5*  GFRNONAA >60 >60 >60 >60  GFRAA >60  --   --   --   PROT 6.5 7.1 6.8 6.7  ALBUMIN 3.9 4.3 3.7 3.7  AST '27 29 21 28  '$ ALT 28 33 17 24  ALKPHOS 58 65 65 64  BILITOT 1.2 1.9* 1.3* 1.2    Iron/TIBC/Ferritin/ %Sat    Component Value Date/Time   IRON 71 02/28/2021 1059   TIBC 260 02/28/2021 1059   FERRITIN 413 (H) 02/28/2021 1059   IRONPCTSAT 27 02/28/2021 1059      RADIOGRAPHIC STUDIES: I have personally reviewed the radiological images as listed and agreed with the findings in the report. CT CHEST WO CONTRAST  Result Date: 05/06/2021 CLINICAL DATA:  Prostate cancer, follow-up right lower lobe pulmonary nodule EXAM: CT CHEST WITHOUT CONTRAST TECHNIQUE: Multidetector CT imaging of the chest was performed following the standard protocol without IV contrast. COMPARISON:  05/04/2020 FINDINGS: Cardiovascular: The heart is normal in size. No pericardial effusion. No evidence of thoracic aortic aneurysm. Atherosclerotic calcifications of the aortic arch. Three vessel coronary atherosclerosis. Mediastinum/Nodes: No suspicious mediastinal lymphadenopathy. Visualized thyroid is unremarkable. Lungs/Pleura: 18 x 12 mm rounded nodule in the right lower lobe (series 3/image 107), unchanged and non FDG avid on prior PET, benign. 6 x 9 mm irregular nodule in the posterior right upper lobe with mild surrounding ground-glass opacity and an additional 4 mm peribronchovascular nodule (series 3/image 64), new, possibly infectious/inflammatory. No focal consolidation. Mild centrilobular and paraseptal emphysematous changes, upper lung predominant. Mild biapical pleural-parenchymal scarring. Bronchiectasis with scarring and subpleural reticulation in the bilateral lower lobes, likely post infectious/inflammatory. No pleural effusion or pneumothorax. Upper Abdomen: Visualized upper abdomen is notable for prior cholecystectomy, postsurgical changes involving  the GE junction and proximal stomach, and vascular calcifications. Musculoskeletal: No focal osseous lesions. IMPRESSION: 18 x 12 mm rounded nodule in the right lower lobe remains unchanged, benign. However, there is a new 6 x 9 mm irregular nodule in the posterior right upper lobe with mild surrounding ground-glass opacity, favored to be infectious/inflammatory, but warranting attention on follow-up. Consider follow-up CT chest in 3 months after appropriate antimicrobial therapy to document resolution. Aortic Atherosclerosis (ICD10-I70.0) and Emphysema (ICD10-J43.9). Electronically Signed   By: Julian Hy M.D.   On: 05/06/2021 21:22       ASSESSMENT & PLAN:  1. Prostate cancer ([place])   2. Thrombocytopenia ([place])   3. Normocytic anemia   4. B12 deficiency   5. Androgen deprivation therapy   6. Lung nodule   T2bN0M0 prostate cancer was discussed. #Risk stratification Grade group 4/Gleason 8 (4+4), PSA 15,  high risk group.   Finished radiation iron recommend continue androgen deprivation therapy for 1 to 3 years.  Labs are reviewed and discussed with patient. Proceed with Eligard '30mg'$  x 1 today.  Jan 2023 last dose of Eligard.  PSA is stably <0.01  #Lung nodule, cannot rule out underlying malignancy.   05/05/2021, stable 18 x 67m RLL nodule. Likely benign. New 6x940mirregular nodule in RUL.   repeat CT chest without contrast in Oct 2022.  #Chronic anemia, normocytic. Hb 11.3 Check iron panel, folate level.retic panel.   #Vitamin B12 deficiency,continue over-the-counter B12 1000 MCG daily.  Orders Placed This Encounter  Procedures   CT Chest Wo Contrast    Standing Status:   Future    Standing Expiration Date:   07/03/2022    Order Specific Question:   Preferred imaging location?  Answer:   [facility]   Comprehensive metabolic panel    Standing Status:   Future    Standing Expiration Date:   07/03/2022   CBC with Differential/Platelet    Standing Status:   Future     Standing Expiration Date:   07/03/2022   PSA    Standing Status:   Future    Standing Expiration Date:   07/03/2022    All questions were answered. The patient knows to call the clinic with any problems questions or concerns.   Return of visit:  4 months.   Earlie Server, MD, PhD Hematology Oncology [facility] at [facility] Pager- [phone] 07/03/2021

## 2021-07-03 NOTE — Progress Notes (Signed)
Patient here for follow up. No new concerns voiced.  °

## 2021-07-05 ENCOUNTER — Ambulatory Visit (INDEPENDENT_AMBULATORY_CARE_PROVIDER_SITE_OTHER): Payer: Medicare HMO | Admitting: Internal Medicine

## 2021-07-05 ENCOUNTER — Other Ambulatory Visit: Payer: Self-pay

## 2021-07-05 ENCOUNTER — Encounter: Payer: Self-pay | Admitting: Internal Medicine

## 2021-07-05 DIAGNOSIS — D649 Anemia, unspecified: Secondary | ICD-10-CM

## 2021-07-05 DIAGNOSIS — I10 Essential (primary) hypertension: Secondary | ICD-10-CM

## 2021-07-05 DIAGNOSIS — D696 Thrombocytopenia, unspecified: Secondary | ICD-10-CM

## 2021-07-05 DIAGNOSIS — E7800 Pure hypercholesterolemia, unspecified: Secondary | ICD-10-CM | POA: Diagnosis not present

## 2021-07-05 DIAGNOSIS — Z0000 Encounter for general adult medical examination without abnormal findings: Secondary | ICD-10-CM | POA: Diagnosis not present

## 2021-07-05 DIAGNOSIS — C61 Malignant neoplasm of prostate: Secondary | ICD-10-CM

## 2021-07-05 DIAGNOSIS — S43421A Sprain of right rotator cuff capsule, initial encounter: Secondary | ICD-10-CM

## 2021-07-05 NOTE — Progress Notes (Signed)
Established Patient Office Visit  Subjective:  Patient ID: Mathew Martinez, male    DOB: 1931/03/29  Age: 87 y.o. MRN: [id number]  CC:  Chief Complaint  Patient presents with   Annual Exam    HPI  Mathew Martinez presents for physical Past Medical History:  Diagnosis Date   Arthritis    wrist   Basal cell carcinoma 02/25/2013   Left mastoid area. Excised, margins free.   Basal cell carcinoma 03/04/2013   Left posterior shoulder. Excised, margins free.   BPH (benign prostatic hyperplasia)    Dysplastic nevus 09/14/2013   Right upper back post. shoulder. Moderate to severe atypia, close to margin.   Dysplastic nevus 09/14/2013   Right sup. popliteal. Moderate atypia, lateral and deep margins involved.   Dysplastic nevus 09/14/2013   Right proximal med. calf. Moderate atypia, deep margin involved.    Dysplastic nevus 09/14/2013   Right distal med. calf. Moderate atypia, lateral and deep margins involved.    Hard of hearing    Heart murmur    History of basal cell carcinoma 02/18/2013   Right posterior wrist. Excised, margins free.   History of melanoma 01/14/2013   left upper back   Hyperlipemia    Hypertension    Prostate cancer ([place]) 12/29/2019    Past Surgical History:  Procedure Laterality Date   APPENDECTOMY     CATARACT EXTRACTION W/PHACO Right 03/19/2018   Procedure: CATARACT EXTRACTION PHACO AND INTRAOCULAR LENS PLACEMENT ([place]) COMPLICATED RIGHT;  Surgeon: Leandrew Koyanagi, MD;  Location: [place];  Service: Ophthalmology;  Laterality: Right;  MALYUGIN   CHOLECYSTECTOMY     COLONOSCOPY WITH PROPOFOL N/A 07/15/2015   Procedure: COLONOSCOPY WITH PROPOFOL;  Surgeon: Josefine Class, MD;  [facility] ENDOSCOPY;  Service: Endoscopy;  Laterality: N/A;   Duodenojejunostomy     HERNIA REPAIR     prostate tissue removal  1996    Family History  Problem Relation Age of Onset   Heart disease Mother    Diabetes Mother    Cancer Father     Prostate cancer Brother     Social History   Socioeconomic History   Marital status: Married    Spouse name: Not on file   Number of children: Not on file   Years of education: Not on file   Highest education level: Not on file  Occupational History   Occupation: retired  Tobacco Use   Smoking status: Former    Packs/day: 0.50    Years: 50.00    Pack years: 25.00    Types: Cigarettes    Quit date: 12/29/1999    Years since quitting: 21.5   Smokeless tobacco: Never  Vaping Use   Vaping Use: Never used  Substance and Sexual Activity   Alcohol use: Never   Drug use: Never   Sexual activity: Not on file  Other Topics Concern   Not on file  Social History Narrative   Not on file   Social Determinants of Health   Financial Resource Strain: Not on file  Food Insecurity: Not on file  Transportation Needs: Not on file  Physical Activity: Not on file  Stress: Not on file  Social Connections: Not on file  Intimate Partner Violence: Not on file     Current Outpatient Medications:    Calcium Carbonate-Vit D-Min (CALCIUM 1200 PO), Take 600 mg by mouth., Disp: , Rfl:    Cholecalciferol 25 MCG (1000 UT) tablet, Take 1,000 Units by mouth daily., Disp: ,  Rfl:    co-enzyme Q-10 30 MG capsule, Take 30 mg by mouth daily., Disp: , Rfl:    doxazosin (CARDURA) 4 MG tablet, Take 1 tablet (4 mg total) by mouth daily., Disp: 30 tablet, Rfl: 6   ferrous sulfate 325 (65 FE) MG tablet, Take 65 mg by mouth daily with breakfast. , Disp: , Rfl:    ketoconazole (NIZORAL) 2 % cream, Apply to the feet QHS, Disp: 60 g, Rfl: 3   loratadine (CLARITIN) 10 MG tablet, Take 1 tablet (10 mg total) by mouth daily., Disp: 30 tablet, Rfl: 11   lovastatin (MEVACOR) 20 MG tablet, Take 1 tablet (20 mg total) by mouth at bedtime., Disp: 90 tablet, Rfl: 3   metoprolol succinate (TOPROL-XL) 25 MG 24 hr tablet, Take 1 tablet (25 mg total) by mouth daily., Disp: 90 tablet, Rfl: 0   mirabegron ER (MYRBETRIQ) 25 MG TB24  tablet, Take 1 tablet (25 mg total) by mouth daily., Disp: 30 tablet, Rfl: 3   Multiple Vitamins-Minerals (MULTIVITAMIN WITH MINERALS) tablet, Take 1 tablet by mouth daily., Disp: , Rfl:    Omega-3 Fatty Acids (FISH OIL) 500 MG CAPS, Take 500 mg by mouth., Disp: , Rfl:    TURMERIC CURCUMIN PO, Take 550 mg by mouth., Disp: , Rfl:    vitamin B-12 (CYANOCOBALAMIN) 1000 MCG tablet, Take 1 tablet (1,000 mcg total) by mouth daily., Disp: 90 tablet, Rfl: 1   vitamin C (ASCORBIC ACID) 500 MG tablet, Take 500 mg by mouth daily., Disp: , Rfl:    Allergies  Allergen Reactions   Lisinopril Other (See Comments)    Face Swelling    ROS Review of Systems  Constitutional: Negative.   HENT: Negative.    Eyes: Negative.   Respiratory: Negative.    Cardiovascular: Negative.   Gastrointestinal: Negative.   Endocrine: Negative.   Genitourinary: Negative.   Musculoskeletal: Negative.   Skin: Negative.   Allergic/Immunologic: Negative.   Neurological: Negative.   Hematological: Negative.   Psychiatric/Behavioral: Negative.    All other systems reviewed and are negative.    Objective:    Physical Exam Vitals reviewed.  Constitutional:      Appearance: Normal appearance.  HENT:     Mouth/Throat:     Mouth: Mucous membranes are moist.  Eyes:     Pupils: Pupils are equal, round, and reactive to light.  Neck:     Vascular: No carotid bruit.  Cardiovascular:     Rate and Rhythm: Normal rate and regular rhythm.     Pulses: Normal pulses.     Heart sounds: Normal heart sounds.  Pulmonary:     Effort: Pulmonary effort is normal.     Breath sounds: Normal breath sounds.  Abdominal:     General: Bowel sounds are normal.     Palpations: Abdomen is soft. There is no hepatomegaly, splenomegaly or mass.     Tenderness: There is no abdominal tenderness.     Hernia: No hernia is present.  Musculoskeletal:     Cervical back: Neck supple.     Right lower leg: No edema.     Left lower leg: No edema.   Skin:    Findings: No rash.  Neurological:     Mental Status: He is alert and oriented to person, place, and time.     Motor: No weakness.  Psychiatric:        Mood and Affect: Mood normal.        Behavior: Behavior normal.    BP  136/67   Pulse 68   Ht '5\' 9"'$  (1.753 m)   Wt 146 lb 3.2 oz (66.3 kg)   BMI 21.59 kg/m  Wt Readings from Last 3 Encounters:  07/05/21 146 lb 3.2 oz (66.3 kg)  07/03/21 146 lb 3.2 oz (66.3 kg)  06/08/21 (P) 147 lb 11.2 oz (67 kg)     Health Maintenance Due  Topic Date Due   TETANUS/TDAP  Never done   Zoster Vaccines- Shingrix (1 of 2) Never done   PNA vac Low Risk Adult (1 of 2 - PCV13) Never done    There are no preventive care reminders to display for this patient.  Lab Results  Component Value Date   TSH 1.429 02/18/2020   Lab Results  Component Value Date   WBC 5.8 06/29/2021   HGB 11.3 (L) 06/29/2021   HCT 33.5 (L) 06/29/2021   MCV 95.7 06/29/2021   PLT 192 06/29/2021   Lab Results  Component Value Date   NA 139 06/29/2021   K 4.2 06/29/2021   CO2 25 06/29/2021   GLUCOSE 143 (H) 06/29/2021   BUN 11 06/29/2021   CREATININE 0.87 06/29/2021   BILITOT 1.2 06/29/2021   ALKPHOS 64 06/29/2021   AST 28 06/29/2021   ALT 24 06/29/2021   PROT 6.7 06/29/2021   ALBUMIN 3.7 06/29/2021   CALCIUM 8.5 (L) 06/29/2021   ANIONGAP 8 06/29/2021   No results found for: CHOL No results found for: HDL No results found for: LDLCALC No results found for: TRIG No results found for: CHOLHDL No results found for: HGBA1C    Assessment & Plan:   Problem List Items Addressed This Visit       Cardiovascular and Mediastinum   Essential hypertension     Patient denies any chest pain or shortness of breath there is no history of palpitation or paroxysmal nocturnal dyspnea   patient was advised to follow low-salt low-cholesterol diet    ideally I want to keep systolic blood pressure below 130 mmHg, patient was asked to check blood pressure one  times a week and give me a report on that.  Patient will be follow-up in 3 months  or earlier as needed, patient will call me back for any change in the cardiovascular symptoms Patient was advised to buy a book from local bookstore concerning blood pressure and read several chapters  every day.  This will be supplemented by some of the material we will give him from the office.  Patient should also utilize other resources like YouTube and Internet to learn more about the blood pressure and the diet.        Musculoskeletal and Integument   Sprain of right rotator cuff capsule    Stable        Genitourinary   Prostate cancer (HCC)    PSA data was reviewed with the patient        Other   Hyperlipidemia    Hypercholesterolemia  I advised the patient to follow Mediterranean diet This diet is rich in fruits vegetables and whole grain, and This diet is also rich in fish and lean meat Patient should also eat a handful of almonds or walnuts daily Recent heart study indicated that average follow-up on this kind of diet reduces the cardiovascular mortality by 50 to 70%==      Thrombocytopenia (HCC)    Stable at the present time      Normocytic anemia    Stable no history of GI bleeding  Patient will abdominal CT with a 6 mm nodule is being followed up by CT scan       No orders of the defined types were placed in this encounter.   Follow-up: No follow-ups on file.    Cletis Athens, MD

## 2021-07-05 NOTE — Assessment & Plan Note (Signed)

## 2021-07-05 NOTE — Assessment & Plan Note (Signed)
Stable no history of GI bleeding Patient will abdominal CT with a 6 mm nodule is being followed up by CT scan

## 2021-07-05 NOTE — Assessment & Plan Note (Signed)
PSA data was reviewed with the patient

## 2021-07-05 NOTE — Assessment & Plan Note (Signed)
Hypercholesterolemia  I advised the patient to follow Mediterranean diet This diet is rich in fruits vegetables and whole grain, and This diet is also rich in fish and lean meat Patient should also eat a handful of almonds or walnuts daily Recent heart study indicated that average follow-up on this kind of diet reduces the cardiovascular mortality by 50 to 70%== 

## 2021-07-05 NOTE — Assessment & Plan Note (Signed)
Stable

## 2021-07-05 NOTE — Assessment & Plan Note (Signed)
Stable at the present time. 

## 2021-07-13 ENCOUNTER — Other Ambulatory Visit: Payer: Self-pay | Admitting: Internal Medicine

## 2021-07-24 ENCOUNTER — Other Ambulatory Visit: Payer: Self-pay | Admitting: Internal Medicine

## 2021-08-04 ENCOUNTER — Other Ambulatory Visit: Payer: Self-pay

## 2021-08-04 ENCOUNTER — Ambulatory Visit
Admission: RE | Admit: 2021-08-04 | Discharge: 2021-08-04 | Disposition: A | Discharge status: Home / Self Care | Payer: Medicare HMO | Source: Ambulatory Visit | Attending: Oncology | Admitting: Oncology

## 2021-08-04 DIAGNOSIS — R911 Solitary pulmonary nodule: Secondary | ICD-10-CM | POA: Insufficient documentation

## 2021-08-04 DIAGNOSIS — J439 Emphysema, unspecified: Secondary | ICD-10-CM | POA: Diagnosis not present

## 2021-08-04 DIAGNOSIS — I700 Atherosclerosis of aorta: Secondary | ICD-10-CM | POA: Diagnosis not present

## 2021-08-21 ENCOUNTER — Other Ambulatory Visit: Payer: Self-pay | Admitting: Internal Medicine

## 2021-08-23 ENCOUNTER — Other Ambulatory Visit: Payer: Self-pay

## 2021-08-23 ENCOUNTER — Ambulatory Visit: Payer: Medicare HMO | Admitting: Dermatology

## 2021-08-23 DIAGNOSIS — Z1283 Encounter for screening for malignant neoplasm of skin: Secondary | ICD-10-CM

## 2021-08-23 DIAGNOSIS — L814 Other melanin hyperpigmentation: Secondary | ICD-10-CM

## 2021-08-23 DIAGNOSIS — L821 Other seborrheic keratosis: Secondary | ICD-10-CM

## 2021-08-23 DIAGNOSIS — L820 Inflamed seborrheic keratosis: Secondary | ICD-10-CM

## 2021-08-23 DIAGNOSIS — L578 Other skin changes due to chronic exposure to nonionizing radiation: Secondary | ICD-10-CM

## 2021-08-23 DIAGNOSIS — D229 Melanocytic nevi, unspecified: Secondary | ICD-10-CM | POA: Diagnosis not present

## 2021-08-23 DIAGNOSIS — Z86018 Personal history of other benign neoplasm: Secondary | ICD-10-CM

## 2021-08-23 DIAGNOSIS — D1800 Hemangioma unspecified site: Secondary | ICD-10-CM | POA: Diagnosis not present

## 2021-08-23 DIAGNOSIS — L720 Epidermal cyst: Secondary | ICD-10-CM | POA: Diagnosis not present

## 2021-08-23 DIAGNOSIS — Z85828 Personal history of other malignant neoplasm of skin: Secondary | ICD-10-CM | POA: Diagnosis not present

## 2021-08-23 DIAGNOSIS — L570 Actinic keratosis: Secondary | ICD-10-CM | POA: Diagnosis not present

## 2021-08-23 NOTE — Progress Notes (Signed)
Follow-Up Visit   Subjective  Mathew Martinez is a 87 y.o. male who presents for the following: Annual Exam (Hx BCC and dysplastic nevi - patient has noticed lesions on his back and abdomen that he would like checked today.). The patient presents for Total-Body Skin Exam (TBSE) for skin cancer screening and mole check.  The following portions of the chart were reviewed this encounter and updated as appropriate:   Tobacco  Allergies  Meds  Problems  Med Hx  Surg Hx  Fam Hx     Review of Systems:  No other skin or systemic complaints except as noted in HPI or Assessment and Plan.  Objective  Well appearing patient in no apparent distress; mood and affect are within normal limits.  A full examination was performed including scalp, head, eyes, ears, nose, lips, neck, chest, axillae, abdomen, back, buttocks, bilateral upper extremities, bilateral lower extremities, hands, feet, fingers, toes, fingernails, and toenails. All findings within normal limits unless otherwise noted below.  Scalp x 2 (2) Erythematous thin papules/macules with gritty scale.   R inf cheek lat to inf naso labial Subcutaneous nodule.   R scapula x 1, R abdomen x 1 (2) Erythematous keratotic or waxy stuck-on papule or plaque.    Assessment & Plan  AK (actinic keratosis) (2) Scalp x 2  Destruction of lesion - Scalp x 2 Complexity: simple   Destruction method: cryotherapy   Informed consent: discussed and consent obtained   Timeout:  patient name, date of birth, surgical site, and procedure verified Lesion destroyed using liquid nitrogen: Yes   Region frozen until ice ball extended beyond lesion: Yes   Outcome: patient tolerated procedure well with no complications   Post-procedure details: wound care instructions given    Epidermal inclusion cyst R inf cheek lat to inf naso labial  Benign-appearing. Exam most consistent with an epidermal inclusion cyst. Discussed that a cyst is a benign growth  that can grow over time and sometimes get irritated or inflamed. Recommend observation if it is not bothersome. Discussed option of surgical excision to remove it if it is growing, symptomatic, or other changes noted. Please call for new or changing lesions so they can be evaluated.  Inflamed seborrheic keratosis R scapula x 1, R abdomen x 1  Destruction of lesion - R scapula x 1, R abdomen x 1 Complexity: simple   Destruction method: cryotherapy   Informed consent: discussed and consent obtained   Timeout:  patient name, date of birth, surgical site, and procedure verified Lesion destroyed using liquid nitrogen: Yes   Region frozen until ice ball extended beyond lesion: Yes   Outcome: patient tolerated procedure well with no complications   Post-procedure details: wound care instructions given    Skin cancer screening  Lentigines - Scattered tan macules - Due to sun exposure - Benign-appearing, observe - Recommend daily broad spectrum sunscreen SPF 30+ to sun-exposed areas, reapply every 2 hours as needed. - Call for any changes  Seborrheic Keratoses - Stuck-on, waxy, tan-brown papules and/or plaques  - Benign-appearing - Discussed benign etiology and prognosis. - Observe - Call for any changes  Melanocytic Nevi - Tan-brown and/or pink-flesh-colored symmetric macules and papules - Benign appearing on exam today - Observation - Call clinic for new or changing moles - Recommend daily use of broad spectrum spf 30+ sunscreen to sun-exposed areas.   Hemangiomas - Red papules - Discussed benign nature - Observe - Call for any changes  Actinic Damage - Chronic condition,  secondary to cumulative UV/sun exposure - diffuse scaly erythematous macules with underlying dyspigmentation - Recommend daily broad spectrum sunscreen SPF 30+ to sun-exposed areas, reapply every 2 hours as needed.  - Staying in the shade or wearing long sleeves, sun glasses (UVA+UVB protection) and wide  brim hats (4-inch brim around the entire circumference of the hat) are also recommended for sun protection.  - Call for new or changing lesions.  History of Basal Cell Carcinoma of the Skin - No evidence of recurrence today - Recommend regular full body skin exams - Recommend daily broad spectrum sunscreen SPF 30+ to sun-exposed areas, reapply every 2 hours as needed.  - Call if any new or changing lesions are noted between office visits  History of Dysplastic Nevi - No evidence of recurrence today - Recommend regular full body skin exams - Recommend daily broad spectrum sunscreen SPF 30+ to sun-exposed areas, reapply every 2 hours as needed.  - Call if any new or changing lesions are noted between office visits  Skin cancer screening performed today.  Return in about 1 year (around 08/23/2022) for TBSE; surgery for cyst excision.  Luther Redo, CMA, am acting as scribe for Sarina Ser, MD . Documentation: I have reviewed the above documentation for accuracy and completeness, and I agree with the above.  Sarina Ser, MD

## 2021-08-23 NOTE — Patient Instructions (Addendum)
If you have any questions or concerns for your doctor, please call our main line at [phone] and press option 4 to reach your doctor's medical assistant. If no one answers, please leave a voicemail as directed and we will return your call as soon as possible. Messages left after 4 pm will be answered the following business day.   You may also send us a message via MyChart. We typically respond to MyChart messages within 1-2 business days.  For prescription refills, please ask your pharmacy to contact our office. Our fax number is [phone].  If you have an urgent issue when the clinic is closed that cannot wait until the next business day, you can page your doctor at the number below.    Please note that while we do our best to be available for urgent issues outside of office hours, we are not available 24/7.   If you have an urgent issue and are unable to reach us, you may choose to seek medical care at your doctor's office, retail clinic, urgent care center, or emergency room.  If you have a medical emergency, please immediately call 911 or go to the emergency department.  Pager Numbers  - Dr. Kowalski: [phone]  - Dr. Moye: [phone]  - Dr. Stewart: [phone]  In the event of inclement weather, please call our main line at [phone] for an update on the status of any delays or closures.  Dermatology Medication Tips: Please keep the boxes that topical medications come in in order to help keep track of the instructions about where and how to use these. Pharmacies typically print the medication instructions only on the boxes and not directly on the medication tubes.   If your medication is too expensive, please contact our office at [phone] option 4 or send us a message through MyChart.   We are unable to tell what your co-pay for medications will be in advance as this is different depending on your insurance coverage. However, we may be able to find a substitute  medication at lower cost or fill out paperwork to get insurance to cover a needed medication.   If a prior authorization is required to get your medication covered by your insurance company, please allow us 1-2 business days to complete this process.  Drug prices often vary depending on where the prescription is filled and some pharmacies may offer cheaper prices.  The website www.goodrx.com contains coupons for medications through different pharmacies. The prices here do not account for what the cost may be with help from insurance (it may be cheaper with your insurance), but the website can give you the price if you did not use any insurance.  - You can print the associated coupon and take it with your prescription to the pharmacy.  - You may also stop by our office during regular business hours and pick up a GoodRx coupon card.  - If you need your prescription sent electronically to a different pharmacy, notify our office through [place] MyChart or by phone at [phone] option 4.        Pre-Operative Instructions  You are scheduled for a surgical procedure at [facility]. We recommend you read the following instructions. If you have any questions or concerns, please call the office at [phone].  Shower and wash the entire body with soap and water the day of your surgery paying special attention to cleansing at and around the planned surgery site.  Avoid aspirin or aspirin containing products   at least fourteen (14) days prior to your surgical procedure and for at least one week (7 Days) after your surgical procedure. If you take aspirin on a regular basis for heart disease or history of stroke or for any other reason, we may recommend you continue taking aspirin but please notify us if you take this on a regular basis. Aspirin can cause more bleeding to occur during surgery as well as prolonged bleeding and bruising after surgery.   Avoid other nonsteroidal pain  medications at least one week prior to surgery and at least one week prior to your surgery. These include medications such as Ibuprofen (Motrin, Advil and Nuprin), Naprosyn, Voltaren, Relafen, etc. If medications are used for therapeutic reasons, please inform us as they can cause increased bleeding or prolonged bleeding during and bruising after surgical procedures.   Please advise us if you are taking any "blood thinner" medications such as Coumadin or Dipyridamole or Plavix or similar medications. These cause increased bleeding and prolonged bleeding during procedures and bruising after surgical procedures. We may have to consider discontinuing these medications briefly prior to and shortly after your surgery if safe to do so.   Please inform us of all medications you are currently taking. All medications that are taken regularly should be taken the day of surgery as you always do. Nevertheless, we need to be informed of what medications you are taking prior to surgery to know whether they will affect the procedure or cause any complications.   Please inform us of any medication allergies. Also inform us of whether you have allergies to Latex or rubber products or whether you have had any adverse reaction to Lidocaine or Epinephrine.  Please inform us of any prosthetic or artificial body parts such as artificial heart valve, joint replacements, etc., or similar condition that might require preoperative antibiotics.   We recommend avoidance of alcohol at least two weeks prior to surgery and continued avoidance for at least two weeks after surgery.   We recommend discontinuation of tobacco smoking at least two weeks prior to surgery and continued abstinence for at least two weeks after surgery.  Do not plan strenuous exercise, strenuous work or strenuous lifting for approximately four weeks after your surgery.   We request if you are unable to make your scheduled surgical appointment, please call us  at least a week in advance or as soon as you are aware of a problem so that we can cancel or reschedule the appointment.   You MAY TAKE TYLENOL (acetaminophen) for pain as it is not a blood thinner.   PLEASE PLAN TO BE IN TOWN FOR TWO WEEKS FOLLOWING SURGERY, THIS IS IMPORTANT SO YOU CAN BE CHECKED FOR DRESSING CHANGES, SUTURE REMOVAL AND TO MONITOR FOR POSSIBLE COMPLICATIONS.  

## 2021-08-24 ENCOUNTER — Encounter: Payer: Self-pay | Admitting: Dermatology

## 2021-09-21 ENCOUNTER — Other Ambulatory Visit: Payer: Self-pay | Admitting: Internal Medicine

## 2021-10-03 ENCOUNTER — Ambulatory Visit (INDEPENDENT_AMBULATORY_CARE_PROVIDER_SITE_OTHER): Payer: Medicare HMO | Admitting: *Deleted

## 2021-10-03 ENCOUNTER — Other Ambulatory Visit: Payer: Self-pay

## 2021-10-03 DIAGNOSIS — Z0000 Encounter for general adult medical examination without abnormal findings: Secondary | ICD-10-CM

## 2021-10-04 ENCOUNTER — Ambulatory Visit: Payer: Medicare HMO | Admitting: Internal Medicine

## 2021-10-04 NOTE — Progress Notes (Signed)
Subjective:   Mathew Martinez is a 87 y.o. male who presents for Medicare Annual/Subsequent preventive examination.  I discussed the limitations of evaluation and management by telemedicine and the availability of in person appointments. Patient expressed understanding and agreed to proceed.   Visit performed using audio  Patient:home Provider:home   Review of Systems   defer to  provider   Cardiac Risk Factors include: advanced age (>81men, >66 women);male gender     Objective:    There were no vitals filed for this visit. There is no height or weight on file to calculate BMI.  Advanced Directives 10/04/2021 07/03/2021 06/08/2021 10/17/2020 04/27/2020 04/06/2020 01/21/2020  Does Patient Have a Medical Advance Directive? No Yes No Yes Yes Yes Yes  Type of Advance Directive - Living will;Healthcare Power of [place];Living will Living will;Healthcare Power of [place];Living will Living will;Healthcare Power of Attorney  Does patient want to make changes to medical advance directive? - - - - - No - Patient declined -  Copy of [place] in Chart? - - - - - No - copy requested -  Would patient like information on creating a medical advance directive? No - Patient declined - No - Patient declined - - - -    Current Medications (verified) Outpatient Encounter Medications as of 10/03/2021  Medication Sig   Calcium Carbonate-Vit D-Min (CALCIUM 1200 PO) Take 600 mg by mouth.   Cholecalciferol 25 MCG (1000 UT) tablet Take 1,000 Units by mouth daily.   co-enzyme Q-10 30 MG capsule Take 30 mg by mouth daily.   doxazosin (CARDURA) 4 MG tablet Take 1 tablet (4 mg total) by mouth daily.   ferrous sulfate 325 (65 FE) MG tablet Take 65 mg by mouth daily with breakfast.    ketoconazole (NIZORAL) 2 % cream Apply to the feet QHS   loratadine (CLARITIN) 10 MG tablet Take 1 tablet (10 mg total) by mouth daily.   lovastatin  (MEVACOR) 20 MG tablet Take 1 tablet (20 mg total) by mouth at bedtime.   metoprolol succinate (TOPROL-XL) 25 MG 24 hr tablet Take 1 tablet (25 mg total) by mouth daily.   mirabegron ER (MYRBETRIQ) 25 MG TB24 tablet Take 1 tablet (25 mg total) by mouth daily.   Multiple Vitamins-Minerals (MULTIVITAMIN WITH MINERALS) tablet Take 1 tablet by mouth daily.   Omega-3 Fatty Acids (FISH OIL) 500 MG CAPS Take 500 mg by mouth.   TURMERIC CURCUMIN PO Take 550 mg by mouth.   vitamin B-12 (CYANOCOBALAMIN) 1000 MCG tablet Take 1 tablet (1,000 mcg total) by mouth daily.   vitamin C (ASCORBIC ACID) 500 MG tablet Take 500 mg by mouth daily.   No facility-administered encounter medications on file as of 10/03/2021.    Allergies (verified) Lisinopril   History: Past Medical History:  Diagnosis Date   Arthritis    wrist   Basal cell carcinoma 02/25/2013   Left mastoid area. Excised, margins free.   Basal cell carcinoma 03/04/2013   Left posterior shoulder. Excised, margins free.   BPH (benign prostatic hyperplasia)    Dysplastic nevus 09/14/2013   Right upper back post. shoulder. Moderate to severe atypia, close to margin.   Dysplastic nevus 09/14/2013   Right sup. popliteal. Moderate atypia, lateral and deep margins involved.   Dysplastic nevus 09/14/2013   Right proximal med. calf. Moderate atypia, deep margin involved.    Dysplastic nevus 09/14/2013   Right distal med. calf. Moderate  atypia, lateral and deep margins involved.    Hard of hearing    Heart murmur    History of basal cell carcinoma 02/18/2013   Right posterior wrist. Excised, margins free.   History of melanoma 01/14/2013   left upper back   Hyperlipemia    Hypertension    Prostate cancer ([place]) 12/29/2019   Past Surgical History:  Procedure Laterality Date   APPENDECTOMY     CATARACT EXTRACTION W/PHACO Right 03/19/2018   Procedure: CATARACT EXTRACTION PHACO AND INTRAOCULAR LENS PLACEMENT ([place]) COMPLICATED RIGHT;  Surgeon:  Leandrew Koyanagi, MD;  Location: [facility];  Service: Ophthalmology;  Laterality: Right;  MALYUGIN   CHOLECYSTECTOMY     COLONOSCOPY WITH PROPOFOL N/A 07/15/2015   Procedure: COLONOSCOPY WITH PROPOFOL;  Surgeon: Josefine Class, MD;  Location: [facility] ENDOSCOPY;  Service: Endoscopy;  Laterality: N/A;   Duodenojejunostomy     HERNIA REPAIR     prostate tissue removal  1996   Family History  Problem Relation Age of Onset   Heart disease Mother    Diabetes Mother    Cancer Father    Prostate cancer Brother    Social History   Socioeconomic History   Marital status: Married    Spouse name: Not on file   Number of children: Not on file   Years of education: Not on file   Highest education level: Not on file  Occupational History   Occupation: retired  Tobacco Use   Smoking status: Former    Packs/day: 0.50    Years: 50.00    Pack years: 25.00    Types: Cigarettes    Quit date: 12/29/1999    Years since quitting: 21.7   Smokeless tobacco: Never  Vaping Use   Vaping Use: Never used  Substance and Sexual Activity   Alcohol use: Never   Drug use: Never   Sexual activity: Not on file  Other Topics Concern   Not on file  Social History Narrative   Not on file   Social Determinants of Health   Financial Resource Strain: Low Risk    Difficulty of Paying Living Expenses: Not hard at all  Food Insecurity: No Food Insecurity   Worried About Charity fundraiser in the Last Year: Never true   [place] in the Last Year: Never true  Transportation Needs: No Transportation Needs   Lack of Transportation (Medical): No   Lack of Transportation (Non-Medical): No  Physical Activity: Sufficiently Active   Days of Exercise per Week: 7 days   Minutes of Exercise per Session: 30 min  Stress: No Stress Concern Present   Feeling of Stress : Not at all  Social Connections: Moderately Integrated   Frequency of Communication with Friends and Family: More than three times  a week   Frequency of Social Gatherings with Friends and Family: More than three times a week   Attends Religious Services: Never   Marine scientist or Organizations: No   Attends Music therapist: More than 4 times per year   Marital Status: Married    Tobacco Counseling Counseling given: Not Answered   Clinical Intake:  Pre-visit preparation completed: No  Pain : No/denies pain     Nutritional Risks: None Diabetes: No  How often do you need to have someone help you when you read instructions, pamphlets, or other written materials from your doctor or pharmacy?: 1 - Never What is the last grade level you completed in school?: college  Diabetic?no  Interpreter Needed?: No  Information entered by :: Lacretia Nicks, [place]   Activities of Daily Living In your present state of health, do you have any difficulty performing the following activities: 10/04/2021  Hearing? Y  Vision? N  Difficulty concentrating or making decisions? N  Walking or climbing stairs? N  Dressing or bathing? N  Doing errands, shopping? N  Preparing Food and eating ? N  Using the Toilet? N  In the past six months, have you accidently leaked urine? N  Do you have problems with loss of bowel control? N  Managing your Medications? N  Managing your Finances? N  Housekeeping or managing your Housekeeping? N  Some recent data might be hidden    Patient Care Team: Cletis Athens, MD as PCP - General (Internal Medicine) Noreene Filbert, MD as Radiation Oncologist (Radiation Oncology)  Indicate any recent Medical Services you may have received from other than [facility] providers in the past year (date may be approximate).     Assessment:   This is a routine wellness examination for [place].  Hearing/Vision screen No results found.  Dietary issues and exercise activities discussed: Current Exercise Habits: Home exercise routine, Type of exercise: walking;stretching, Time (Minutes):  30, Frequency (Times/Week): 7, Weekly Exercise (Minutes/Week): 210, Intensity: Mild, Exercise limited by: None identified   Goals Addressed   None    Depression Screen PHQ 2/9 Scores 10/04/2021 07/05/2021 06/30/2020  PHQ - 2 Score 0 0 0    Fall Risk Fall Risk  10/04/2021 07/05/2021 06/30/2020  Falls in the past year? 0 0 0  Number falls in past yr: 0 0 0  Injury with Fall? 0 0 0  Risk for fall due to : No Fall Risks No Fall Risks -  Follow up Falls evaluation completed Falls evaluation completed -    FALL RISK PREVENTION PERTAINING TO THE HOME:  Any stairs in or around the home? No  If so, are there any without handrails? No  Home free of loose throw rugs in walkways, pet beds, electrical cords, etc? Yes  Adequate lighting in your home to reduce risk of falls? Yes   ASSISTIVE DEVICES UTILIZED TO PREVENT FALLS:  Life alert? No  Use of a cane, walker or w/c? No  Grab bars in the bathroom? No  Shower chair or bench in shower? No  Elevated toilet seat or a handicapped toilet? No   TIMED UP AND GO:  Was the test performed? No .  Length of time to ambulate-na   Cognitive Function: MMSE - Mini Mental State Exam 10/04/2021  Not completed: Unable to complete     6CIT Screen 10/04/2021  What Year? 0 points  What month? 0 points  What time? 0 points  Count back from 20 0 points  Months in reverse 0 points  Repeat phrase 0 points  Total Score 0    Immunizations Immunization History  Administered Date(s) Administered   Influenza, High Dose Seasonal PF 07/30/2017   PFIZER(Purple Top)SARS-COV-2 Vaccination 10/28/2019, 11/21/2019, 08/02/2020, 03/14/2021    TDAP status: Due, Education has been provided regarding the importance of this vaccine. Advised may receive this vaccine at local pharmacy or Health Dept. Aware to provide a copy of the vaccination record if obtained from local pharmacy or Health Dept. Verbalized acceptance and understanding.  Flu Vaccine status: Up to  date  Pneumococcal vaccine status: Due, Education has been provided regarding the importance of this vaccine. Advised may receive this vaccine at local pharmacy or Health Dept.  Aware to provide a copy of the vaccination record if obtained from local pharmacy or Health Dept. Verbalized acceptance and understanding.  Covid-19 vaccine status: Information provided on how to obtain vaccines.   Qualifies for Shingles Vaccine? Yes   Zostavax completed No   Shingrix Completed?: No.    Education has been provided regarding the importance of this vaccine. Patient has been advised to call insurance company to determine out of pocket expense if they have not yet received this vaccine. Advised may also receive vaccine at local pharmacy or Health Dept. Verbalized acceptance and understanding.  Screening Tests Health Maintenance  Topic Date Due   Pneumonia Vaccine 55+ Years old (1 - PCV) Never done   TETANUS/TDAP  Never done   Zoster Vaccines- Shingrix (1 of 2) Never done   COVID-19 Vaccine (5 - Booster for Pfizer series) 05/09/2021   INFLUENZA VACCINE  07/20/2023 (Originally 05/22/2021)   HPV VACCINES  Aged Out    Health Maintenance  Health Maintenance Due  Topic Date Due   Pneumonia Vaccine 24+ Years old (1 - PCV) Never done   TETANUS/TDAP  Never done   Zoster Vaccines- Shingrix (1 of 2) Never done   COVID-19 Vaccine (5 - Booster for Pfizer series) 05/09/2021    Colorectal cancer screening: No longer required.   Lung Cancer Screening: (Low Dose CT Chest recommended if Age 33-80 years, 30 pack-year currently smoking OR have quit w/in 15years.) does not qualify.   Lung Cancer Screening Referral: NA  Additional Screening:  Hepatitis C Screening: does not qualify  Vision Screening: Recommended annual ophthalmology exams for early detection of glaucoma and other disorders of the eye. Is the patient up to date with their annual eye exam?  Yes     Dental Screening: Recommended annual dental  exams for proper oral hygiene  Community Resource Referral / Chronic Care Management: CRR required this visit?  No   CCM required this visit?  No      Plan:     I have personally reviewed and noted the following in the patients chart:   Medical and social history Use of alcohol, tobacco or illicit drugs  Current medications and supplements including opioid prescriptions. Patient is not currently taking opioid prescriptions. Functional ability and status Nutritional status Physical activity Advanced directives List of other physicians Hospitalizations, surgeries, and ER visits in previous 12 months Vitals Screenings to include cognitive, depression, and falls Referrals and appointments  In addition, I have reviewed and discussed with patient certain preventive protocols, quality metrics, and best practice recommendations. A written personalized care plan for preventive services as well as general preventive health recommendations were provided to patient.     Lacretia Nicks, [place]   10/04/2021   Nurse Notes:  Current Outpatient Medications on File Prior to Visit  Medication Sig Dispense Refill   Calcium Carbonate-Vit D-Min (CALCIUM 1200 PO) Take 600 mg by mouth.     Cholecalciferol 25 MCG (1000 UT) tablet Take 1,000 Units by mouth daily.     co-enzyme Q-10 30 MG capsule Take 30 mg by mouth daily.     doxazosin (CARDURA) 4 MG tablet Take 1 tablet (4 mg total) by mouth daily. 30 tablet 0   ferrous sulfate 325 (65 FE) MG tablet Take 65 mg by mouth daily with breakfast.      ketoconazole (NIZORAL) 2 % cream Apply to the feet QHS 60 g 3   loratadine (CLARITIN) 10 MG tablet Take 1 tablet (10 mg total) by mouth daily.  30 tablet 11   lovastatin (MEVACOR) 20 MG tablet Take 1 tablet (20 mg total) by mouth at bedtime. 90 tablet 3   metoprolol succinate (TOPROL-XL) 25 MG 24 hr tablet Take 1 tablet (25 mg total) by mouth daily. 90 tablet 0   mirabegron ER (MYRBETRIQ) 25 MG TB24 tablet  Take 1 tablet (25 mg total) by mouth daily. 30 tablet 3   Multiple Vitamins-Minerals (MULTIVITAMIN WITH MINERALS) tablet Take 1 tablet by mouth daily.     Omega-3 Fatty Acids (FISH OIL) 500 MG CAPS Take 500 mg by mouth.     TURMERIC CURCUMIN PO Take 550 mg by mouth.     vitamin B-12 (CYANOCOBALAMIN) 1000 MCG tablet Take 1 tablet (1,000 mcg total) by mouth daily. 90 tablet 1   vitamin C (ASCORBIC ACID) 500 MG tablet Take 500 mg by mouth daily.     No current facility-administered medications on file prior to visit.      Time spent 35 min

## 2021-10-05 NOTE — Progress Notes (Signed)
I have reviewed this visit and agree with the documentation.   

## 2021-10-16 ENCOUNTER — Other Ambulatory Visit: Payer: Self-pay | Admitting: Internal Medicine

## 2021-10-24 ENCOUNTER — Other Ambulatory Visit: Payer: Self-pay | Admitting: *Deleted

## 2021-10-24 ENCOUNTER — Other Ambulatory Visit: Payer: Self-pay | Admitting: Internal Medicine

## 2021-10-24 MED ORDER — METOPROLOL SUCCINATE ER 25 MG PO TB24: 25.0000 mg | ORAL_TABLET | Freq: Every day | ORAL | 0 refills | Status: DC

## 2021-11-02 ENCOUNTER — Inpatient Hospital Stay: Payer: Medicare HMO | Attending: Radiation Oncology

## 2021-11-02 ENCOUNTER — Other Ambulatory Visit: Payer: Self-pay

## 2021-11-02 DIAGNOSIS — Z5111 Encounter for antineoplastic chemotherapy: Secondary | ICD-10-CM | POA: Insufficient documentation

## 2021-11-02 DIAGNOSIS — C61 Malignant neoplasm of prostate: Secondary | ICD-10-CM | POA: Insufficient documentation

## 2021-11-02 LAB — COMPREHENSIVE METABOLIC PANEL
ALT: 20 U/L (ref 0–44)
AST: 22 U/L (ref 15–41)
Albumin: 3.9 g/dL (ref 3.5–5.0)
Alkaline Phosphatase: 70 U/L (ref 38–126)
Anion gap: 7 (ref 5–15)
BUN: 16 mg/dL (ref 8–23)
CO2: 25 mmol/L (ref 22–32)
Calcium: 8.6 mg/dL — ABNORMAL LOW (ref 8.9–10.3)
Chloride: 104 mmol/L (ref 98–111)
Creatinine, Ser: 0.81 mg/dL (ref 0.61–1.24)
GFR, Estimated: >60 mL/min (ref >60)
Glucose, Bld: 141 mg/dL — ABNORMAL HIGH (ref 70–99)
Potassium: 3.7 mmol/L (ref 3.5–5.1)
Sodium: 136 mmol/L (ref 135–145)
Total Bilirubin: 1 mg/dL (ref 0.3–1.2)
Total Protein: 6.6 g/dL (ref 6.5–8.1)

## 2021-11-02 LAB — CBC WITH DIFFERENTIAL/PLATELET
Abs Immature Granulocytes: 0.02 10*3/uL (ref 0.00–0.07)
Basophils Absolute: 0 10*3/uL (ref 0.0–0.1)
Basophils Relative: 0 %
Eosinophils Absolute: 0.1 10*3/uL (ref 0.0–0.5)
Eosinophils Relative: 1 %
HCT: 34.2 % — ABNORMAL LOW (ref 39.0–52.0)
Hemoglobin: 11.8 g/dL — ABNORMAL LOW (ref 13.0–17.0)
Immature Granulocytes: 0 %
Lymphocytes Relative: 24 %
Lymphs Abs: 1.3 10*3/uL (ref 0.7–4.0)
MCH: 33.2 pg (ref 26.0–34.0)
MCHC: 34.5 g/dL (ref 30.0–36.0)
MCV: 96.3 fL (ref 80.0–100.0)
Monocytes Absolute: 0.3 10*3/uL (ref 0.1–1.0)
Monocytes Relative: 6 %
Neutro Abs: 3.7 10*3/uL (ref 1.7–7.7)
Neutrophils Relative %: 69 %
Platelets: 204 10*3/uL (ref 150–400)
RBC: 3.55 MIL/uL — ABNORMAL LOW (ref 4.22–5.81)
RDW: 13 % (ref 11.5–15.5)
WBC: 5.5 10*3/uL (ref 4.0–10.5)
nRBC: 0 % (ref 0.0–0.2)

## 2021-11-02 LAB — PSA: Prostatic Specific Antigen: <0.01 ng/mL (ref 0.00–4.00)

## 2021-11-03 ENCOUNTER — Inpatient Hospital Stay: Payer: Medicare HMO

## 2021-11-03 ENCOUNTER — Encounter: Payer: Self-pay | Admitting: Oncology

## 2021-11-03 ENCOUNTER — Inpatient Hospital Stay: Payer: Medicare HMO | Admitting: Oncology

## 2021-11-03 VITALS — BP 119/71 | HR 72 | Temp 96.0°F | Wt 145.0 lb

## 2021-11-03 DIAGNOSIS — Z5111 Encounter for antineoplastic chemotherapy: Secondary | ICD-10-CM | POA: Diagnosis not present

## 2021-11-03 DIAGNOSIS — C61 Malignant neoplasm of prostate: Secondary | ICD-10-CM

## 2021-11-03 DIAGNOSIS — E538 Deficiency of other specified B group vitamins: Secondary | ICD-10-CM

## 2021-11-03 DIAGNOSIS — D649 Anemia, unspecified: Secondary | ICD-10-CM

## 2021-11-03 MED ORDER — LEUPROLIDE ACETATE (4 MONTH) 30 MG ~~LOC~~ KIT
30.0000 mg | PACK | Freq: Once | SUBCUTANEOUS | Status: AC
  Administered 2021-11-03: 30 mg via SUBCUTANEOUS
  Filled 2021-11-03: qty 30

## 2021-11-03 NOTE — Progress Notes (Signed)
Hematology/Oncology follow up note [phone] [phone]   Patient Care Team: Cletis Athens, MD as PCP - General (Internal Medicine) Noreene Filbert, MD as Radiation Oncologist (Radiation Oncology)  REFERRING PROVIDER: Cletis Athens, MD  CHIEF COMPLAINTS/REASON FOR VISIT:  Follow-up for prostate cancer  HISTORY OF PRESENTING ILLNESS:   Mathew Martinez is a  88 y.o.  male with PMH listed below was seen in consultation at the request of  Cletis Athens, MD  for evaluation of prostate cancer Patient was seen by urologist Dr. Bernardo Heater in February for evaluation of elevated PSA.  PSA drawn in November 2020 was elevated at 11.4.  No previous PSA results available.  Per patient, PSA typically fluctuates between 4 and 7 in the past. Also reports remote prostate biopsy and prostate surgery many years ago.  Patient denies any lower urinary tract symptoms.  A repeat PSA was drawn at Dr. Clydene Laming office and that level increased to 15. 12/15/2019, patient underwent prostate biopsy Pathology report was scanned in epic. Left prostate biopsies showed benign pathology. Right base, right mid, right lateral base, right lateral mid, right lateral apex biopsies were positive for adenocarcinoma. 4 out of the 5 biopsies have Gleason score 7 (4+3), right apex has Gleason score 8 (4+4).  Patient was recommended by urologist to complete staging images.  Patient prefers to be referred to cancer center for counseling and management.  Today patient was accompanied by his wife.  He reports feeling well at baseline.  No urinary symptoms. Denies any fever, chills, unintentional weight loss, abdominal pain, bone pain. He is quite active at baseline.  He does push-up exercise.  -01/12/2020 CT chest abdomen pelvis  1.8 cm right lower lobe pulmonary nodule, metastatic lung disease versus primary lung cancer.  Otherwise no potential findings of metastatic disease in the chest abdomen or pelvis.   Patient has three-vessel coronary office marked diffuse colonic diverticulosis.  Emphysema - PET Rectal normal lung nodule has very low FDG activity on PET scan, likely not metastatic prostate cancer. Low-grade primary lung cancer-ie, adenocarcinoma cannot be ruled out.  Patient has history of tobacco use   Patient has life expectancy> 5 years,I recommend radiation plus androgen deprivation therapy for 1-3 years.  Rationale and potential side effects of androgen deprivation therapy were discussed with patient.  #  2 doses of Firmagon with last dose of Firmagon on 02/18/2020. # switched to Eligard on 03/17/2020.  # For his 1.8 cm right lower lobe lung nodule, CT shows stable size.  Radonc recommend to continue observation rather than SBRT now.   INTERVAL HISTORY Mathew Martinez is a 88 y.o. male who has above history reviewed by me today presents for follow up visit for management of prostate cancer Reports feeling well. No new complaints.  Manageable side effect from ADT>     Review of Systems  Constitutional:  Negative for appetite change, chills, fatigue, fever and unexpected weight change.  HENT:   Negative for hearing loss and voice change.   Eyes:  Negative for eye problems and icterus.  Respiratory:  Negative for chest tightness, cough and shortness of breath.   Cardiovascular:  Negative for chest pain and leg swelling.  Gastrointestinal:  Negative for abdominal distention and abdominal pain.  Endocrine: Negative for hot flashes.  Genitourinary:  Negative for difficulty urinating, dysuria and frequency.        Urinary incontinence  Musculoskeletal:  Negative for arthralgias.  Skin:  Negative for itching and rash.  Neurological:  Negative for  light-headedness and numbness.  Hematological:  Negative for adenopathy. Does not bruise/bleed easily.  Psychiatric/Behavioral:  Negative for confusion.    MEDICAL HISTORY:  Past Medical History:  Diagnosis Date   Arthritis    wrist    Basal cell carcinoma 02/25/2013   Left mastoid area. Excised, margins free.   Basal cell carcinoma 03/04/2013   Left posterior shoulder. Excised, margins free.   BPH (benign prostatic hyperplasia)    Dysplastic nevus 09/14/2013   Right upper back post. shoulder. Moderate to severe atypia, close to margin.   Dysplastic nevus 09/14/2013   Right sup. popliteal. Moderate atypia, lateral and deep margins involved.   Dysplastic nevus 09/14/2013   Right proximal med. calf. Moderate atypia, deep margin involved.    Dysplastic nevus 09/14/2013   Right distal med. calf. Moderate atypia, lateral and deep margins involved.    Hard of hearing    Heart murmur    History of basal cell carcinoma 02/18/2013   Right posterior wrist. Excised, margins free.   History of melanoma 01/14/2013   left upper back   Hyperlipemia    Hypertension    Prostate cancer ([place]) 12/29/2019    SURGICAL HISTORY: Past Surgical History:  Procedure Laterality Date   APPENDECTOMY     CATARACT EXTRACTION W/PHACO Right 03/19/2018   Procedure: CATARACT EXTRACTION PHACO AND INTRAOCULAR LENS PLACEMENT ([place]) COMPLICATED RIGHT;  Surgeon: Leandrew Koyanagi, MD;  Location: [facility];  Service: Ophthalmology;  Laterality: Right;  MALYUGIN   CHOLECYSTECTOMY     COLONOSCOPY WITH PROPOFOL N/A 07/15/2015   Procedure: COLONOSCOPY WITH PROPOFOL;  Surgeon: Josefine Class, MD;  Location[facility] ENDOSCOPY;  Service: Endoscopy;  Laterality: N/A;   Duodenojejunostomy     HERNIA REPAIR     prostate tissue removal  1996    SOCIAL HISTORY: Social History   Socioeconomic History   Marital status: Married    Spouse name: Not on file   Number of children: Not on file   Years of education: Not on file   Highest education level: Not on file  Occupational History   Occupation: retired  Tobacco Use   Smoking status: Former    Packs/day: 0.50    Years: 50.00    Pack years: 25.00    Types: Cigarettes    Quit date:  12/29/1999    Years since quitting: 21.8   Smokeless tobacco: Never  Vaping Use   Vaping Use: Never used  Substance and Sexual Activity   Alcohol use: Never   Drug use: Never   Sexual activity: Not on file  Other Topics Concern   Not on file  Social History Narrative   Not on file   Social Determinants of Health   Financial Resource Strain: Low Risk    Difficulty of Paying Living Expenses: Not hard at all  Food Insecurity: No Food Insecurity   Worried About Charity fundraiser in the Last Year: Never true   [place] in the Last Year: Never true  Transportation Needs: No Transportation Needs   Lack of Transportation (Medical): No   Lack of Transportation (Non-Medical): No  Physical Activity: Sufficiently Active   Days of Exercise per Week: 7 days   Minutes of Exercise per Session: 30 min  Stress: No Stress Concern Present   Feeling of Stress : Not at all  Social Connections: Moderately Integrated   Frequency of Communication with Friends and Family: More than three times a week   Frequency of Social Gatherings with Friends  and Family: More than three times a week   Attends Religious Services: Never   Active Member of Clubs or Organizations: No   Attends Music therapist: More than 4 times per year   Marital Status: Married  Human resources officer Violence: Not At Risk   Fear of Current or Ex-Partner: No   Emotionally Abused: No   Physically Abused: No   Sexually Abused: No    FAMILY HISTORY: Family History  Problem Relation Age of Onset   Heart disease Mother    Diabetes Mother    Cancer Father    Prostate cancer Brother     ALLERGIES:  is allergic to lisinopril.  MEDICATIONS:  Current Outpatient Medications  Medication Sig Dispense Refill   Calcium Carbonate-Vit D-Min (CALCIUM 1200 PO) Take 600 mg by mouth.     Cholecalciferol 25 MCG (1000 UT) tablet Take 1,000 Units by mouth daily.     co-enzyme Q-10 30 MG capsule Take 30 mg by mouth daily.      doxazosin (CARDURA) 4 MG tablet Take 1 tablet (4 mg total) by mouth daily. 30 tablet 0   ketoconazole (NIZORAL) 2 % cream Apply to the feet QHS 60 g 3   loratadine (CLARITIN) 10 MG tablet Take 1 tablet (10 mg total) by mouth daily. 30 tablet 11   lovastatin (MEVACOR) 20 MG tablet Take 1 tablet (20 mg total) by mouth at bedtime. 90 tablet 3   metoprolol succinate (TOPROL-XL) 25 MG 24 hr tablet Take 1 tablet (25 mg total) by mouth daily. 90 tablet 0   Multiple Vitamins-Minerals (MULTIVITAMIN WITH MINERALS) tablet Take 1 tablet by mouth daily.     Omega-3 Fatty Acids (FISH OIL) 500 MG CAPS Take 500 mg by mouth.     TURMERIC CURCUMIN PO Take 550 mg by mouth.     vitamin B-12 (CYANOCOBALAMIN) 1000 MCG tablet Take 1 tablet (1,000 mcg total) by mouth daily. 90 tablet 1   mirabegron ER (MYRBETRIQ) 25 MG TB24 tablet Take 1 tablet (25 mg total) by mouth daily. 30 tablet 3   No current facility-administered medications for this visit.     PHYSICAL EXAMINATION: ECOG PERFORMANCE STATUS: 0 - Asymptomatic Vitals:   11/03/21 1213  BP: 119/71  Pulse: 72  Temp: (!) 96 F (35.6 C)   Filed Weights   11/03/21 1213  Weight: 145 lb (65.8 kg)    Physical Exam Constitutional:      General: He is not in acute distress. HENT:     Head: Normocephalic and atraumatic.  Eyes:     General: No scleral icterus. Cardiovascular:     Rate and Rhythm: Normal rate and regular rhythm.     Heart sounds: Normal heart sounds.  Pulmonary:     Effort: Pulmonary effort is normal. No respiratory distress.     Breath sounds: No wheezing.  Abdominal:     General: Bowel sounds are normal. There is no distension.     Palpations: Abdomen is soft.  Musculoskeletal:        General: No deformity. Normal range of motion.     Cervical back: Normal range of motion and neck supple.  Skin:    General: Skin is warm and dry.     Findings: No erythema or rash.  Neurological:     Mental Status: He is alert and oriented to  person, place, and time. Mental status is at baseline.     Cranial Nerves: No cranial nerve deficit.     Coordination: Coordination  normal.  Psychiatric:        Mood and Affect: Mood normal.      LABORATORY DATA:  I have reviewed the data as listed Lab Results  Component Value Date   WBC 5.5 11/02/2021   HGB 11.8 (L) 11/02/2021   HCT 34.2 (L) 11/02/2021   MCV 96.3 11/02/2021   PLT 204 11/02/2021   Recent Labs    02/27/21 1057 06/29/21 1121 11/02/21 1126  NA 138 139 136  K 4.3 4.2 3.7  CL 103 106 104  CO2 25 25 25   GLUCOSE 144* 143* 141*  BUN 15 11 16   CREATININE 0.95 0.87 0.81  CALCIUM 8.7* 8.5* 8.6*  GFRNONAA >60 >60 >60  PROT 6.8 6.7 6.6  ALBUMIN 3.7 3.7 3.9  AST 21 28 22   ALT 17 24 20   ALKPHOS 65 64 70  BILITOT 1.3* 1.2 1.0    Iron/TIBC/Ferritin/ %Sat    Component Value Date/Time   IRON 71 02/28/2021 1059   TIBC 260 02/28/2021 1059   FERRITIN 413 (H) 02/28/2021 1059   IRONPCTSAT 27 02/28/2021 1059      RADIOGRAPHIC STUDIES: I have personally reviewed the radiological images as listed and agreed with the findings in the report. No results found.     ASSESSMENT & PLAN:  1. Prostate cancer ([place])   2. Normocytic anemia   3. B12 deficiency   T2bN0M0 prostate cancer was discussed. #Risk stratification Grade group 4/Gleason 8 (4+4), PSA 15,  high risk group.   Finished radiation iron recommend continue androgen deprivation therapy for 1.5 years.   Labs are reviewed and discussed with patient. Proceed with last dose of Eligard.  PSA is stably <0.01 Close monitor.  He will get PSA checked at his PCP in 6 months.   #Lung nodule, CT showed resolved nodule.   #Chronic anemia, normocytic. Hb 11.8, stable.  No signs of iron deficiency on previous iron panel. Ferritin is trending high.  I recommend him to stop iron supplementation and vitamin C.  #Vitamin B12 deficiency,continue over-the-counter B12 1000 MCG daily.  Orders Placed This Encounter   Procedures   Folate    Standing Status:   Future    Standing Expiration Date:   11/03/2022   Vitamin B12    Standing Status:   Future    Standing Expiration Date:   11/03/2022   Comprehensive metabolic panel    Standing Status:   Future    Standing Expiration Date:   11/03/2022   CBC with Differential/Platelet    Standing Status:   Future    Standing Expiration Date:   11/03/2022   PSA    Standing Status:   Future    Standing Expiration Date:   11/03/2022    All questions were answered. The patient knows to call the clinic with any problems questions or concerns.   Return of visit:  12 months.   Earlie Server, MD, PhD  11/03/2021

## 2021-11-07 ENCOUNTER — Ambulatory Visit: Payer: Medicare HMO | Admitting: Dermatology

## 2021-11-07 ENCOUNTER — Other Ambulatory Visit: Payer: Self-pay

## 2021-11-07 ENCOUNTER — Other Ambulatory Visit: Payer: Self-pay | Admitting: Dermatology

## 2021-11-07 ENCOUNTER — Encounter: Payer: Self-pay | Admitting: Dermatology

## 2021-11-07 ENCOUNTER — Telehealth: Payer: Self-pay

## 2021-11-07 DIAGNOSIS — D485 Neoplasm of uncertain behavior of skin: Secondary | ICD-10-CM

## 2021-11-07 DIAGNOSIS — D2239 Melanocytic nevi of other parts of face: Secondary | ICD-10-CM | POA: Diagnosis not present

## 2021-11-07 DIAGNOSIS — L720 Epidermal cyst: Secondary | ICD-10-CM

## 2021-11-07 MED ORDER — MUPIROCIN 2 % EX OINT
1.0000 | TOPICAL_OINTMENT | Freq: Every day | CUTANEOUS | 0 refills | Status: DC
Start: 2021-11-07 — End: 2023-06-18

## 2021-11-07 NOTE — Patient Instructions (Signed)

## 2021-11-07 NOTE — Progress Notes (Signed)
° °  Follow-Up Visit   Subjective  Mathew Martinez is a 88 y.o. male who presents for the following: Cyst (Right inf cheek lat to inf nasolabial - Excise today).  The following portions of the chart were reviewed this encounter and updated as appropriate:   Tobacco   Allergies   Meds   Problems   Med Hx   Surg Hx   Fam Hx      Review of Systems:  No other skin or systemic complaints except as noted in HPI or Assessment and Plan.  Objective  Well appearing patient in no apparent distress; mood and affect are within normal limits.  A focused examination was performed including face. Relevant physical exam findings are noted in the Assessment and Plan.  Right inf cheek lat to inf nasolabial 1.4 x 0.8 cm cystic papule   Assessment & Plan  Neoplasm of uncertain behavior of skin Right inf cheek lat to inf nasolabial  Skin excision  Lesion length (cm):  1.4 Lesion width (cm):  0.8 Margin per side (cm):  0 Total excision diameter (cm):  1.4 Informed consent: discussed and consent obtained   Timeout: patient name, date of birth, surgical site, and procedure verified   Procedure prep:  Patient was prepped and draped in usual sterile fashion Prep type:  Isopropyl alcohol and povidone-iodine Anesthesia: the lesion was anesthetized in a standard fashion   Anesthetic:  1% lidocaine w/ epinephrine 1-100,000 buffered w/ 8.4% NaHCO3 Instrument used comment:  15c Hemostasis achieved with: pressure   Hemostasis achieved with comment:  Electrocautery Outcome: patient tolerated procedure well with no complications   Post-procedure details: sterile dressing applied and wound care instructions given   Dressing type: bandage and pressure dressing (mupirocin)    Skin repair Complexity:  Complex Final length (cm):  2.5 Reason for type of repair: reduce tension to allow closure, reduce the risk of dehiscence, infection, and necrosis, reduce subcutaneous dead space and avoid a hematoma, allow  closure of the large defect, preserve normal anatomy, preserve normal anatomical and functional relationships and enhance both functionality and cosmetic results   Undermining: area extensively undermined   Undermining comment:  Undermining defect 1.2 cm Subcutaneous layers (deep stitches):  Suture size:  5-0 Suture type: Vicryl (polyglactin 910)   Subcutaneous suture technique: inverted dermal. Fine/surface layer approximation (top stitches):  Suture size:  5-0 Suture type: nylon   Stitches: simple running   Suture removal (days):  7 Hemostasis achieved with: suture and pressure Outcome: patient tolerated procedure well with no complications   Post-procedure details: sterile dressing applied and wound care instructions given   Dressing type: bandage and pressure dressing (mupirocin)    mupirocin ointment (BACTROBAN) 2 % Apply 1 application topically daily. With dressing changes  Related Procedures Anatomic Pathology Report   Return in about 1 week (around 11/14/2021) for suture removal.  I, Ashok Cordia, CMA, am acting as scribe for Sarina Ser, MD . Documentation: I have reviewed the above documentation for accuracy and completeness, and I agree with the above.  Sarina Ser, MD

## 2021-11-07 NOTE — Telephone Encounter (Signed)
Left message for patient regarding surgery/hd  

## 2021-11-11 LAB — ANATOMIC PATHOLOGY REPORT

## 2021-11-14 ENCOUNTER — Ambulatory Visit: Payer: Medicare HMO | Admitting: Dermatology

## 2021-11-14 ENCOUNTER — Other Ambulatory Visit: Payer: Self-pay

## 2021-11-14 DIAGNOSIS — D2239 Melanocytic nevi of other parts of face: Secondary | ICD-10-CM

## 2021-11-14 DIAGNOSIS — Z4802 Encounter for removal of sutures: Secondary | ICD-10-CM

## 2021-11-14 DIAGNOSIS — D229 Melanocytic nevi, unspecified: Secondary | ICD-10-CM

## 2021-11-14 NOTE — Progress Notes (Signed)
° °  Follow-Up Visit   Subjective  Mathew Martinez is a 88 y.o. male who presents for the following: Benign nevus with cyst, bx proven (R inf cheek lat to inf nasolabial, pr presents for suture removal).  The following portions of the chart were reviewed this encounter and updated as appropriate:   Tobacco   Allergies   Meds   Problems   Med Hx   Surg Hx   Fam Hx      Review of Systems:  No other skin or systemic complaints except as noted in HPI or Assessment and Plan.  Objective  Well appearing patient in no apparent distress; mood and affect are within normal limits.  A focused examination was performed including face. Relevant physical exam findings are noted in the Assessment and Plan.  Head - Anterior (Face) Healing excision site   Assessment & Plan  Nevus with cyst, status post excision right cheek Head - Anterior (Face)  Benign nevus with cyst, bx proven  Encounter for Removal of Sutures - Incision site at the right inferior cheek, lat to inferior nasolabial is clean, dry and intact - Wound cleansed, sutures removed, wound cleansed and steri strips applied.  - Discussed pathology results showing benign nevus with cyst  - Patient advised to keep steri-strips dry until they fall off. - Scars remodel for a full year. - Once steri-strips fall off, patient can apply over-the-counter silicone scar cream each night to help with scar remodeling if desired. - Patient advised to call with any concerns or if they notice any new or changing lesions.   Return for as scheduled for TBSE.  I, Othelia Pulling, RMA, am acting as scribe for Sarina Ser, MD . Documentation: I have reviewed the above documentation for accuracy and completeness, and I agree with the above.  Sarina Ser, MD

## 2021-11-14 NOTE — Patient Instructions (Signed)

## 2021-11-19 ENCOUNTER — Encounter: Payer: Self-pay | Admitting: Dermatology

## 2021-11-22 ENCOUNTER — Other Ambulatory Visit: Payer: Self-pay | Admitting: Internal Medicine

## 2021-12-13 ENCOUNTER — Other Ambulatory Visit: Payer: Self-pay

## 2021-12-13 ENCOUNTER — Ambulatory Visit
Admission: RE | Admit: 2021-12-13 | Discharge: 2021-12-13 | Disposition: A | Discharge status: Home / Self Care | Payer: Medicare HMO | Source: Ambulatory Visit | Attending: Radiation Oncology | Admitting: Radiation Oncology

## 2021-12-13 VITALS — BP 141/68 | HR 83 | Temp 97.0°F | Resp 18 | Ht 69.0 in | Wt 144.5 lb

## 2021-12-13 DIAGNOSIS — Z08 Encounter for follow-up examination after completed treatment for malignant neoplasm: Secondary | ICD-10-CM | POA: Diagnosis not present

## 2021-12-13 DIAGNOSIS — N3941 Urge incontinence: Secondary | ICD-10-CM | POA: Insufficient documentation

## 2021-12-13 DIAGNOSIS — C61 Malignant neoplasm of prostate: Secondary | ICD-10-CM | POA: Diagnosis not present

## 2021-12-13 DIAGNOSIS — Z923 Personal history of irradiation: Secondary | ICD-10-CM | POA: Insufficient documentation

## 2021-12-13 NOTE — Progress Notes (Signed)
Radiation Oncology Follow up Note  Name: Mathew Martinez   Date:   12/13/2021 MRN:  [id number] DOB: 25-Feb-1931    This 88 y.o. male presents to the clinic today for 37-month follow-up status post IMRT radiation therapy to his prostate and pelvic nodes for Gleason 8 (4+4) adenocarcinoma presenting with a PSA of 11.4..  REFERRING PROVIDER: Cletis Athens, MD  HPI: Patient is a an 88 year old male now out 20 months having completed IMRT radiation therapy to his prostate for Gleason 8 adenocarcinoma.  Seen today in routine follow-up he is doing fairly well.  He has some urge incontinence he is doing Kegel exercises.  He was on some antispasmodic medication although causing bowel symptoms and he DC'd that.  He is otherwise doing well his most recent PSA is less than 9.16.  COMPLICATIONS OF TREATMENT: none  FOLLOW UP COMPLIANCE: keeps appointments   PHYSICAL EXAM:  BP (!) 141/68    Pulse 83    Temp (!) 97 F (36.1 C)    Resp 18    Ht 5\' 9"  (1.753 m)    Wt 144 lb 8 oz (65.5 kg)    BMI 21.34 kg/m  Well-developed well-nourished patient in NAD. HEENT reveals PERLA, EOMI, discs not visualized.  Oral cavity is clear. No oral mucosal lesions are identified. Neck is clear without evidence of cervical or supraclavicular adenopathy. Lungs are clear to A&P. Cardiac examination is essentially unremarkable with regular rate and rhythm without murmur rub or thrill. Abdomen is benign with no organomegaly or masses noted. Motor sensory and DTR levels are equal and symmetric in the upper and lower extremities. Cranial nerves II through XII are grossly intact. Proprioception is intact. No peripheral adenopathy or edema is identified. No motor or sensory levels are noted. Crude visual fields are within normal range.  RADIOLOGY RESULTS: No current films for review  PLAN: Present time patient is doing well under excellent biochemical control of his prostate cancer.  He continues follow-up care with urology.  He states  his urgent continence seems to be improving over time.  I have asked to see him back in 1 year for follow-up.  Patient is to call with any concerns.  I would like to take this opportunity to thank you for allowing me to participate in the care of your patient.Noreene Filbert, MD

## 2021-12-26 ENCOUNTER — Other Ambulatory Visit: Payer: Self-pay | Admitting: Internal Medicine

## 2021-12-26 ENCOUNTER — Other Ambulatory Visit: Payer: Self-pay | Admitting: *Deleted

## 2021-12-26 MED ORDER — DOXAZOSIN MESYLATE 4 MG PO TABS: 4.0000 mg | ORAL_TABLET | Freq: Every day | ORAL | 1 refills | Status: DC

## 2022-01-01 DIAGNOSIS — Z0100 Encounter for examination of eyes and vision without abnormal findings: Secondary | ICD-10-CM | POA: Diagnosis not present

## 2022-01-01 DIAGNOSIS — Z961 Presence of intraocular lens: Secondary | ICD-10-CM | POA: Diagnosis not present

## 2022-01-22 ENCOUNTER — Encounter: Payer: Self-pay | Admitting: Internal Medicine

## 2022-01-22 ENCOUNTER — Ambulatory Visit (INDEPENDENT_AMBULATORY_CARE_PROVIDER_SITE_OTHER): Payer: Medicare HMO | Admitting: Internal Medicine

## 2022-01-22 VITALS — BP 103/65 | HR 68 | Ht 69.0 in | Wt 144.2 lb

## 2022-01-22 DIAGNOSIS — J301 Allergic rhinitis due to pollen: Secondary | ICD-10-CM

## 2022-01-22 DIAGNOSIS — I10 Essential (primary) hypertension: Secondary | ICD-10-CM | POA: Diagnosis not present

## 2022-01-22 DIAGNOSIS — S43421A Sprain of right rotator cuff capsule, initial encounter: Secondary | ICD-10-CM

## 2022-01-22 DIAGNOSIS — Z79818 Long term (current) use of other agents affecting estrogen receptors and estrogen levels: Secondary | ICD-10-CM

## 2022-01-22 DIAGNOSIS — C61 Malignant neoplasm of prostate: Secondary | ICD-10-CM

## 2022-01-22 NOTE — Assessment & Plan Note (Signed)
We will follow-up with PSA in 6 weeksweek ?

## 2022-01-22 NOTE — Assessment & Plan Note (Signed)
Take Claritin 5 mg p.o. daily 

## 2022-01-22 NOTE — Assessment & Plan Note (Signed)
Stable at the present time. 

## 2022-01-22 NOTE — Assessment & Plan Note (Signed)

## 2022-01-22 NOTE — Progress Notes (Signed)
? ?Established Patient Office Visit ? ?Subjective:  ?Patient ID: Mathew Martinez, male    DOB: September 14, 1931  Age: 88 y.o. MRN: [id number] ? ?CC:  ?Chief Complaint  ?Patient presents with  ? Hypertension  ? ? ?Hypertension ? ? ?Mathew Martinez presents for check up ,pt has ca of prostate ? ?Past Medical History:  ?Diagnosis Date  ? Arthritis   ? wrist  ? Basal cell carcinoma 02/25/2013  ? Left mastoid area. Excised, margins free.  ? Basal cell carcinoma 03/04/2013  ? Left posterior shoulder. Excised, margins free.  ? BPH (benign prostatic hyperplasia)   ? Dysplastic nevus 09/14/2013  ? Right upper back post. shoulder. Moderate to severe atypia, close to margin.  ? Dysplastic nevus 09/14/2013  ? Right sup. popliteal. Moderate atypia, lateral and deep margins involved.  ? Dysplastic nevus 09/14/2013  ? Right proximal med. calf. Moderate atypia, deep margin involved.   ? Dysplastic nevus 09/14/2013  ? Right distal med. calf. Moderate atypia, lateral and deep margins involved.   ? Hard of hearing   ? Heart murmur   ? History of basal cell carcinoma 02/18/2013  ? Right posterior wrist. Excised, margins free.  ? History of melanoma 01/14/2013  ? left upper back  ? Hyperlipemia   ? Hypertension   ? Prostate cancer ([place]) 12/29/2019  ? ? ?Past Surgical History:  ?Procedure Laterality Date  ? APPENDECTOMY    ? CATARACT EXTRACTION W/PHACO Right 03/19/2018  ? Procedure: CATARACT EXTRACTION PHACO AND INTRAOCULAR LENS PLACEMENT ([place]) COMPLICATED RIGHT;  Surgeon: Leandrew Koyanagi, MD;  Location: [facility];  Service: Ophthalmology;  Laterality: Right;  MALYUGIN  ? CHOLECYSTECTOMY    ? COLONOSCOPY WITH PROPOFOL N/A 07/15/2015  ? Procedure: COLONOSCOPY WITH PROPOFOL;  Surgeon: Josefine Class, MD;  Location: [facility] ENDOSCOPY;  Service: Endoscopy;  Laterality: N/A;  ? Duodenojejunostomy    ? HERNIA REPAIR    ? prostate tissue removal  1996  ? ? ?Family History  ?Problem Relation Age of Onset  ? Heart disease Mother   ?  Diabetes Mother   ? Cancer Father   ? Prostate cancer Brother   ? ? ?Social History  ? ?Socioeconomic History  ? Marital status: Married  ?  Spouse name: Not on file  ? Number of children: Not on file  ? Years of education: Not on file  ? Highest education level: Not on file  ?Occupational History  ? Occupation: retired  ?Tobacco Use  ? Smoking status: Former  ?  Packs/day: 0.50  ?  Years: 50.00  ?  Pack years: 25.00  ?  Types: Cigarettes  ?  Quit date: 12/29/1999  ?  Years since quitting: 22.0  ? Smokeless tobacco: Never  ?Vaping Use  ? Vaping Use: Never used  ?Substance and Sexual Activity  ? Alcohol use: Never  ? Drug use: Never  ? Sexual activity: Not on file  ?Other Topics Concern  ? Not on file  ?Social History Narrative  ? Not on file  ? ?Social Determinants of Health  ? ?Financial Resource Strain: Low Risk   ? Difficulty of Paying Living Expenses: Not hard at all  ?Food Insecurity: No Food Insecurity  ? Worried About Charity fundraiser in the Last Year: Never true  ? Ran Out of Food in the Last Year: Never true  ?Transportation Needs: No Transportation Needs  ? Lack of Transportation (Medical): No  ? Lack of Transportation (Non-Medical): No  ?Physical Activity: Sufficiently Active  ?  Days of Exercise per Week: 7 days  ? Minutes of Exercise per Session: 30 min  ?Stress: No Stress Concern Present  ? Feeling of Stress : Not at all  ?Social Connections: Moderately Integrated  ? Frequency of Communication with Friends and Family: More than three times a week  ? Frequency of Social Gatherings with Friends and Family: More than three times a week  ? Attends Religious Services: Never  ? Active Member of Clubs or Organizations: No  ? Attends Archivist Meetings: More than 4 times per year  ? Marital Status: Married  ?Intimate Partner Violence: Not At Risk  ? Fear of Current or Ex-Partner: No  ? Emotionally Abused: No  ? Physically Abused: No  ? Sexually Abused: No  ? ? ? ?Current Outpatient Medications:  ?   Calcium Carbonate-Vit D-Min (CALCIUM 1200 PO), Take 600 mg by mouth., Disp: , Rfl:  ?  Cholecalciferol 25 MCG (1000 UT) tablet, Take 1,000 Units by mouth daily., Disp: , Rfl:  ?  co-enzyme Q-10 30 MG capsule, Take 30 mg by mouth daily., Disp: , Rfl:  ?  doxazosin (CARDURA) 4 MG tablet, Take 1 tablet (4 mg total) by mouth daily., Disp: 90 tablet, Rfl: 1 ?  ketoconazole (NIZORAL) 2 % cream, Apply to the feet QHS, Disp: 60 g, Rfl: 3 ?  loratadine (CLARITIN) 10 MG tablet, Take 1 tablet (10 mg total) by mouth daily., Disp: 30 tablet, Rfl: 11 ?  lovastatin (MEVACOR) 20 MG tablet, Take 1 tablet (20 mg total) by mouth at bedtime., Disp: 90 tablet, Rfl: 3 ?  metoprolol succinate (TOPROL-XL) 25 MG 24 hr tablet, Take 1 tablet (25 mg total) by mouth daily., Disp: 90 tablet, Rfl: 0 ?  Multiple Vitamins-Minerals (MULTIVITAMIN WITH MINERALS) tablet, Take 1 tablet by mouth daily., Disp: , Rfl:  ?  mupirocin ointment (BACTROBAN) 2 %, Apply 1 application topically daily. With dressing changes, Disp: 22 g, Rfl: 0 ?  Omega-3 Fatty Acids (FISH OIL) 500 MG CAPS, Take 500 mg by mouth., Disp: , Rfl:  ?  TURMERIC CURCUMIN PO, Take 550 mg by mouth., Disp: , Rfl:  ?  vitamin B-12 (CYANOCOBALAMIN) 1000 MCG tablet, Take 1 tablet (1,000 mcg total) by mouth daily., Disp: 90 tablet, Rfl: 1  ? ?Allergies  ?Allergen Reactions  ? Lisinopril Other (See Comments)  ?  Face Swelling  ? ? ?ROS ?Review of Systems  ?Constitutional: Negative.   ?HENT: Negative.    ?Eyes: Negative.   ?Respiratory: Negative.    ?Cardiovascular: Negative.   ?Gastrointestinal: Negative.   ?Endocrine: Negative.   ?Genitourinary: Negative.   ?Musculoskeletal: Negative.   ?Skin: Negative.   ?Allergic/Immunologic: Negative.   ?Neurological: Negative.   ?Hematological: Negative.   ?Psychiatric/Behavioral: Negative.    ?All other systems reviewed and are negative. ? ?  ?Objective:  ?  ?Physical Exam ?Vitals reviewed.  ?Constitutional:   ?   Appearance: Normal appearance.  ?HENT:  ?    Mouth/Throat:  ?   Mouth: Mucous membranes are moist.  ?Eyes:  ?   Pupils: Pupils are equal, round, and reactive to light.  ?Neck:  ?   Vascular: No carotid bruit.  ?Cardiovascular:  ?   Rate and Rhythm: Normal rate and regular rhythm.  ?   Pulses: Normal pulses.  ?   Heart sounds: Normal heart sounds.  ?Pulmonary:  ?   Effort: Pulmonary effort is normal.  ?   Breath sounds: Normal breath sounds.  ?Abdominal:  ?   General:  Bowel sounds are normal.  ?   Palpations: Abdomen is soft. There is no hepatomegaly, splenomegaly or mass.  ?   Tenderness: There is no abdominal tenderness.  ?   Hernia: No hernia is present.  ?Musculoskeletal:  ?   Cervical back: Neck supple.  ?   Right lower leg: No edema.  ?   Left lower leg: No edema.  ?Skin: ?   Findings: No rash.  ?Neurological:  ?   Mental Status: He is alert and oriented to person, place, and time.  ?   Motor: No weakness.  ?Psychiatric:     ?   Mood and Affect: Mood normal.     ?   Behavior: Behavior normal.  ? ? ?BP 103/65   Pulse 68   Ht '5\' 9"'$  (1.753 m)   Wt 144 lb 3.2 oz (65.4 kg)   BMI 21.29 kg/m?  ?Wt Readings from Last 3 Encounters:  ?01/22/22 144 lb 3.2 oz (65.4 kg)  ?12/13/21 144 lb 8 oz (65.5 kg)  ?11/03/21 145 lb (65.8 kg)  ? ? ? ?Health Maintenance Due  ?Topic Date Due  ? TETANUS/TDAP  Never done  ? Zoster Vaccines- Shingrix (1 of 2) Never done  ? Pneumonia Vaccine 59+ Years old (1 - PCV) Never done  ? COVID-19 Vaccine (6 - Booster for Pfizer series) 10/03/2021  ? ? ?There are no preventive care reminders to display for this patient. ? ?Lab Results  ?Component Value Date  ? TSH 1.429 02/18/2020  ? ?Lab Results  ?Component Value Date  ? WBC 5.5 11/02/2021  ? HGB 11.8 (L) 11/02/2021  ? HCT 34.2 (L) 11/02/2021  ? MCV 96.3 11/02/2021  ? PLT 204 11/02/2021  ? ?Lab Results  ?Component Value Date  ? NA 136 11/02/2021  ? K 3.7 11/02/2021  ? CO2 25 11/02/2021  ? GLUCOSE 141 (H) 11/02/2021  ? BUN 16 11/02/2021  ? CREATININE 0.81 11/02/2021  ? BILITOT 1.0 11/02/2021   ? ALKPHOS 70 11/02/2021  ? AST 22 11/02/2021  ? ALT 20 11/02/2021  ? PROT 6.6 11/02/2021  ? ALBUMIN 3.9 11/02/2021  ? CALCIUM 8.6 (L) 11/02/2021  ? ANIONGAP 7 11/02/2021  ? ?No results found for: CHOL ?No re

## 2022-01-22 NOTE — Assessment & Plan Note (Signed)
It was stopped by oncologist ?

## 2022-01-24 ENCOUNTER — Other Ambulatory Visit: Payer: Self-pay | Admitting: *Deleted

## 2022-01-24 MED ORDER — DOXAZOSIN MESYLATE 4 MG PO TABS: 4.0000 mg | ORAL_TABLET | Freq: Every day | ORAL | 1 refills | Status: DC

## 2022-04-16 ENCOUNTER — Other Ambulatory Visit: Payer: Self-pay | Admitting: Internal Medicine

## 2022-04-30 ENCOUNTER — Ambulatory Visit (INDEPENDENT_AMBULATORY_CARE_PROVIDER_SITE_OTHER): Payer: Medicare HMO | Admitting: *Deleted

## 2022-04-30 DIAGNOSIS — E538 Deficiency of other specified B group vitamins: Secondary | ICD-10-CM

## 2022-04-30 DIAGNOSIS — D696 Thrombocytopenia, unspecified: Secondary | ICD-10-CM

## 2022-04-30 DIAGNOSIS — Z8546 Personal history of malignant neoplasm of prostate: Secondary | ICD-10-CM | POA: Diagnosis not present

## 2022-04-30 DIAGNOSIS — E7800 Pure hypercholesterolemia, unspecified: Secondary | ICD-10-CM | POA: Diagnosis not present

## 2022-04-30 DIAGNOSIS — I10 Essential (primary) hypertension: Secondary | ICD-10-CM

## 2022-05-01 LAB — COMPLETE METABOLIC PANEL WITH GFR
AG Ratio: 1.9 (calc) (ref 1.0–2.5)
ALT: 18 U/L (ref 9–46)
AST: 18 U/L (ref 10–35)
Albumin: 4.2 g/dL (ref 3.6–5.1)
Alkaline phosphatase (APISO): 70 U/L (ref 35–144)
BUN: 15 mg/dL (ref 7–25)
CO2: 22 mmol/L (ref 20–32)
Calcium: 9.3 mg/dL (ref 8.6–10.3)
Chloride: 107 mmol/L (ref 98–110)
Creat: 1.03 mg/dL (ref 0.70–1.22)
Globulin: 2.2 g/dL (calc) (ref 1.9–3.7)
Glucose, Bld: 103 mg/dL — ABNORMAL HIGH (ref 65–99)
Potassium: 4.7 mmol/L (ref 3.5–5.3)
Sodium: 145 mmol/L (ref 135–146)
Total Bilirubin: 1.3 mg/dL — ABNORMAL HIGH (ref 0.2–1.2)
Total Protein: 6.4 g/dL (ref 6.1–8.1)
eGFR: 69 mL/min/{1.73_m2} (ref >=60)

## 2022-05-01 LAB — B12 AND FOLATE PANEL
Folate: >24 ng/mL
Vitamin B-12: 1082 pg/mL (ref 200–1100)

## 2022-05-01 LAB — CBC WITH DIFFERENTIAL/PLATELET
Absolute Monocytes: 427 cells/uL (ref 200–950)
Basophils Absolute: 22 cells/uL (ref 0–200)
Basophils Relative: 0.4 %
Eosinophils Absolute: 119 cells/uL (ref 15–500)
Eosinophils Relative: 2.2 %
HCT: 36 % — ABNORMAL LOW (ref 38.5–50.0)
Hemoglobin: 12.2 g/dL — ABNORMAL LOW (ref 13.2–17.1)
Lymphs Abs: 1193 cells/uL (ref 850–3900)
MCH: 32.8 pg (ref 27.0–33.0)
MCHC: 33.9 g/dL (ref 32.0–36.0)
MCV: 96.8 fL (ref 80.0–100.0)
MPV: 9.9 fL (ref 7.5–12.5)
Monocytes Relative: 7.9 %
Neutro Abs: 3640 cells/uL (ref 1500–7800)
Neutrophils Relative %: 67.4 %
Platelets: 171 10*3/uL (ref 140–400)
RBC: 3.72 10*6/uL — ABNORMAL LOW (ref 4.20–5.80)
RDW: 12.1 % (ref 11.0–15.0)
Total Lymphocyte: 22.1 %
WBC: 5.4 10*3/uL (ref 3.8–10.8)

## 2022-05-01 LAB — LIPID PANEL
Cholesterol: 136 mg/dL (ref <200)
HDL: 45 mg/dL (ref >=40)
LDL Cholesterol (Calc): 71 mg/dL (calc)
Non-HDL Cholesterol (Calc): 91 mg/dL (calc) (ref <130)
Total CHOL/HDL Ratio: 3 (calc) (ref <5.0)
Triglycerides: 123 mg/dL (ref <150)

## 2022-05-01 LAB — SPECIMEN COMPROMISED

## 2022-05-01 LAB — PSA: PSA: <0.04 ng/mL (ref <=4.00)

## 2022-05-01 LAB — TSH: TSH: 2.9 mIU/L (ref 0.40–4.50)

## 2022-05-07 ENCOUNTER — Ambulatory Visit (INDEPENDENT_AMBULATORY_CARE_PROVIDER_SITE_OTHER): Payer: Medicare HMO | Admitting: Internal Medicine

## 2022-05-07 ENCOUNTER — Encounter: Payer: Self-pay | Admitting: Internal Medicine

## 2022-05-07 VITALS — BP 130/67 | HR 77 | Ht 69.0 in | Wt 143.2 lb

## 2022-05-07 DIAGNOSIS — C61 Malignant neoplasm of prostate: Secondary | ICD-10-CM | POA: Diagnosis not present

## 2022-05-07 DIAGNOSIS — D696 Thrombocytopenia, unspecified: Secondary | ICD-10-CM

## 2022-05-07 DIAGNOSIS — J301 Allergic rhinitis due to pollen: Secondary | ICD-10-CM

## 2022-05-07 DIAGNOSIS — I10 Essential (primary) hypertension: Secondary | ICD-10-CM

## 2022-05-07 DIAGNOSIS — S43421A Sprain of right rotator cuff capsule, initial encounter: Secondary | ICD-10-CM

## 2022-05-07 NOTE — Assessment & Plan Note (Signed)

## 2022-05-07 NOTE — Assessment & Plan Note (Signed)
Take Claritin 5 mg p.o. daily 

## 2022-05-07 NOTE — Assessment & Plan Note (Signed)
Patient is being followed up by his urologist

## 2022-05-07 NOTE — Progress Notes (Signed)
Established Patient Office Visit  Subjective:  Patient ID: Mathew Martinez, male    DOB: [birth year]  Age: 88 y.o. MRN: [id number]  CC:  Chief Complaint  Patient presents with   lab results    HPI  Mathew Martinez presents for check up  Past Medical History:  Diagnosis Date   Arthritis    wrist   Basal cell carcinoma 02/25/2013   Left mastoid area. Excised, margins free.   Basal cell carcinoma 03/04/2013   Left posterior shoulder. Excised, margins free.   BPH (benign prostatic hyperplasia)    Dysplastic nevus 09/14/2013   Right upper back post. shoulder. Moderate to severe atypia, close to margin.   Dysplastic nevus 09/14/2013   Right sup. popliteal. Moderate atypia, lateral and deep margins involved.   Dysplastic nevus 09/14/2013   Right proximal med. calf. Moderate atypia, deep margin involved.    Dysplastic nevus 09/14/2013   Right distal med. calf. Moderate atypia, lateral and deep margins involved.    Hard of hearing    Heart murmur    History of basal cell carcinoma 02/18/2013   Right posterior wrist. Excised, margins free.   History of melanoma 01/14/2013   left upper back   Hyperlipemia    Hypertension    Prostate cancer ([place]) 12/29/2019    Past Surgical History:  Procedure Laterality Date   APPENDECTOMY     CATARACT EXTRACTION W/PHACO Right 03/19/2018   Procedure: CATARACT EXTRACTION PHACO AND INTRAOCULAR LENS PLACEMENT ([place]) COMPLICATED RIGHT;  Surgeon: Leandrew Koyanagi, MD;  Location: [place];  Service: Ophthalmology;  Laterality: Right;  MALYUGIN   CHOLECYSTECTOMY     COLONOSCOPY WITH PROPOFOL N/A 07/15/2015   Procedure: COLONOSCOPY WITH PROPOFOL;  Surgeon: Josefine Class, MD;  Location: [facility] ENDOSCOPY;  Service: Endoscopy;  Laterality: N/A;   Duodenojejunostomy     HERNIA REPAIR     prostate tissue removal  1996    Family History  Problem Relation Age of Onset   Heart disease Mother    Diabetes Mother    Cancer Father     Prostate cancer Brother     Social History   Socioeconomic History   Marital status: Married    Spouse name: Not on file   Number of children: Not on file   Years of education: Not on file   Highest education level: Not on file  Occupational History   Occupation: retired  Tobacco Use   Smoking status: Former    Packs/day: 0.50    Years: 50.00    Total pack years: 25.00    Types: Cigarettes    Quit date: 12/29/1999    Years since quitting: 22.3   Smokeless tobacco: Never  Vaping Use   Vaping Use: Never used  Substance and Sexual Activity   Alcohol use: Never   Drug use: Never   Sexual activity: Not on file  Other Topics Concern   Not on file  Social History Narrative   Not on file   Social Determinants of Health   Financial Resource Strain: [place]  (10/04/2021)   Overall Financial Resource Strain (CARDIA)    Difficulty of Paying Living Expenses: Not hard at all  Food Insecurity: No Food Insecurity (10/04/2021)   Hunger Vital Sign    Worried About Running Out of Food in the Last Year: Never true    [place] in the Last Year: Never true  Transportation Needs: No Transportation Needs (10/04/2021)   [place] - Transportation  Lack of Transportation (Medical): No    Lack of Transportation (Non-Medical): No  Physical Activity: Sufficiently Active (10/04/2021)   Exercise Vital Sign    Days of Exercise per Week: 7 days    Minutes of Exercise per Session: 30 min  Stress: No Stress Concern Present (10/04/2021)   [place]    Feeling of Stress : Not at all  Social Connections: Moderately Integrated (10/04/2021)   Social Connection and Isolation Panel [NHANES]    Frequency of Communication with Friends and Family: More than three times a week    Frequency of Social Gatherings with Friends and Family: More than three times a week    Attends Religious Services: Never    Marine scientist or  Organizations: No    Attends Music therapist: More than 4 times per year    Marital Status: Married  Human resources officer Violence: Not At Risk (10/04/2021)   Humiliation, Afraid, Rape, and Kick questionnaire    Fear of Current or Ex-Partner: No    Emotionally Abused: No    Physically Abused: No    Sexually Abused: No     Current Outpatient Medications:    Calcium Carbonate-Vit D-Min (CALCIUM 1200 PO), Take 600 mg by mouth., Disp: , Rfl:    Cholecalciferol 25 MCG (1000 UT) tablet, Take 1,000 Units by mouth daily., Disp: , Rfl:    co-enzyme Q-10 30 MG capsule, Take 30 mg by mouth daily., Disp: , Rfl:    doxazosin (CARDURA) 4 MG tablet, Take 1 tablet (4 mg total) by mouth daily., Disp: 90 tablet, Rfl: 1   ketoconazole (NIZORAL) 2 % cream, Apply to the feet QHS, Disp: 60 g, Rfl: 3   loratadine (CLARITIN) 10 MG tablet, Take 1 tablet (10 mg total) by mouth daily., Disp: 30 tablet, Rfl: 11   lovastatin (MEVACOR) 20 MG tablet, Take 1 tablet (20 mg total) by mouth at bedtime., Disp: 90 tablet, Rfl: 3   metoprolol succinate (TOPROL-XL) 25 MG 24 hr tablet, Take 1 tablet (25 mg total) by mouth daily., Disp: 90 tablet, Rfl: 0   Multiple Vitamins-Minerals (MULTIVITAMIN WITH MINERALS) tablet, Take 1 tablet by mouth daily., Disp: , Rfl:    mupirocin ointment (BACTROBAN) 2 %, Apply 1 application topically daily. With dressing changes, Disp: 22 g, Rfl: 0   Omega-3 Fatty Acids (FISH OIL) 500 MG CAPS, Take 500 mg by mouth., Disp: , Rfl:    TURMERIC CURCUMIN PO, Take 550 mg by mouth., Disp: , Rfl:    vitamin B-12 (CYANOCOBALAMIN) 1000 MCG tablet, Take 1 tablet (1,000 mcg total) by mouth daily., Disp: 90 tablet, Rfl: 1   Allergies  Allergen Reactions   Lisinopril Other (See Comments)    Face Swelling    ROS Review of Systems  Constitutional: Negative.   HENT: Negative.    Eyes: Negative.   Respiratory: Negative.    Cardiovascular: Negative.   Gastrointestinal: Negative.   Endocrine:  Negative.   Genitourinary: Negative.   Musculoskeletal: Negative.   Skin: Negative.   Allergic/Immunologic: Negative.   Neurological: Negative.   Hematological: Negative.   Psychiatric/Behavioral: Negative.    All other systems reviewed and are negative.     Objective:    Physical Exam Vitals reviewed.  Constitutional:      Appearance: Normal appearance.  HENT:     Mouth/Throat:     Mouth: Mucous membranes are moist.  Eyes:     Pupils: Pupils are equal, round, and  reactive to light.  Neck:     Vascular: No carotid bruit.  Cardiovascular:     Rate and Rhythm: Normal rate and regular rhythm.     Pulses: Normal pulses.     Heart sounds: Normal heart sounds.  Pulmonary:     Effort: Pulmonary effort is normal.     Breath sounds: Normal breath sounds.  Abdominal:     General: Bowel sounds are normal.     Palpations: Abdomen is soft. There is no hepatomegaly, splenomegaly or mass.     Tenderness: There is no abdominal tenderness.     Hernia: No hernia is present.  Musculoskeletal:     Cervical back: Neck supple.     Right lower leg: No edema.     Left lower leg: No edema.  Skin:    Findings: No rash.  Neurological:     Mental Status: He is alert and oriented to person, place, and time.     Motor: No weakness.  Psychiatric:        Mood and Affect: Mood normal.        Behavior: Behavior normal.     BP 130/67   Pulse 77   Ht 5' 9"  (1.753 m)   Wt 143 lb 3.2 oz (65 kg)   BMI 21.15 kg/m  Wt Readings from Last 3 Encounters:  05/07/22 143 lb 3.2 oz (65 kg)  01/22/22 144 lb 3.2 oz (65.4 kg)  12/13/21 144 lb 8 oz (65.5 kg)     Health Maintenance Due  Topic Date Due   TETANUS/TDAP  Never done   Zoster Vaccines- Shingrix (1 of 2) Never done   Pneumonia Vaccine 59+ Years old (1 - PCV) Never done   COVID-19 Vaccine (6 - Booster for Pfizer series) 10/03/2021    There are no preventive care reminders to display for this patient.  Lab Results  Component Value Date    TSH 2.90 04/30/2022   Lab Results  Component Value Date   WBC 5.4 04/30/2022   HGB 12.2 (L) 04/30/2022   HCT 36.0 (L) 04/30/2022   MCV 96.8 04/30/2022   PLT 171 04/30/2022   Lab Results  Component Value Date   NA 145 04/30/2022   K 4.7 04/30/2022   CO2 22 04/30/2022   GLUCOSE 103 (H) 04/30/2022   BUN 15 04/30/2022   CREATININE 1.03 04/30/2022   BILITOT 1.3 (H) 04/30/2022   ALKPHOS 70 11/02/2021   AST 18 04/30/2022   ALT 18 04/30/2022   PROT 6.4 04/30/2022   ALBUMIN 3.9 11/02/2021   CALCIUM 9.3 04/30/2022   ANIONGAP 7 11/02/2021   EGFR 69 04/30/2022   Lab Results  Component Value Date   CHOL 136 04/30/2022   Lab Results  Component Value Date   HDL 45 04/30/2022   Lab Results  Component Value Date   LDLCALC 71 04/30/2022   Lab Results  Component Value Date   TRIG 123 04/30/2022   Lab Results  Component Value Date   CHOLHDL 3.0 04/30/2022   No results found for: "HGBA1C"    Assessment & Plan:   Problem List Items Addressed This Visit       Cardiovascular and Mediastinum   Essential hypertension - Primary     Patient denies any chest pain or shortness of breath there is no history of palpitation or paroxysmal nocturnal dyspnea   patient was advised to follow low-salt low-cholesterol diet    ideally I want to keep systolic blood pressure below 130 mmHg,  patient was asked to check blood pressure one times a week and give me a report on that.  Patient will be follow-up in 3 months  or earlier as needed, patient will call me back for any change in the cardiovascular symptoms Patient was advised to buy a book from local bookstore concerning blood pressure and read several chapters  every day.  This will be supplemented by some of the material we will give him from the office.  Patient should also utilize other resources like YouTube and Internet to learn more about the blood pressure and the diet.        Respiratory   Seasonal allergic rhinitis due to  pollen    Take Claritin 5 mg p.o. daily        Musculoskeletal and Integument   Sprain of right rotator cuff capsule    Stable at the present time        Genitourinary   Prostate cancer [facility])    Patient is being followed up by his urologist        Hematopoietic and Hemostatic   Thrombocytopenia ([place])    Stable at the present time       No orders of the defined types were placed in this encounter.   Follow-up: No follow-ups on file.    Cletis Athens, MD

## 2022-05-07 NOTE — Assessment & Plan Note (Signed)
Stable at the present time. 

## 2022-06-06 ENCOUNTER — Other Ambulatory Visit: Payer: Self-pay | Admitting: Internal Medicine

## 2022-07-19 ENCOUNTER — Other Ambulatory Visit: Payer: Self-pay | Admitting: Internal Medicine

## 2022-08-06 ENCOUNTER — Encounter: Payer: Self-pay | Admitting: Internal Medicine

## 2022-08-06 ENCOUNTER — Ambulatory Visit (INDEPENDENT_AMBULATORY_CARE_PROVIDER_SITE_OTHER): Payer: Medicare HMO | Admitting: Internal Medicine

## 2022-08-06 VITALS — BP 111/63 | HR 84 | Ht 69.0 in | Wt 143.7 lb

## 2022-08-06 DIAGNOSIS — J301 Allergic rhinitis due to pollen: Secondary | ICD-10-CM

## 2022-08-06 DIAGNOSIS — I10 Essential (primary) hypertension: Secondary | ICD-10-CM

## 2022-08-06 DIAGNOSIS — E7800 Pure hypercholesterolemia, unspecified: Secondary | ICD-10-CM | POA: Diagnosis not present

## 2022-08-06 DIAGNOSIS — C61 Malignant neoplasm of prostate: Secondary | ICD-10-CM | POA: Diagnosis not present

## 2022-08-06 DIAGNOSIS — Z79818 Long term (current) use of other agents affecting estrogen receptors and estrogen levels: Secondary | ICD-10-CM | POA: Diagnosis not present

## 2022-08-06 DIAGNOSIS — Z23 Encounter for immunization: Secondary | ICD-10-CM | POA: Diagnosis not present

## 2022-08-06 NOTE — Assessment & Plan Note (Signed)
Stable at the present time. 

## 2022-08-06 NOTE — Progress Notes (Signed)
Established Patient Office Visit  Subjective:  Patient ID: Mathew Martinez, male    DOB: 1931/09/08  Age: 88 y.o. MRN: [id number]  CC:  Chief Complaint  Patient presents with   Hypertension    Hypertension    Mathew Martinez presents for check up  Past Medical History:  Diagnosis Date   Arthritis    wrist   Basal cell carcinoma 02/25/2013   Left mastoid area. Excised, margins free.   Basal cell carcinoma 03/04/2013   Left posterior shoulder. Excised, margins free.   BPH (benign prostatic hyperplasia)    Dysplastic nevus 09/14/2013   Right upper back post. shoulder. Moderate to severe atypia, close to margin.   Dysplastic nevus 09/14/2013   Right sup. popliteal. Moderate atypia, lateral and deep margins involved.   Dysplastic nevus 09/14/2013   Right proximal med. calf. Moderate atypia, deep margin involved.    Dysplastic nevus 09/14/2013   Right distal med. calf. Moderate atypia, lateral and deep margins involved.    Hard of hearing    Heart murmur    History of basal cell carcinoma 02/18/2013   Right posterior wrist. Excised, margins free.   History of melanoma 01/14/2013   left upper back   Hyperlipemia    Hypertension    Prostate cancer ([place]) 12/29/2019    Past Surgical History:  Procedure Laterality Date   APPENDECTOMY     CATARACT EXTRACTION W/PHACO Right 03/19/2018   Procedure: CATARACT EXTRACTION PHACO AND INTRAOCULAR LENS PLACEMENT ([place]) COMPLICATED RIGHT;  Surgeon: Leandrew Koyanagi, MD;  Location: [facility];  Service: Ophthalmology;  Laterality: Right;  MALYUGIN   CHOLECYSTECTOMY     COLONOSCOPY WITH PROPOFOL N/A 07/15/2015   Procedure: COLONOSCOPY WITH PROPOFOL;  Surgeon: Josefine Class, MD;  Location: [facility] ENDOSCOPY;  Service: Endoscopy;  Laterality: N/A;   Duodenojejunostomy     HERNIA REPAIR     prostate tissue removal  1996    Family History  Problem Relation Age of Onset   Heart disease Mother    Diabetes Mother     Cancer Father    Prostate cancer Brother     Social History   Socioeconomic History   Marital status: Married    Spouse name: Not on file   Number of children: Not on file   Years of education: Not on file   Highest education level: Not on file  Occupational History   Occupation: retired  Tobacco Use   Smoking status: Former    Packs/day: 0.50    Years: 50.00    Total pack years: 25.00    Types: Cigarettes    Quit date: 12/29/1999    Years since quitting: 22.6   Smokeless tobacco: Never  Vaping Use   Vaping Use: Never used  Substance and Sexual Activity   Alcohol use: Never   Drug use: Never   Sexual activity: Not on file  Other Topics Concern   Not on file  Social History Narrative   Not on file   Social Determinants of Health   Financial Resource Strain: [place]  (10/04/2021)   Overall Financial Resource Strain (CARDIA)    Difficulty of Paying Living Expenses: Not hard at all  Food Insecurity: No [place] (10/04/2021)   Hunger Vital Sign    Worried About Running Out of Food in the Last Year: Never true    Pulcifer in the Last Year: Never true  Transportation Needs: No Transportation Needs (10/04/2021)   [place] - Transportation  Lack of Transportation (Medical): No    Lack of Transportation (Non-Medical): No  Physical Activity: Sufficiently Active (10/04/2021)   Exercise Vital Sign    Days of Exercise per Week: 7 days    Minutes of Exercise per Session: 30 min  Stress: No Stress Concern Present (10/04/2021)   [place]    Feeling of Stress : Not at all  Social Connections: Moderately Integrated (10/04/2021)   Social Connection and Isolation Panel [NHANES]    Frequency of Communication with Friends and Family: More than three times a week    Frequency of Social Gatherings with Friends and Family: More than three times a week    Attends Religious Services: Never    Building surveyor or Organizations: No    Attends Music therapist: More than 4 times per year    Marital Status: Married  Human resources officer Violence: Not At Risk (10/04/2021)   Humiliation, Afraid, Rape, and Kick questionnaire    Fear of Current or Ex-Partner: No    Emotionally Abused: No    Physically Abused: No    Sexually Abused: No     Current Outpatient Medications:    Calcium Carbonate-Vit D-Min (CALCIUM 1200 PO), Take 600 mg by mouth., Disp: , Rfl:    Cholecalciferol 25 MCG (1000 UT) tablet, Take 1,000 Units by mouth daily., Disp: , Rfl:    co-enzyme Q-10 30 MG capsule, Take 30 mg by mouth daily., Disp: , Rfl:    doxazosin (CARDURA) 4 MG tablet, Take 1 tablet (4 mg total) by mouth daily., Disp: 90 tablet, Rfl: 1   ketoconazole (NIZORAL) 2 % cream, Apply to the feet QHS, Disp: 60 g, Rfl: 3   loratadine (CLARITIN) 10 MG tablet, Take 1 tablet (10 mg total) by mouth daily., Disp: 30 tablet, Rfl: 11   lovastatin (MEVACOR) 20 MG tablet, Take 1 tablet (20 mg total) by mouth at bedtime., Disp: 90 tablet, Rfl: 0   metoprolol succinate (TOPROL-XL) 25 MG 24 hr tablet, Take 1 tablet (25 mg total) by mouth daily., Disp: 90 tablet, Rfl: 0   Multiple Vitamins-Minerals (MULTIVITAMIN WITH MINERALS) tablet, Take 1 tablet by mouth daily., Disp: , Rfl:    mupirocin ointment (BACTROBAN) 2 %, Apply 1 application topically daily. With dressing changes, Disp: 22 g, Rfl: 0   Omega-3 Fatty Acids (FISH OIL) 500 MG CAPS, Take 500 mg by mouth., Disp: , Rfl:    TURMERIC CURCUMIN PO, Take 550 mg by mouth., Disp: , Rfl:    vitamin B-12 (CYANOCOBALAMIN) 1000 MCG tablet, Take 1 tablet (1,000 mcg total) by mouth daily., Disp: 90 tablet, Rfl: 1   Allergies  Allergen Reactions   Lisinopril Other (See Comments)    Face Swelling    ROS Review of Systems  Constitutional: Negative.   HENT: Negative.    Eyes: Negative.   Respiratory: Negative.    Cardiovascular: Negative.   Gastrointestinal:  Negative.   Endocrine: Negative.   Genitourinary: Negative.   Musculoskeletal: Negative.   Skin: Negative.   Allergic/Immunologic: Negative.   Neurological: Negative.   Hematological: Negative.   Psychiatric/Behavioral: Negative.    All other systems reviewed and are negative.     Objective:    Physical Exam Vitals reviewed.  Constitutional:      Appearance: Normal appearance.  HENT:     Mouth/Throat:     Mouth: Mucous membranes are moist.  Eyes:     Pupils: Pupils are equal, round, and  reactive to light.  Neck:     Vascular: No carotid bruit.  Cardiovascular:     Rate and Rhythm: Normal rate and regular rhythm.     Pulses: Normal pulses.     Heart sounds: Normal heart sounds.  Pulmonary:     Effort: Pulmonary effort is normal.     Breath sounds: Normal breath sounds.  Abdominal:     General: Bowel sounds are normal.     Palpations: Abdomen is soft. There is no hepatomegaly, splenomegaly or mass.     Tenderness: There is no abdominal tenderness.     Hernia: No hernia is present.  Musculoskeletal:     Cervical back: Neck supple.     Right lower leg: No edema.     Left lower leg: No edema.  Skin:    Findings: No rash.  Neurological:     Mental Status: He is alert and oriented to person, place, and time.     Motor: No weakness.  Psychiatric:        Mood and Affect: Mood normal.        Behavior: Behavior normal.     BP 111/63   Pulse 84   Ht 5' 9"  (1.753 m)   Wt 143 lb 11.2 oz (65.2 kg)   BMI 21.22 kg/m  Wt Readings from Last 3 Encounters:  08/06/22 143 lb 11.2 oz (65.2 kg)  05/07/22 143 lb 3.2 oz (65 kg)  01/22/22 144 lb 3.2 oz (65.4 kg)     Health Maintenance Due  Topic Date Due   TETANUS/TDAP  Never done   Zoster Vaccines- Shingrix (1 of 2) Never done   Pneumonia Vaccine 80+ Years old (1 - PCV) Never done   COVID-19 Vaccine (6 - Pfizer risk series) 10/03/2021    There are no preventive care reminders to display for this patient.  Lab Results   Component Value Date   TSH 2.90 04/30/2022   Lab Results  Component Value Date   WBC 5.4 04/30/2022   HGB 12.2 (L) 04/30/2022   HCT 36.0 (L) 04/30/2022   MCV 96.8 04/30/2022   PLT 171 04/30/2022   Lab Results  Component Value Date   NA 145 04/30/2022   K 4.7 04/30/2022   CO2 22 04/30/2022   GLUCOSE 103 (H) 04/30/2022   BUN 15 04/30/2022   CREATININE 1.03 04/30/2022   BILITOT 1.3 (H) 04/30/2022   ALKPHOS 70 11/02/2021   AST 18 04/30/2022   ALT 18 04/30/2022   PROT 6.4 04/30/2022   ALBUMIN 3.9 11/02/2021   CALCIUM 9.3 04/30/2022   ANIONGAP 7 11/02/2021   EGFR 69 04/30/2022   Lab Results  Component Value Date   CHOL 136 04/30/2022   Lab Results  Component Value Date   HDL 45 04/30/2022   Lab Results  Component Value Date   LDLCALC 71 04/30/2022   Lab Results  Component Value Date   TRIG 123 04/30/2022   Lab Results  Component Value Date   CHOLHDL 3.0 04/30/2022   No results found for: "HGBA1C"    Assessment & Plan:   Problem List Items Addressed This Visit       Cardiovascular and Mediastinum   Essential hypertension     Patient denies any chest pain or shortness of breath there is no history of palpitation or paroxysmal nocturnal dyspnea   patient was advised to follow low-salt low-cholesterol diet    ideally I want to keep systolic blood pressure below 130 mmHg, patient was asked  to check blood pressure one times a week and give me a report on that.  Patient will be follow-up in 3 months  or earlier as needed, patient will call me back for any change in the cardiovascular symptoms Patient was advised to buy a book from local bookstore concerning blood pressure and read several chapters  every day.  This will be supplemented by some of the material we will give him from the office.  Patient should also utilize other resources like YouTube and Internet to learn more about the blood pressure and the diet.        Respiratory   Seasonal allergic  rhinitis due to pollen    Stable at the present time        Genitourinary   Prostate cancer [facility])    Under treatment        Other   Hyperlipidemia    Hypercholesterolemia  I advised the patient to follow Mediterranean diet This diet is rich in fruits vegetables and whole grain, and This diet is also rich in fish and lean meat Patient should also eat a handful of almonds or walnuts daily Recent heart study indicated that average follow-up on this kind of diet reduces the cardiovascular mortality by 50 to 70%==      Androgen deprivation therapy    Patient was advised to continue with androgen deprivation therapy      Other Visit Diagnoses     Need for influenza vaccination    -  Primary   Relevant Orders   Flu Vaccine QUAD High Dose(Fluad) (Completed)       No orders of the defined types were placed in this encounter.   Follow-up: No follow-ups on file.    Cletis Athens, MD

## 2022-08-06 NOTE — Assessment & Plan Note (Signed)
Patient was advised to continue with androgen deprivation therapy

## 2022-08-06 NOTE — Assessment & Plan Note (Signed)

## 2022-08-06 NOTE — Assessment & Plan Note (Signed)
Hypercholesterolemia  I advised the patient to follow Mediterranean diet This diet is rich in fruits vegetables and whole grain, and This diet is also rich in fish and lean meat Patient should also eat a handful of almonds or walnuts daily Recent heart study indicated that average follow-up on this kind of diet reduces the cardiovascular mortality by 50 to 70%== 

## 2022-08-06 NOTE — Assessment & Plan Note (Signed)
Under treatment. 

## 2022-08-23 ENCOUNTER — Ambulatory Visit: Payer: Medicare HMO | Admitting: Dermatology

## 2022-08-31 ENCOUNTER — Ambulatory Visit: Payer: Medicare HMO | Admitting: Dermatology

## 2022-08-31 DIAGNOSIS — L578 Other skin changes due to chronic exposure to nonionizing radiation: Secondary | ICD-10-CM

## 2022-08-31 DIAGNOSIS — Z86018 Personal history of other benign neoplasm: Secondary | ICD-10-CM

## 2022-08-31 DIAGNOSIS — D229 Melanocytic nevi, unspecified: Secondary | ICD-10-CM

## 2022-08-31 DIAGNOSIS — L820 Inflamed seborrheic keratosis: Secondary | ICD-10-CM

## 2022-08-31 DIAGNOSIS — Z85828 Personal history of other malignant neoplasm of skin: Secondary | ICD-10-CM

## 2022-08-31 DIAGNOSIS — L821 Other seborrheic keratosis: Secondary | ICD-10-CM | POA: Diagnosis not present

## 2022-08-31 DIAGNOSIS — Z85820 Personal history of malignant melanoma of skin: Secondary | ICD-10-CM

## 2022-08-31 DIAGNOSIS — L814 Other melanin hyperpigmentation: Secondary | ICD-10-CM

## 2022-08-31 DIAGNOSIS — L853 Xerosis cutis: Secondary | ICD-10-CM

## 2022-08-31 DIAGNOSIS — L720 Epidermal cyst: Secondary | ICD-10-CM | POA: Diagnosis not present

## 2022-08-31 DIAGNOSIS — Z86006 Personal history of melanoma in-situ: Secondary | ICD-10-CM

## 2022-08-31 DIAGNOSIS — Z1283 Encounter for screening for malignant neoplasm of skin: Secondary | ICD-10-CM

## 2022-08-31 NOTE — Progress Notes (Unsigned)
Follow-Up Visit   Subjective  Mathew Martinez is a 88 y.o. male who presents for the following: Annual Exam (Hx MM, BCC, dysplastic nevi). The patient presents for Total-Body Skin Exam (TBSE) for skin cancer screening and mole check.  The patient has spots, moles and lesions to be evaluated, some may be new or changing and the patient has concerns that these could be cancer.  The following portions of the chart were reviewed this encounter and updated as appropriate:   Tobacco  Allergies  Meds  Problems  Med Hx  Surg Hx  Fam Hx     Review of Systems:  No other skin or systemic complaints except as noted in HPI or Assessment and Plan.  Objective  Well appearing patient in no apparent distress; mood and affect are within normal limits.  A full examination was performed including scalp, head, eyes, ears, nose, lips, neck, chest, axillae, abdomen, back, buttocks, bilateral upper extremities, bilateral lower extremities, hands, feet, fingers, toes, fingernails, and toenails. All findings within normal limits unless otherwise noted below.  Left Lower Back Erythematous stuck-on, waxy papule or plaque  Right Lower Back 0.7 cm Subcutaneous nodule.    Assessment & Plan  Inflamed seborrheic keratosis Left Lower Back Symptomatic, irritating, patient would like treated. Patient advised to RTC if not resolved in 6 weeks.   Destruction of lesion - Left Lower Back Complexity: simple   Destruction method: cryotherapy   Informed consent: discussed and consent obtained   Timeout:  patient name, date of birth, surgical site, and procedure verified Lesion destroyed using liquid nitrogen: Yes   Region frozen until ice ball extended beyond lesion: Yes   Outcome: patient tolerated procedure well with no complications   Post-procedure details: wound care instructions given    Epidermal inclusion cyst Right Lower Back Benign-appearing. Exam most consistent with an epidermal inclusion  cyst. Discussed that a cyst is a benign growth that can grow over time and sometimes get irritated or inflamed. Recommend observation if it is not bothersome. Discussed option of surgical excision to remove it if it is growing, symptomatic, or other changes noted. Please call for new or changing lesions so they can be evaluated.  History of Melanoma in Situ - No evidence of recurrence today at left upper back - Recommend regular full body skin exams - Recommend daily broad spectrum sunscreen SPF 30+ to sun-exposed areas, reapply every 2 hours as needed.  - Call if any new or changing lesions are noted between office visits  History of Basal Cell Carcinoma of the Skin - No evidence of recurrence today - Recommend regular full body skin exams - Recommend daily broad spectrum sunscreen SPF 30+ to sun-exposed areas, reapply every 2 hours as needed.  - Call if any new or changing lesions are noted between office visits  History of Dysplastic Nevi - No evidence of recurrence today - Recommend regular full body skin exams - Recommend daily broad spectrum sunscreen SPF 30+ to sun-exposed areas, reapply every 2 hours as needed.  - Call if any new or changing lesions are noted between office visits  Lentigines - Scattered tan macules - Due to sun exposure - Benign-appearing, observe - Recommend daily broad spectrum sunscreen SPF 30+ to sun-exposed areas, reapply every 2 hours as needed. - Call for any changes  Seborrheic Keratoses - Stuck-on, waxy, tan-brown papules and/or plaques  - Benign-appearing - Discussed benign etiology and prognosis. - Observe - Call for any changes  Melanocytic Nevi - Tan-brown  and/or pink-flesh-colored symmetric macules and papules - Benign appearing on exam today - Observation - Call clinic for new or changing moles - Recommend daily use of broad spectrum spf 30+ sunscreen to sun-exposed areas.   Hemangiomas - Red papules - Discussed benign nature -  Observe - Call for any changes  Actinic Damage - Chronic condition, secondary to cumulative UV/sun exposure - diffuse scaly erythematous macules with underlying dyspigmentation - Recommend daily broad spectrum sunscreen SPF 30+ to sun-exposed areas, reapply every 2 hours as needed.  - Staying in the shade or wearing long sleeves, sun glasses (UVA+UVB protection) and wide brim hats (4-inch brim around the entire circumference of the hat) are also recommended for sun protection.  - Call for new or changing lesions.  Skin cancer screening performed today.  Xerosis - diffuse xerotic patches - recommend gentle, hydrating skin care - gentle skin care handout given  Return in about 1 year (around 09/01/2023) for TBSE, Hx MMis, Hx BCC, Hx Dysplastic Nevi.  Graciella Belton, RMA, am acting as scribe for Sarina Ser, MD . Documentation: I have reviewed the above documentation for accuracy and completeness, and I agree with the above.  Sarina Ser, MD

## 2022-08-31 NOTE — Patient Instructions (Signed)
Cryotherapy Aftercare  Wash gently with soap and water everyday.   Apply Vaseline and Band-Aid daily until healed.   Melanoma ABCDEs  Melanoma is the most dangerous type of skin cancer, and is the leading cause of death from skin disease.  You are more likely to develop melanoma if you: Have light-colored skin, light-colored eyes, or red or blond hair Spend a lot of time in the sun Tan regularly, either outdoors or in a tanning bed Have had blistering sunburns, especially during childhood Have a close family member who has had a melanoma Have atypical moles or large birthmarks  Early detection of melanoma is key since treatment is typically straightforward and cure rates are extremely high if we catch it early.   The first sign of melanoma is often a change in a mole or a new dark spot.  The ABCDE system is a way of remembering the signs of melanoma.  A for asymmetry:  The two halves do not match. B for border:  The edges of the growth are irregular. C for color:  A mixture of colors are present instead of an even brown color. D for diameter:  Melanomas are usually (but not always) greater than 6mm - the size of a pencil eraser. E for evolution:  The spot keeps changing in size, shape, and color.  Please check your skin once per month between visits. You can use a small mirror in front and a large mirror behind you to keep an eye on the back side or your body.   If you see any new or changing lesions before your next follow-up, please call to schedule a visit.  Please continue daily skin protection including broad spectrum sunscreen SPF 30+ to sun-exposed areas, reapplying every 2 hours as needed when you're outdoors.     Due to recent changes in healthcare laws, you may see results of your pathology and/or laboratory studies on MyChart before the doctors have had a chance to review them. We understand that in some cases there may be results that are confusing or concerning to you.  Please understand that not all results are received at the same time and often the doctors may need to interpret multiple results in order to provide you with the best plan of care or course of treatment. Therefore, we ask that you please give us 2 business days to thoroughly review all your results before contacting the office for clarification. Should we see a critical lab result, you will be contacted sooner.   If You Need Anything After Your Visit  If you have any questions or concerns for your doctor, please call our main line at [phone] and press option 4 to reach your doctor's medical assistant. If no one answers, please leave a voicemail as directed and we will return your call as soon as possible. Messages left after 4 pm will be answered the following business day.   You may also send us a message via MyChart. We typically respond to MyChart messages within 1-2 business days.  For prescription refills, please ask your pharmacy to contact our office. Our fax number is [phone].  If you have an urgent issue when the clinic is closed that cannot wait until the next business day, you can page your doctor at the number below.    Please note that while we do our best to be available for urgent issues outside of office hours, we are not available 24/7.   If you have an urgent   issue and are unable to reach us, you may choose to seek medical care at your doctor's office, retail clinic, urgent care center, or emergency room.  If you have a medical emergency, please immediately call 911 or go to the emergency department.  Pager Numbers  - Dr. Kowalski: [phone]  - Dr. Moye: [phone]  - Dr. Stewart: [phone]  In the event of inclement weather, please call our main line at [phone] for an update on the status of any delays or closures.  Dermatology Medication Tips: Please keep the boxes that topical medications come in in order to help keep track of the  instructions about where and how to use these. Pharmacies typically print the medication instructions only on the boxes and not directly on the medication tubes.   If your medication is too expensive, please contact our office at [phone] option 4 or send us a message through MyChart.   We are unable to tell what your co-pay for medications will be in advance as this is different depending on your insurance coverage. However, we may be able to find a substitute medication at lower cost or fill out paperwork to get insurance to cover a needed medication.   If a prior authorization is required to get your medication covered by your insurance company, please allow us 1-2 business days to complete this process.  Drug prices often vary depending on where the prescription is filled and some pharmacies may offer cheaper prices.  The website www.goodrx.com contains coupons for medications through different pharmacies. The prices here do not account for what the cost may be with help from insurance (it may be cheaper with your insurance), but the website can give you the price if you did not use any insurance.  - You can print the associated coupon and take it with your prescription to the pharmacy.  - You may also stop by our office during regular business hours and pick up a GoodRx coupon card.  - If you need your prescription sent electronically to a different pharmacy, notify our office through [facility] MyChart or by phone at [phone] option 4.     Si Usted Necesita Algo Despus de Su Visita  Tambin puede enviarnos un mensaje a travs de MyChart. Por lo general respondemos a los mensajes de MyChart en el transcurso de 1 a 2 das hbiles.  Para renovar recetas, por favor pida a su farmacia que se ponga en contacto con nuestra oficina. Nuestro nmero de fax es el [phone].  Si tiene un asunto urgente cuando la clnica est cerrada y que no puede esperar hasta el siguiente da hbil,  puede llamar/localizar a su doctor(a) al nmero que aparece a continuacin.   Por favor, tenga en cuenta que aunque hacemos todo lo posible para estar disponibles para asuntos urgentes fuera del horario de oficina, no estamos disponibles las 24 horas del da, los 7 das de la semana.   Si tiene un problema urgente y no puede comunicarse con nosotros, puede optar por buscar atencin mdica  en el consultorio de su doctor(a), en una clnica privada, en un centro de atencin urgente o en una sala de emergencias.  Si tiene una emergencia mdica, por favor llame inmediatamente al 911 o vaya a la sala de emergencias.  Nmeros de bper  - Dr. Kowalski: [phone]  - Dra. Moye: [phone]  - Dra. Stewart: [phone]  En caso de inclemencias del tiempo, por favor llame a nuestra lnea principal al [phone] para una actualizacin   sobre el estado de cualquier retraso o cierre.  Consejos para la medicacin en dermatologa: Por favor, guarde las cajas en las que vienen los medicamentos de uso tpico para ayudarle a seguir las instrucciones sobre dnde y cmo usarlos. Las farmacias generalmente imprimen las instrucciones del medicamento slo en las cajas y no directamente en los tubos del medicamento.   Si su medicamento es muy caro, por favor, pngase en contacto con nuestra oficina llamando al [phone] y presione la opcin 4 o envenos un mensaje a travs de MyChart.   No podemos decirle cul ser su copago por los medicamentos por adelantado ya que esto es diferente dependiendo de la cobertura de su seguro. Sin embargo, es posible que podamos encontrar un medicamento sustituto a menor costo o llenar un formulario para que el seguro cubra el medicamento que se considera necesario.   Si se requiere una autorizacin previa para que su compaa de seguros cubra su medicamento, por favor permtanos de 1 a 2 das hbiles para completar este proceso.  Los precios de los medicamentos varan con  frecuencia dependiendo del lugar de dnde se surte la receta y alguna farmacias pueden ofrecer precios ms baratos.  El sitio web www.goodrx.com tiene cupones para medicamentos de diferentes farmacias. Los precios aqu no tienen en cuenta lo que podra costar con la ayuda del seguro (puede ser ms barato con su seguro), pero el sitio web puede darle el precio si no utiliz ningn seguro.  - Puede imprimir el cupn correspondiente y llevarlo con su receta a la farmacia.  - Tambin puede pasar por nuestra oficina durante el horario de atencin regular y recoger una tarjeta de cupones de GoodRx.  - Si necesita que su receta se enve electrnicamente a una farmacia diferente, informe a nuestra oficina a travs de MyChart de [place] o por telfono llamando al [phone] y presione la opcin 4.  

## 2022-09-06 ENCOUNTER — Telehealth: Payer: Self-pay | Admitting: Internal Medicine

## 2022-09-06 NOTE — Telephone Encounter (Signed)
R/S AWV ON 10/08/2022. If patient calls office, please transfer call to me jabber [phone].

## 2022-09-08 ENCOUNTER — Encounter: Payer: Self-pay | Admitting: Dermatology

## 2022-10-08 ENCOUNTER — Encounter: Payer: Medicare HMO | Admitting: Internal Medicine

## 2022-10-30 ENCOUNTER — Inpatient Hospital Stay: Payer: Medicare HMO | Attending: Oncology

## 2022-10-30 DIAGNOSIS — Z79899 Other long term (current) drug therapy: Secondary | ICD-10-CM | POA: Diagnosis not present

## 2022-10-30 DIAGNOSIS — D649 Anemia, unspecified: Secondary | ICD-10-CM | POA: Diagnosis not present

## 2022-10-30 DIAGNOSIS — E538 Deficiency of other specified B group vitamins: Secondary | ICD-10-CM | POA: Insufficient documentation

## 2022-10-30 DIAGNOSIS — R32 Unspecified urinary incontinence: Secondary | ICD-10-CM | POA: Insufficient documentation

## 2022-10-30 DIAGNOSIS — C61 Malignant neoplasm of prostate: Secondary | ICD-10-CM | POA: Diagnosis not present

## 2022-10-30 LAB — COMPREHENSIVE METABOLIC PANEL
ALT: 19 U/L (ref 0–44)
AST: 24 U/L (ref 15–41)
Albumin: 4 g/dL (ref 3.5–5.0)
Alkaline Phosphatase: 70 U/L (ref 38–126)
Anion gap: 9 (ref 5–15)
BUN: 18 mg/dL (ref 8–23)
CO2: 27 mmol/L (ref 22–32)
Calcium: 8.7 mg/dL — ABNORMAL LOW (ref 8.9–10.3)
Chloride: 105 mmol/L (ref 98–111)
Creatinine, Ser: 0.99 mg/dL (ref 0.61–1.24)
GFR, Estimated: >60 mL/min (ref >60)
Glucose, Bld: 136 mg/dL — ABNORMAL HIGH (ref 70–99)
Potassium: 4 mmol/L (ref 3.5–5.1)
Sodium: 141 mmol/L (ref 135–145)
Total Bilirubin: 1.3 mg/dL — ABNORMAL HIGH (ref 0.3–1.2)
Total Protein: 6.4 g/dL — ABNORMAL LOW (ref 6.5–8.1)

## 2022-10-30 LAB — CBC WITH DIFFERENTIAL/PLATELET
Abs Immature Granulocytes: 0.02 10*3/uL (ref 0.00–0.07)
Basophils Absolute: 0 10*3/uL (ref 0.0–0.1)
Basophils Relative: 0 %
Eosinophils Absolute: 0.2 10*3/uL (ref 0.0–0.5)
Eosinophils Relative: 3 %
HCT: 36 % — ABNORMAL LOW (ref 39.0–52.0)
Hemoglobin: 12.3 g/dL — ABNORMAL LOW (ref 13.0–17.0)
Immature Granulocytes: 0 %
Lymphocytes Relative: 22 %
Lymphs Abs: 1.2 10*3/uL (ref 0.7–4.0)
MCH: 32.3 pg (ref 26.0–34.0)
MCHC: 34.2 g/dL (ref 30.0–36.0)
MCV: 94.5 fL (ref 80.0–100.0)
Monocytes Absolute: 0.4 10*3/uL (ref 0.1–1.0)
Monocytes Relative: 7 %
Neutro Abs: 3.5 10*3/uL (ref 1.7–7.7)
Neutrophils Relative %: 68 %
Platelets: 182 10*3/uL (ref 150–400)
RBC: 3.81 MIL/uL — ABNORMAL LOW (ref 4.22–5.81)
RDW: 13.1 % (ref 11.5–15.5)
WBC: 5.3 10*3/uL (ref 4.0–10.5)
nRBC: 0 % (ref 0.0–0.2)

## 2022-10-30 LAB — VITAMIN B12: Vitamin B-12: 612 pg/mL (ref 180–914)

## 2022-10-30 LAB — FOLATE: Folate: 31 ng/mL (ref >5.9)

## 2022-10-30 LAB — PSA: Prostatic Specific Antigen: <0.01 ng/mL (ref 0.00–4.00)

## 2022-11-02 ENCOUNTER — Inpatient Hospital Stay: Payer: Medicare HMO | Admitting: Oncology

## 2022-11-02 ENCOUNTER — Encounter: Payer: Self-pay | Admitting: Oncology

## 2022-11-02 VITALS — BP 124/65 | HR 72 | Temp 96.5°F | Resp 100

## 2022-11-02 DIAGNOSIS — R911 Solitary pulmonary nodule: Secondary | ICD-10-CM | POA: Diagnosis not present

## 2022-11-02 DIAGNOSIS — D649 Anemia, unspecified: Secondary | ICD-10-CM | POA: Diagnosis not present

## 2022-11-02 DIAGNOSIS — Z79899 Other long term (current) drug therapy: Secondary | ICD-10-CM | POA: Diagnosis not present

## 2022-11-02 DIAGNOSIS — C61 Malignant neoplasm of prostate: Secondary | ICD-10-CM

## 2022-11-02 DIAGNOSIS — R32 Unspecified urinary incontinence: Secondary | ICD-10-CM | POA: Diagnosis not present

## 2022-11-02 DIAGNOSIS — E538 Deficiency of other specified B group vitamins: Secondary | ICD-10-CM | POA: Diagnosis not present

## 2022-11-02 NOTE — Assessment & Plan Note (Signed)
T2bN0M0 prostate cancer was discussed. #Risk stratification Grade group 4/Gleason 8 (4+4), PSA 15,  high risk group.   S/p prostate radiation iron recommend with androgen deprivation therapy [01/21/20-11/03/2021]  Labs are reviewed and discussed with patient.  PSA is stably <0.01 Close monitor.

## 2022-11-03 ENCOUNTER — Encounter: Payer: Self-pay | Admitting: Oncology

## 2022-11-03 DIAGNOSIS — R911 Solitary pulmonary nodule: Secondary | ICD-10-CM | POA: Insufficient documentation

## 2022-11-03 NOTE — Assessment & Plan Note (Signed)
Stable or resolved on CT.  I will hold off additional CT surveillance.

## 2022-11-03 NOTE — Progress Notes (Signed)
Hematology/Oncology follow up note [phone] [phone]   Patient Care Team: Cletis Athens, MD as PCP - General (Internal Medicine) Noreene Filbert, MD as Radiation Oncologist (Radiation Oncology)  REFERRING PROVIDER: Cletis Athens, MD  CHIEF COMPLAINTS/REASON FOR VISIT:  Follow-up for prostate cancer  ASSESSMENT & PLAN:   Prostate cancer [facility]) T2bN0M0 prostate cancer was discussed. #Risk stratification Grade group 4/Gleason 8 (4+4), PSA 15,  high risk group.   S/p prostate radiation iron recommend with androgen deprivation therapy [01/21/20-11/03/2021]  Labs are reviewed and discussed with patient.  PSA is stably <0.01 Close monitor.   Lung nodule Stable or resolved on CT.  I will hold off additional CT surveillance.   Normocytic anemia Hemoglobin level is stable. Monitor.   B12 deficiency B12 level is normal.  Recommend patient to continue B12 102mg supplementation 2-3 times per week.   Orders Placed This Encounter  Procedures   CBC with Differential/Platelet    Standing Status:   Future    Standing Expiration Date:   11/03/2023   Comprehensive metabolic panel    Standing Status:   Future    Standing Expiration Date:   11/03/2023   PSA    Standing Status:   Future    Standing Expiration Date:   11/03/2023   Ferritin    Standing Status:   Future    Standing Expiration Date:   11/03/2023   Iron and TIBC    Standing Status:   Future    Standing Expiration Date:   11/03/2023   Vitamin B12    Standing Status:   Future    Standing Expiration Date:   11/03/2023   Follow up in 1 year.  All questions were answered. The patient knows to call the clinic with any problems, questions or concerns.  ZEarlie Server MD, PhD [facility] 11/02/2022   HISTORY OF PRESENTING ILLNESS:  Mathew SPROULEis a  89y.o.  male presents for follow up of prostate cancer  INTERVAL HISTORY Mathew WILLHOITEis a 89y.o. male who has above history  reviewed by me today presents for follow up visit for management of prostate cancer Reports feeling well. No new complaints.  Mild urinary leakage.    Review of Systems  Constitutional:  Negative for appetite change, chills, fatigue, fever and unexpected weight change.  HENT:   Negative for hearing loss and voice change.   Eyes:  Negative for eye problems and icterus.  Respiratory:  Negative for chest tightness, cough and shortness of breath.   Cardiovascular:  Negative for chest pain and leg swelling.  Gastrointestinal:  Negative for abdominal distention and abdominal pain.  Endocrine: Negative for hot flashes.  Genitourinary:  Negative for difficulty urinating, dysuria and frequency.        Urinary incontinence  Musculoskeletal:  Negative for arthralgias.  Skin:  Negative for itching and rash.  Neurological:  Negative for light-headedness and numbness.  Hematological:  Negative for adenopathy. Does not bruise/bleed easily.  Psychiatric/Behavioral:  Negative for confusion.     MEDICAL HISTORY:  Past Medical History:  Diagnosis Date   Arthritis    wrist   Basal cell carcinoma 02/25/2013   Left mastoid area. Excised, margins free.   Basal cell carcinoma 03/04/2013   Left posterior shoulder. Excised, margins free.   BPH (benign prostatic hyperplasia)    Dysplastic nevus 09/14/2013   Right upper back post. shoulder. Moderate to severe atypia, close to margin.   Dysplastic nevus 09/14/2013   Right sup.  popliteal. Moderate atypia, lateral and deep margins involved.   Dysplastic nevus 09/14/2013   Right proximal med. calf. Moderate atypia, deep margin involved.    Dysplastic nevus 09/14/2013   Right distal med. calf. Moderate atypia, lateral and deep margins involved.    Hard of hearing    Heart murmur    History of basal cell carcinoma 02/18/2013   Right posterior wrist. Excised, margins free.   History of melanoma 01/14/2013   left upper back   Hyperlipemia    Hypertension     Prostate cancer ([place]) 12/29/2019    SURGICAL HISTORY: Past Surgical History:  Procedure Laterality Date   APPENDECTOMY     CATARACT EXTRACTION W/PHACO Right 03/19/2018   Procedure: CATARACT EXTRACTION PHACO AND INTRAOCULAR LENS PLACEMENT ([place]) COMPLICATED RIGHT;  Surgeon: Leandrew Koyanagi, MD;  Location: [place];  Service: Ophthalmology;  Laterality: Right;  MALYUGIN   CHOLECYSTECTOMY     COLONOSCOPY WITH PROPOFOL N/A 07/15/2015   Procedure: COLONOSCOPY WITH PROPOFOL;  Surgeon: Josefine Class, MD;  Location: [facility]COPY;  Service: Endoscopy;  Laterality: N/A;   Duodenojejunostomy     HERNIA REPAIR     prostate tissue removal  1996    SOCIAL HISTORY: Social History   Socioeconomic History   Marital status: Married    Spouse name: Not on file   Number of children: Not on file   Years of education: Not on file   Highest education level: Not on file  Occupational History   Occupation: retired  Tobacco Use   Smoking status: Former    Packs/day: 0.50    Years: 50.00    Total pack years: 25.00    Types: Cigarettes    Quit date: 12/29/1999    Years since quitting: 22.8   Smokeless tobacco: Never  Vaping Use   Vaping Use: Never used  Substance and Sexual Activity   Alcohol use: Never   Drug use: Never   Sexual activity: Not on file  Other Topics Concern   Not on file  Social History Narrative   Not on file   Social Determinants of Health   Financial Resource Strain: Low Risk  (10/04/2021)   Overall Financial Resource Strain (CARDIA)    Difficulty of Paying Living Expenses: Not hard at all  Food Insecurity: No [place] (10/04/2021)   Hunger Vital Sign    Worried About Running Out of Food in the Last Year: Never true    [place] in the Last Year: Never true  Transportation Needs: No Transportation Needs (10/04/2021)   PRAPARE - Hydrologist (Medical): No    Lack of Transportation (Non-Medical): No   Physical Activity: Sufficiently Active (10/04/2021)   Exercise Vital Sign    Days of Exercise per Week: 7 days    Minutes of Exercise per Session: 30 min  Stress: No Stress Concern Present (10/04/2021)   [place]    Feeling of Stress : Not at all  Social Connections: Moderately Integrated (10/04/2021)   Social Connection and Isolation Panel [NHANES]    Frequency of Communication with Friends and Family: More than three times a week    Frequency of Social Gatherings with Friends and Family: More than three times a week    Attends Religious Services: Never    Marine scientist or Organizations: No    Attends Music therapist: More than 4 times per year    Marital  Status: Married  Human resources officer Violence: Not At Risk (10/04/2021)   Humiliation, Afraid, Rape, and Kick questionnaire    Fear of Current or Ex-Partner: No    Emotionally Abused: No    Physically Abused: No    Sexually Abused: No    FAMILY HISTORY: Family History  Problem Relation Age of Onset   Heart disease Mother    Diabetes Mother    Cancer Father    Prostate cancer Brother     ALLERGIES:  is allergic to lisinopril.  MEDICATIONS:  Current Outpatient Medications  Medication Sig Dispense Refill   Calcium Carbonate-Vit D-Min (CALCIUM 1200 PO) Take 600 mg by mouth.     Cholecalciferol 25 MCG (1000 UT) tablet Take 1,000 Units by mouth daily.     co-enzyme Q-10 30 MG capsule Take 30 mg by mouth daily.     doxazosin (CARDURA) 4 MG tablet Take 1 tablet (4 mg total) by mouth daily. 90 tablet 1   ketoconazole (NIZORAL) 2 % cream Apply to the feet QHS 60 g 3   loratadine (CLARITIN) 10 MG tablet Take 1 tablet (10 mg total) by mouth daily. 30 tablet 11   lovastatin (MEVACOR) 20 MG tablet Take 1 tablet (20 mg total) by mouth at bedtime. 90 tablet 0   metoprolol succinate (TOPROL-XL) 25 MG 24 hr tablet Take 1 tablet (25 mg total) by mouth  daily. 90 tablet 0   Multiple Vitamins-Minerals (MULTIVITAMIN WITH MINERALS) tablet Take 1 tablet by mouth daily.     mupirocin ointment (BACTROBAN) 2 % Apply 1 application topically daily. With dressing changes 22 g 0   Omega-3 Fatty Acids (FISH OIL) 500 MG CAPS Take 500 mg by mouth.     TURMERIC CURCUMIN PO Take 550 mg by mouth.     vitamin B-12 (CYANOCOBALAMIN) 1000 MCG tablet Take 1 tablet (1,000 mcg total) by mouth daily. 90 tablet 1   No current facility-administered medications for this visit.     PHYSICAL EXAMINATION: ECOG PERFORMANCE STATUS: 0 - Asymptomatic Vitals:   11/02/22 1138  BP: 124/65  Pulse: 72  Resp: (!) 100  Temp: (!) 96.5 F (35.8 C)   There were no vitals filed for this visit.   Physical Exam Constitutional:      General: He is not in acute distress. HENT:     Head: Normocephalic and atraumatic.  Eyes:     General: No scleral icterus. Cardiovascular:     Rate and Rhythm: Normal rate and regular rhythm.     Heart sounds: Normal heart sounds.  Pulmonary:     Effort: Pulmonary effort is normal. No respiratory distress.     Breath sounds: No wheezing.  Abdominal:     General: Bowel sounds are normal. There is no distension.     Palpations: Abdomen is soft.  Musculoskeletal:        General: No deformity. Normal range of motion.     Cervical back: Normal range of motion and neck supple.  Skin:    General: Skin is warm and dry.     Findings: No erythema or rash.  Neurological:     Mental Status: He is alert and oriented to person, place, and time. Mental status is at baseline.     Cranial Nerves: No cranial nerve deficit.     Coordination: Coordination normal.  Psychiatric:        Mood and Affect: Mood normal.       LABORATORY DATA:  I have reviewed the data as listed  Latest Ref Rng & Units 10/30/2022   10:57 AM 04/30/2022    9:08 AM 11/02/2021   11:26 AM  CBC  WBC 4.0 - 10.5 K/uL 5.3  5.4  5.5   Hemoglobin 13.0 - 17.0 g/dL 12.3  12.2   11.8   Hematocrit 39.0 - 52.0 % 36.0  36.0  34.2   Platelets 150 - 400 K/uL 182  171  204       Latest Ref Rng & Units 10/30/2022   10:57 AM 04/30/2022    9:08 AM 11/02/2021   11:26 AM  CMP  Glucose 70 - 99 mg/dL 136  103  141   BUN 8 - 23 mg/dL '18  15  16   '$ Creatinine 0.61 - 1.24 mg/dL 0.99  1.03  0.81   Sodium 135 - 145 mmol/L 141  145  136   Potassium 3.5 - 5.1 mmol/L 4.0  4.7  3.7   Chloride 98 - 111 mmol/L 105  107  104   CO2 22 - 32 mmol/L '27  22  25   '$ Calcium 8.9 - 10.3 mg/dL 8.7  9.3  8.6   Total Protein 6.5 - 8.1 g/dL 6.4  6.4  6.6   Total Bilirubin 0.3 - 1.2 mg/dL 1.3  1.3  1.0   Alkaline Phos 38 - 126 U/L 70   70   AST 15 - 41 U/L '24  18  22   '$ ALT 0 - 44 U/L '19  18  20    '$ RADIOGRAPHIC STUDIES: I have personally reviewed the radiological images as listed and agreed with the findings in the report. No results found.

## 2022-11-03 NOTE — Assessment & Plan Note (Signed)
B12 level is normal.  Recommend patient to continue B12 1024mg supplementation 2-3 times per week.

## 2022-11-03 NOTE — Assessment & Plan Note (Addendum)
Hemoglobin level is stable. Monitor.

## 2022-11-05 ENCOUNTER — Telehealth: Payer: Self-pay

## 2022-11-05 NOTE — Telephone Encounter (Signed)
Called patient to advise him that Dr. Nehemiah Massed got his note and photo that he sent in the mail.  Dr. Raliegh Ip reviewed it and it looks like the seborrheic keratosis that he froze on his left low back, although improved, there appears to be some remaining. This is a benign growth and if it bothers him he can make an appointment to retreat with freezing with liquid nitrogen.  If it does not bother him we will simply check it when he comes back next year and treat it then if it is bothering him.

## 2022-11-05 NOTE — Telephone Encounter (Signed)
Called patient to advise him that Dr. Nehemiah Massed got his note and photo that he sent in the mail.  Dr. Raliegh Ip reviewed it and it looks like the seborrheic keratosis that he froze on his left low back, although improved, there appears to be some remaining. This is a benign growth and if it bothers him he can make an appointment to retreat with freezing with liquid nitrogen.  If it does not bother him we will simply check it when he comes back next year and treat it then if it is bothering him.   Lurlean Horns., RMA

## 2022-12-13 ENCOUNTER — Ambulatory Visit
Admission: RE | Admit: 2022-12-13 | Discharge: 2022-12-13 | Disposition: A | Discharge status: Home / Self Care | Payer: Medicare HMO | Source: Ambulatory Visit | Attending: Radiation Oncology | Admitting: Radiation Oncology

## 2022-12-13 ENCOUNTER — Encounter: Payer: Self-pay | Admitting: Radiation Oncology

## 2022-12-13 VITALS — BP 128/73 | HR 86 | Temp 98.3°F | Resp 16 | Ht 69.0 in | Wt 147.4 lb

## 2022-12-13 DIAGNOSIS — C61 Malignant neoplasm of prostate: Secondary | ICD-10-CM | POA: Diagnosis not present

## 2022-12-13 DIAGNOSIS — Z191 Hormone sensitive malignancy status: Secondary | ICD-10-CM | POA: Diagnosis not present

## 2022-12-13 NOTE — Progress Notes (Signed)
Radiation Oncology Follow up Note  Name: Mathew Martinez   Date:   12/13/2022 MRN:  [id number] DOB: Feb 26, 1931    This 89 y.o. male presents  to the clinic today for 3-year follow-up status post IMRT radiation therapy to his prostate and pelvic nodes for Gleason 8 (4+4) adenocarcinoma presenting with a PSA of 11.4.  REFERRING PROVIDER: Cletis Athens, MD  HPI: Patient is a 89 year old male now out over 3 years having completed IMRT radiation therapy along with ADT therapy for Gleason 8 adenocarcinoma the prostate presents with a PSA of 11.4 seen today in routine follow-up he is doing fairly well still continues to have some urinary incontinence which is slight intermittent intermittent and is being addressed by urology.  This dates back multiple years.  His most recent PSA is less than 0.01 showing excellent biochemical control of his prostate cancer..  COMPLICATIONS OF TREATMENT: none  FOLLOW UP COMPLIANCE: keeps appointments   PHYSICAL EXAM:  BP 128/73 (BP Location: Right Arm, Patient Position: Sitting, Cuff Size: Normal)   Pulse 86   Temp 98.3 F (36.8 C) (Tympanic)   Resp 16   Ht 5' 9"$  (1.753 m) Comment: stated HT  Wt 147 lb 6.4 oz (66.9 kg)   BMI 21.77 kg/m  Well-developed well-nourished patient in NAD. HEENT reveals PERLA, EOMI, discs not visualized.  Oral cavity is clear. No oral mucosal lesions are identified. Neck is clear without evidence of cervical or supraclavicular adenopathy. Lungs are clear to A&P. Cardiac examination is essentially unremarkable with regular rate and rhythm without murmur rub or thrill. Abdomen is benign with no organomegaly or masses noted. Motor sensory and DTR levels are equal and symmetric in the upper and lower extremities. Cranial nerves II through XII are grossly intact. Proprioception is intact. No peripheral adenopathy or edema is identified. No motor or sensory levels are noted. Crude visual fields are within normal range.  RADIOLOGY RESULTS: No  current films for review  PLAN: Present time patient is under excellent biochemical control of his prostate cancer.  I will turn follow-up care over to urology patient is turning 59 in April.  Patient is to call with any concerns at any time.  I would like to take this opportunity to thank you for allowing me to participate in the care of your patient.Noreene Filbert, MD

## 2023-02-18 DIAGNOSIS — Z961 Presence of intraocular lens: Secondary | ICD-10-CM | POA: Diagnosis not present

## 2023-02-18 DIAGNOSIS — H18523 Epithelial (juvenile) corneal dystrophy, bilateral: Secondary | ICD-10-CM | POA: Diagnosis not present

## 2023-02-18 DIAGNOSIS — H43813 Vitreous degeneration, bilateral: Secondary | ICD-10-CM | POA: Diagnosis not present

## 2023-02-18 DIAGNOSIS — H35033 Hypertensive retinopathy, bilateral: Secondary | ICD-10-CM | POA: Diagnosis not present

## 2023-03-15 ENCOUNTER — Encounter: Payer: Self-pay | Admitting: Physician Assistant

## 2023-03-15 ENCOUNTER — Ambulatory Visit (INDEPENDENT_AMBULATORY_CARE_PROVIDER_SITE_OTHER): Payer: Medicare HMO | Admitting: Physician Assistant

## 2023-03-15 VITALS — BP 130/57 | HR 72 | Ht 69.0 in | Wt 144.0 lb

## 2023-03-15 DIAGNOSIS — E538 Deficiency of other specified B group vitamins: Secondary | ICD-10-CM

## 2023-03-15 DIAGNOSIS — C61 Malignant neoplasm of prostate: Secondary | ICD-10-CM

## 2023-03-15 DIAGNOSIS — E7800 Pure hypercholesterolemia, unspecified: Secondary | ICD-10-CM

## 2023-03-15 DIAGNOSIS — I10 Essential (primary) hypertension: Secondary | ICD-10-CM | POA: Diagnosis not present

## 2023-03-15 DIAGNOSIS — Z7689 Persons encountering health services in other specified circumstances: Secondary | ICD-10-CM

## 2023-03-15 DIAGNOSIS — R911 Solitary pulmonary nodule: Secondary | ICD-10-CM

## 2023-03-15 DIAGNOSIS — N39498 Other specified urinary incontinence: Secondary | ICD-10-CM | POA: Diagnosis not present

## 2023-03-15 DIAGNOSIS — D649 Anemia, unspecified: Secondary | ICD-10-CM | POA: Diagnosis not present

## 2023-03-15 DIAGNOSIS — Z8546 Personal history of malignant neoplasm of prostate: Secondary | ICD-10-CM

## 2023-03-15 DIAGNOSIS — J301 Allergic rhinitis due to pollen: Secondary | ICD-10-CM

## 2023-03-15 DIAGNOSIS — D696 Thrombocytopenia, unspecified: Secondary | ICD-10-CM

## 2023-03-15 NOTE — Progress Notes (Unsigned)
I,Mathew Martinez,acting as a Neurosurgeon for [employer], PA-C.,have documented all relevant documentation on the behalf of Mathew Lat, PA-C,as directed by  [employer], PA-C while in the presence of [employer], PA-C.   New patient visit   Patient: Mathew Martinez   DOB: 1930-12-08   89 y.o. Male  MRN: [id number] Visit Date: 03/15/2023  Today's healthcare provider: [name], PA-C   No chief complaint on file.  Subjective    Mathew Martinez is a 89 y.o. male who presents today as a new patient to establish care.  HPI  Lost last PCP due to changes being made with insurance Urinary incontinency after hx of prostate cancer radiation treatment velogaurd therapy PSA 0.1  managed by urology latest medication for 2 mo Kegel exercise and dripping improved  Arthritis topical voltaren Seasonal allergies HTN 124/65 65-80  Past Medical History:  Diagnosis Date   Arthritis    wrist   Basal cell carcinoma 02/25/2013   Left mastoid area. Excised, margins free.   Basal cell carcinoma 03/04/2013   Left posterior shoulder. Excised, margins free.   BPH (benign prostatic hyperplasia)    Dysplastic nevus 09/14/2013   Right upper back post. shoulder. Moderate to severe atypia, close to margin.   Dysplastic nevus 09/14/2013   Right sup. popliteal. Moderate atypia, lateral and deep margins involved.   Dysplastic nevus 09/14/2013   Right proximal med. calf. Moderate atypia, deep margin involved.    Dysplastic nevus 09/14/2013   Right distal med. calf. Moderate atypia, lateral and deep margins involved.    Hard of hearing    Heart murmur    History of basal cell carcinoma 02/18/2013   Right posterior wrist. Excised, margins free.   History of melanoma 01/14/2013   left upper back   Hyperlipemia    Hypertension    Prostate cancer ([facility]) 12/29/2019   Past Surgical History:  Procedure Laterality Date   APPENDECTOMY     CATARACT EXTRACTION W/PHACO Right 03/19/2018   Procedure:  CATARACT EXTRACTION PHACO AND INTRAOCULAR LENS PLACEMENT ([facility]) COMPLICATED RIGHT;  Surgeon: Lockie Mola, MD;  Location[facility];  Service: Ophthalmology;  Laterality: Right;  MALYUGIN   CHOLECYSTECTOMY     COLONOSCOPY WITH PROPOFOL N/A 07/15/2015   Procedure: COLONOSCOPY WITH PROPOFOL;  Surgeon: Elnita Maxwell, MD;  Location[facility] END[facility];  Service: Endoscopy;  Laterality: N/A;   Duodenojejunostomy     HERNIA REPAIR     prostate tissue removal  1996   Family Status  Relation Name Status   Mother  Deceased   Father  Deceased   Brother  Deceased   Family History  Problem Relation Age of Onset   Heart disease Mother    Diabetes Mother    Cancer Father    Prostate cancer Brother    Social History   Socioeconomic History   Marital status: Married    Spouse name: Not on file   Number of children: Not on file   Years of education: Not on file   Highest education level: Not on file  Occupational History   Occupation: retired  Tobacco Use   Smoking status: Former    Packs/day: 0.50    Years: 50.00    Additional pack years: 0.00    Total pack years: 25.00    Types: Cigarettes    Quit date: 12/29/1999    Years since quitting: 23.2   Smokeless tobacco: Never  Vaping Use   Vaping Use: Never used  Substance and Sexual  Activity   Alcohol use: Never   Drug use: Never   Sexual activity: Not on file  Other Topics Concern   Not on file  Social History Narrative   Not on file   Social Determinants of Health   Financial Resource Strain: Low Risk  (10/04/2021)   Overall Financial Resource Strain (CARDIA)    Difficulty of Paying Living Expenses: Not hard at all  Food Insecurity: No Food Insecurity (10/04/2021)   Hunger Vital Sign    Worried About Running Out of Food in the Last Year: Never true    Ran Out of Food in the Last Year: Never true  Transportation Needs: No Transportation Needs (10/04/2021)   PRAPARE - Administrator, Civil Service  (Medical): No    Lack of Transportation (Non-Medical): No  Physical Activity: Sufficiently Active (10/04/2021)   Exercise Vital Sign    Days of Exercise per Week: 7 days    Minutes of Exercise per Session: 30 min  Stress: No Stress Concern Present (10/04/2021)   [employer] of Occupational Health - Occupational Stress Questionnaire    Feeling of Stress : Not at all  Social Connections: Moderately Integrated (10/04/2021)   Social Connection and Isolation Panel [NHANES]    Frequency of Communication with Friends and Family: More than three times a week    Frequency of Social Gatherings with Friends and Family: More than three times a week    Attends Religious Services: Never    Database administrator or Organizations: No    Attends Engineer, structural: More than 4 times per year    Marital Status: Married   Outpatient Medications Prior to Visit  Medication Sig   Calcium Carbonate-Vit D-Min (CALCIUM 1200 PO) Take 600 mg by mouth.   Cholecalciferol 25 MCG (1000 UT) tablet Take 1,000 Units by mouth daily.   co-enzyme Q-10 30 MG capsule Take 30 mg by mouth daily.   doxazosin (CARDURA) 4 MG tablet Take 1 tablet (4 mg total) by mouth daily.   ketoconazole (NIZORAL) 2 % cream Apply to the feet QHS   loratadine (CLARITIN) 10 MG tablet Take 1 tablet (10 mg total) by mouth daily.   lovastatin (MEVACOR) 20 MG tablet Take 1 tablet (20 mg total) by mouth at bedtime.   metoprolol succinate (TOPROL-XL) 25 MG 24 hr tablet Take 1 tablet (25 mg total) by mouth daily.   Multiple Vitamins-Minerals (MULTIVITAMIN WITH MINERALS) tablet Take 1 tablet by mouth daily.   mupirocin ointment (BACTROBAN) 2 % Apply 1 application topically daily. With dressing changes   Omega-3 Fatty Acids (FISH OIL) 500 MG CAPS Take 500 mg by mouth.   TURMERIC CURCUMIN PO Take 550 mg by mouth.   vitamin B-12 (CYANOCOBALAMIN) 1000 MCG tablet Take 1 tablet (1,000 mcg total) by mouth daily.   No facility-administered  medications prior to visit.   Allergies  Allergen Reactions   Lisinopril Other (See Comments)    Face Swelling    Immunization History  Administered Date(s) Administered   Covid-19, Mrna,Vaccine(Spikevax)57yrs and older 09/10/2022   Fluad Quad(high Dose 65+) 08/06/2022   Influenza, High Dose Seasonal PF 07/30/2017   Influenza-Unspecified 08/08/2021   PFIZER(Purple Top)SARS-COV-2 Vaccination 10/28/2019, 11/21/2019, 08/02/2020, 03/14/2021, 08/08/2021    Health Maintenance  Topic Date Due   DTaP/Tdap/Td (1 - Tdap) Never done   Zoster Vaccines- Shingrix (1 of 2) Never done   Pneumonia Vaccine 31+ Years old (1 of 1 - PCV) Never done   [employer] (  AWV)  10/03/2022   COVID-19 Vaccine (7 - 2023-24 season) 11/05/2022   INFLUENZA VACCINE  05/23/2023   HPV VACCINES  Aged Out    Patient Care Team: Corky Downs, MD as PCP - General (Internal Medicine) Carmina Miller, MD as Radiation Oncologist (Radiation Oncology)  Review of Systems  HENT:  Positive for congestion.   Genitourinary:  Positive for enuresis.  Musculoskeletal:  Positive for arthralgias.    {Labs  Heme  Chem  Endocrine  Serology  Results Review (optional):23779}   Objective    There were no vitals taken for this visit. {Show previous vital signs (optional):23777}  Physical Exam ***  Depression Screen    01/22/2022   11:12 AM 10/04/2021    3:24 PM 07/05/2021   11:52 AM 06/30/2020   10:40 AM  PHQ 2/9 Scores  PHQ - 2 Score 0 0 0 0   No results found for any visits on 03/15/23.  Assessment & Plan     ***  No follow-ups on file.     The patient was advised to call back or seek an in-person evaluation if the symptoms worsen or if the condition fails to improve as anticipated.  I discussed the assessment and treatment plan with the patient. The patient was provided an opportunity to ask questions and all were answered. The patient agreed with the plan and demonstrated an understanding of the  instructions.  I, Mathew Lat, PA-C have reviewed all documentation for this visit. The documentation on  03/15/23 for the exam, diagnosis, procedures, and orders are all accurate and complete.  Mathew Martinez, [facility], MMS [facility] [phone] (phone) [phone] (fax)   [facility]

## 2023-03-17 ENCOUNTER — Encounter: Payer: Self-pay | Admitting: Physician Assistant

## 2023-03-17 DIAGNOSIS — R32 Unspecified urinary incontinence: Secondary | ICD-10-CM | POA: Insufficient documentation

## 2023-03-17 NOTE — Assessment & Plan Note (Signed)
  Welcomed to our clinic Reviewed past medical hx, social hx, family hx and surgical hx Pt advised to sign a release form for his old records Including his vaccination records Last annual physical exam was done in 2021 Patient is due Medicare annual wellness exam Agreed to have pneumonia vaccine /nurse visit Advised to have Tdap and Shingrix vaccines

## 2023-03-17 NOTE — Assessment & Plan Note (Signed)
Chronic condition - Avoidance measures discussed. - Use nasal saline rinses before nose sprays such as with Neilmed Sinus Rinse bottle.  Use distilled water.   - Use Flonase 2 sprays each nostril daily. Aim upward and outward. - Use Zyrtec 10 mg, Allegra and Claritin OTC daily.

## 2023-03-17 NOTE — Assessment & Plan Note (Signed)
Chronic, s/p radiation for prostate cancer Managed by urology

## 2023-03-17 NOTE — Assessment & Plan Note (Addendum)
Chronic and previously stable Blood pressure goal less than 136/80 His current blood pressure is 136/57 Asymptomatic: No shortness of breath, palpitation, chest pain, leg swelling Advised to continue low-salt, low-cholesterol diet and daily exercise as tolerated Continue metoprolol 25 Mg daily and doxazosin 5 Mg daily Continue lovastatin 20 Mg at bedtime Last labs was done in January 2024 Last lipids panel WNL, LDL cholesterol was 71 Will follow-up in 3 months

## 2023-03-17 NOTE — Assessment & Plan Note (Signed)
Chronic and stable Hemoglobin was stable Monitored by hematology

## 2023-03-17 NOTE — Assessment & Plan Note (Signed)
Resolved on CT Managed by hematologist/oncologist

## 2023-03-17 NOTE — Assessment & Plan Note (Signed)
Managed by hematology oncology. 

## 2023-03-17 NOTE — Assessment & Plan Note (Signed)
Chronic and previously stable Last LDL was 71 in January 2024 Continue to adhere to Mediterranean diet Continue lovastatin 20 Mg daily, at bedtime Will need updated labs Will follow-up in 3 months

## 2023-04-29 ENCOUNTER — Other Ambulatory Visit: Payer: Self-pay | Admitting: Physician Assistant

## 2023-04-29 NOTE — Telephone Encounter (Signed)
Requested medications are due for refill today.  yes  Requested medications are on the active medications list.  yes  Last refill. 06/29/2022 #90 0 rf  Future visit scheduled.   yes  Notes to clinic.  Javed Masoud liste as PCP, and signed rx.    Requested Prescriptions  Pending Prescriptions Disp Refills   metoprolol succinate (TOPROL-XL) 25 MG 24 hr tablet [Pharmacy Med Name: METOPROLOL SUCC ER 25 MG TAB] 90 tablet 0    Sig: TAKE 1 TABLET BY MOUTH DAILY.     Cardiovascular:  Beta Blockers Passed - 04/29/2023 10:44 AM      Passed - Last BP in normal range    BP Readings from Last 1 Encounters:  03/15/23 (!) 130/57         Passed - Last Heart Rate in normal range    Pulse Readings from Last 1 Encounters:  03/15/23 72         Passed - Valid encounter within last 6 months    Recent Outpatient Visits           1 month ago Essential hypertension   [place] [facility] Powers, [place], PA-C       Future Appointments             In 1 month [place], Edmon Crape, PA-C [facility], [facility]   In 4 months Deirdre Evener, MD [facility]

## 2023-06-18 ENCOUNTER — Ambulatory Visit (INDEPENDENT_AMBULATORY_CARE_PROVIDER_SITE_OTHER): Payer: Medicare HMO | Admitting: Physician Assistant

## 2023-06-18 ENCOUNTER — Encounter: Payer: Self-pay | Admitting: Physician Assistant

## 2023-06-18 VITALS — BP 123/54 | HR 81 | Wt 144.0 lb

## 2023-06-18 DIAGNOSIS — Z23 Encounter for immunization: Secondary | ICD-10-CM | POA: Diagnosis not present

## 2023-06-18 DIAGNOSIS — Z0000 Encounter for general adult medical examination without abnormal findings: Secondary | ICD-10-CM

## 2023-06-18 DIAGNOSIS — I10 Essential (primary) hypertension: Secondary | ICD-10-CM

## 2023-06-18 NOTE — Progress Notes (Signed)
Annual Wellness Visit     Patient: Mathew Martinez, Male    DOB: May 12, 1931, 89 y.o.   MRN: [id number] Visit Date: 06/18/2023  Today's Provider: [name], PA-C   Chief Complaint  Patient presents with   Annual Exam: annual wellness and CPE   Subjective    Mathew Martinez is a 89 y.o. male who presents today for his Annual Wellness Visit. Discussed the use of AI scribe software for clinical note transcription with the patient, who gave verbal consent to proceed.  History of Present Illness    The patient is very active, exercises daily, and maintains a healthy diet. He reports that he has not eaten out in a few years and prepares all his meals at home. He is also making efforts to cut down on sodium.        Medications: Outpatient Medications Prior to Visit  Medication Sig   Calcium Carbonate-Vit D-Min (CALCIUM 1200 PO) Take 600 mg by mouth.   Cholecalciferol 25 MCG (1000 UT) tablet Take 1,000 Units by mouth daily.   co-enzyme Q-10 30 MG capsule Take 30 mg by mouth daily.   doxazosin (CARDURA) 4 MG tablet Take 1 tablet (4 mg total) by mouth daily.   ketoconazole (NIZORAL) 2 % cream Apply to the feet QHS   loratadine (CLARITIN) 10 MG tablet Take 1 tablet (10 mg total) by mouth daily.   lovastatin (MEVACOR) 20 MG tablet Take 1 tablet (20 mg total) by mouth at bedtime.   metoprolol succinate (TOPROL-XL) 25 MG 24 hr tablet TAKE 1 TABLET BY MOUTH DAILY.   Multiple Vitamins-Minerals (MULTIVITAMIN WITH MINERALS) tablet Take 1 tablet by mouth daily.   Omega-3 Fatty Acids (FISH OIL) 500 MG CAPS Take 500 mg by mouth.   TURMERIC CURCUMIN PO Take 550 mg by mouth.   vitamin B-12 (CYANOCOBALAMIN) 1000 MCG tablet Take 1 tablet (1,000 mcg total) by mouth daily.   mupirocin ointment (BACTROBAN) 2 % Apply 1 application topically daily. With dressing changes (Patient not taking: Reported on 06/18/2023)   No facility-administered medications prior to visit.    Allergies  Allergen  Reactions   Lisinopril Other (See Comments)    Face Swelling    Patient Care Team: [name], PA-C as PCP - General (Physician Assistant) Carmina Miller, MD as Radiation Oncologist (Radiation Oncology)  Review of Systems  All other systems reviewed and are negative.        Objective    Vitals: BP (!) 123/54 (BP Location: Left Arm, Patient Position: Sitting, Cuff Size: Normal)   Pulse 81   Wt 144 lb (65.3 kg)   SpO2 97%   BMI 21.27 kg/m      Physical Exam Vitals reviewed.  Constitutional:      General: He is not in acute distress.    Appearance: Normal appearance. He is well-developed. He is not ill-appearing, toxic-appearing or diaphoretic.  HENT:     Head: Normocephalic and atraumatic.     Right Ear: Tympanic membrane, ear canal and external ear normal.     Left Ear: Tympanic membrane, ear canal and external ear normal.     Nose: Nose normal. No congestion or rhinorrhea.     Mouth/Throat:     Mouth: Mucous membranes are moist.     Pharynx: Oropharynx is clear. No oropharyngeal exudate or posterior oropharyngeal erythema.  Eyes:     General: No scleral icterus.       Right eye: No discharge.  Left eye: No discharge.     Conjunctiva/sclera: Conjunctivae normal.     Pupils: Pupils are equal, round, and reactive to light.  Neck:     Thyroid: No thyromegaly.     Vascular: No carotid bruit.  Cardiovascular:     Rate and Rhythm: Normal rate and regular rhythm.     Pulses: Normal pulses.     Heart sounds: Normal heart sounds. No murmur heard.    No friction rub. No gallop.  Pulmonary:     Effort: Pulmonary effort is normal. No respiratory distress.     Breath sounds: Normal breath sounds. No stridor. No wheezing, rhonchi or rales.  Chest:     Chest wall: No tenderness.  Abdominal:     General: Abdomen is flat. Bowel sounds are normal. There is no distension.     Palpations: Abdomen is soft. There is no mass.     Tenderness: There is no abdominal  tenderness. There is no right CVA tenderness, left CVA tenderness, guarding or rebound.     Hernia: No hernia is present.  Musculoskeletal:        General: No swelling, tenderness, deformity or signs of injury. Normal range of motion.     Cervical back: Normal range of motion and neck supple. No rigidity or tenderness.     Right lower leg: No edema.     Left lower leg: No edema.  Lymphadenopathy:     Cervical: No cervical adenopathy.  Skin:    General: Skin is warm and dry.     Coloration: Skin is not jaundiced or pale.     Findings: No bruising, erythema, lesion or rash.  Neurological:     Mental Status: He is alert and oriented to person, place, and time. Mental status is at baseline.     Cranial Nerves: No cranial nerve deficit.     Sensory: No sensory deficit.     Motor: No weakness.     Coordination: Coordination normal.     Gait: Gait normal.     Deep Tendon Reflexes: Reflexes normal.  Psychiatric:        Mood and Affect: Mood normal.        Behavior: Behavior normal.        Thought Content: Thought content normal.        Judgment: Judgment normal.      Most recent functional status assessment:    03/15/2023    3:05 PM  In your present state of health, do you have any difficulty performing the following activities:  Hearing? 1  Vision? 0  Difficulty concentrating or making decisions? 0  Walking or climbing stairs? 0  Dressing or bathing? 0  Doing errands, shopping? 0   Most recent fall risk assessment:    03/15/2023    3:05 PM  Fall Risk   Falls in the past year? 0  Number falls in past yr: 0  Injury with Fall? 0  Risk for fall due to : No Fall Risks  Follow up Falls evaluation completed    Most recent depression screenings:    03/15/2023    3:05 PM 01/22/2022   11:12 AM  PHQ 2/9 Scores  PHQ - 2 Score 0 0  PHQ- 9 Score 0    Most recent cognitive screening:    10/04/2021    3:25 PM  6CIT Screen  What Year? 0 points  What month? 0 points  What time?  0 points  Count back from 20 0 points  Months in reverse 0 points  Repeat phrase 0 points  Total Score 0 points   Most recent Audit-C alcohol use screening    03/15/2023    3:04 PM  Alcohol Use Disorder Test (AUDIT)  1. How often do you have a drink containing alcohol? 0  2. How many drinks containing alcohol do you have on a typical day when you are drinking? 0  3. How often do you have six or more drinks on one occasion? 0  AUDIT-C Score 0   A score of 3 or more in women, and 4 or more in men indicates increased risk for alcohol abuse, EXCEPT if all of the points are from question 1   No results found for any visits on 06/18/23.  Assessment & Plan     Annual wellness visit done today including the all of the following: Reviewed patient's Family Medical History Reviewed and updated list of patient's medical providers Assessment of cognitive impairment was done Assessed patient's functional ability Established a written schedule for health screening services Health Risk Assessent Completed and Reviewed  Exercise Activities and Dietary recommendations  Goals   Healthy diet and daily exercise encouraged     Immunization History  Administered Date(s) Administered   Covid-19, Mrna,Vaccine(Spikevax)59yrs and older 09/10/2022   Fluad Quad(high Dose 65+) 08/06/2022   Influenza, High Dose Seasonal PF 07/30/2017   Influenza-Unspecified 08/08/2021   PFIZER(Purple Top)SARS-COV-2 Vaccination 10/28/2019, 11/21/2019, 08/02/2020, 03/14/2021, 08/08/2021   PNEUMOCOCCAL CONJUGATE-20 06/18/2023    Health Maintenance  Topic Date Due   DTaP/Tdap/Td (1 - Tdap) Never done   Zoster Vaccines- Shingrix (1 of 2) Never done   Medicare Annual Wellness (AWV)  10/03/2022   COVID-19 Vaccine (7 - 2023-24 season) 11/05/2022   INFLUENZA VACCINE  01/20/2024 (Originally 05/23/2023)   Pneumonia Vaccine 75+ Years old  Completed   HPV VACCINES  Aged Out     Discussed health benefits of physical  activity, and encouraged him to engage in regular exercise appropriate for his age and condition.    Immunization due Need for pneumococcal vaccine - Pneumococcal conjugate vaccine 20-valent  Physical exam, annual UTD on dental/eye Things to do to keep yourself healthy  - Exercise at least 30-45 minutes a day, 3-4 days a week.  - Eat a low-fat diet with lots of fruits and vegetables, up to 7-9 servings per day.  - Seatbelts can save your life. Wear them always.  - Smoke detectors on every level of your home, check batteries every year.  - Eye Doctor - have an eye exam every 1-2 years  - Safe sex - if you may be exposed to STDs, use a condom.  - Alcohol -  If you drink, do it moderately, less than 2 drinks per day.  - Health Care Power of Attorney. Choose someone to speak for you if you are not able.  - Depression is common in our stressful world.If you're feeling down or losing interest in things you normally enjoy, please come in for a visit.  - Violence - If anyone is threatening or hurting you, please call immediately.    Return in about 4 weeks (around 07/16/2023) for BP f/u.    The patient was advised to call back or seek an in-person evaluation if the symptoms worsen or if the condition fails to improve as anticipated.  I discussed the assessment and treatment plan with the patient. The patient was provided an opportunity to ask questions and all were answered. The patient agreed with  the plan and demonstrated an understanding of the instructions.  I, [name], PA-C have reviewed all documentation for this visit. The documentation on  06/18/23 for the exam, diagnosis, procedures, and orders are all accurate and complete.  [name], [facility], MMS [facility] [phone] (phone) [phone] (fax)    [name], PA-C  [facility] [phone] (phone) [phone] (fax)  [facility]

## 2023-07-16 ENCOUNTER — Ambulatory Visit: Payer: Medicare HMO | Admitting: Physician Assistant

## 2023-07-17 ENCOUNTER — Ambulatory Visit: Payer: Medicare HMO | Admitting: Physician Assistant

## 2023-07-17 ENCOUNTER — Encounter: Payer: Self-pay | Admitting: Physician Assistant

## 2023-07-17 VITALS — BP 137/57 | HR 72 | Wt 143.6 lb

## 2023-07-17 DIAGNOSIS — I10 Essential (primary) hypertension: Secondary | ICD-10-CM | POA: Diagnosis not present

## 2023-07-17 DIAGNOSIS — M19042 Primary osteoarthritis, left hand: Secondary | ICD-10-CM

## 2023-07-17 DIAGNOSIS — M19041 Primary osteoarthritis, right hand: Secondary | ICD-10-CM

## 2023-07-17 NOTE — Progress Notes (Unsigned)
Established patient visit  Patient: Mathew Martinez   DOB: 1931-07-08   89 y.o. Male  MRN: [id number] Visit Date: 07/17/2023  Today's healthcare provider: [name], PA-C   Chief Complaint  Patient presents with   Medical Management of Chronic Issues    4 week hypertension follow-up. Patient reports checking at home and taking medication as prescribed with no side effects or symptoms to report.    Subjective    HPI HPI     Medical Management of Chronic Issues    Additional comments: 4 week hypertension follow-up. Patient reports checking at home and taking medication as prescribed with no side effects or symptoms to report.       Last edited by Acey Lav, CMA on 07/17/2023 10:51 AM.      *** Discussed the use of AI scribe software for clinical note transcription with the patient, who gave verbal consent to proceed.  History of Present Illness               03/15/2023    3:05 PM 01/22/2022   11:12 AM 10/04/2021    3:24 PM  Depression screen PHQ 2/9  Decreased Interest 0 0 0  Down, Depressed, Hopeless 0 0 0  PHQ - 2 Score 0 0 0  Altered sleeping 0    Tired, decreased energy 0    Change in appetite 0    Feeling bad or failure about yourself  0    Trouble concentrating 0    Moving slowly or fidgety/restless 0    Suicidal thoughts 0    PHQ-9 Score 0    Difficult doing work/chores Not difficult at all         No data to display          Medications: Outpatient Medications Prior to Visit  Medication Sig   Calcium Carbonate-Vit D-Min (CALCIUM 1200 PO) Take 600 mg by mouth.   Cholecalciferol 25 MCG (1000 UT) tablet Take 1,000 Units by mouth daily.   co-enzyme Q-10 30 MG capsule Take 30 mg by mouth daily.   doxazosin (CARDURA) 4 MG tablet Take 1 tablet (4 mg total) by mouth daily.   ketoconazole (NIZORAL) 2 % cream Apply to the feet QHS   loratadine (CLARITIN) 10 MG tablet Take 1 tablet (10 mg total) by mouth daily.   lovastatin (MEVACOR) 20 MG  tablet Take 1 tablet (20 mg total) by mouth at bedtime.   metoprolol succinate (TOPROL-XL) 25 MG 24 hr tablet TAKE 1 TABLET BY MOUTH DAILY.   Multiple Vitamins-Minerals (MULTIVITAMIN WITH MINERALS) tablet Take 1 tablet by mouth daily.   Omega-3 Fatty Acids (FISH OIL) 500 MG CAPS Take 500 mg by mouth.   TURMERIC CURCUMIN PO Take 550 mg by mouth.   vitamin B-12 (CYANOCOBALAMIN) 1000 MCG tablet Take 1 tablet (1,000 mcg total) by mouth daily.   No facility-administered medications prior to visit.    Review of Systems Except see HPI   {Insert previous labs (optional):23779} {See past labs  Heme  Chem  Endocrine  Serology  Results Review (optional):1}   Objective    BP (!) 137/57 (BP Location: Left Arm, Patient Position: Sitting, Cuff Size: Normal)   Pulse 72   Wt 143 lb 9.6 oz (65.1 kg)   SpO2 95%   BMI 21.21 kg/m  {Insert last BP/Wt (optional):23777}{See vitals history (optional):1}   Physical Exam   No results found for any visits on 07/17/23.  Assessment & Plan    *** Assessment and Plan  No follow-ups on file.      [facility]

## 2023-09-04 ENCOUNTER — Encounter: Payer: Self-pay | Admitting: Dermatology

## 2023-09-04 ENCOUNTER — Ambulatory Visit: Payer: Medicare HMO | Admitting: Dermatology

## 2023-09-04 DIAGNOSIS — Z85828 Personal history of other malignant neoplasm of skin: Secondary | ICD-10-CM

## 2023-09-04 DIAGNOSIS — Z1283 Encounter for screening for malignant neoplasm of skin: Secondary | ICD-10-CM

## 2023-09-04 DIAGNOSIS — D229 Melanocytic nevi, unspecified: Secondary | ICD-10-CM | POA: Diagnosis not present

## 2023-09-04 DIAGNOSIS — Z86018 Personal history of other benign neoplasm: Secondary | ICD-10-CM | POA: Diagnosis not present

## 2023-09-04 DIAGNOSIS — Z85820 Personal history of malignant melanoma of skin: Secondary | ICD-10-CM

## 2023-09-04 DIAGNOSIS — L578 Other skin changes due to chronic exposure to nonionizing radiation: Secondary | ICD-10-CM | POA: Diagnosis not present

## 2023-09-04 DIAGNOSIS — L814 Other melanin hyperpigmentation: Secondary | ICD-10-CM | POA: Diagnosis not present

## 2023-09-04 NOTE — Progress Notes (Signed)
   Follow-Up Visit   Subjective  Mathew Martinez is a 89 y.o. male who presents for the following: Skin Cancer Screening and Full Body Skin Exam  The patient presents for Total-Body Skin Exam (TBSE) for skin cancer screening and mole check. The patient has spots, moles and lesions to be evaluated, some may be new or changing and the patient may have concern these could be cancer.    The following portions of the chart were reviewed this encounter and updated as appropriate: medications, allergies, medical history  Review of Systems:  No other skin or systemic complaints except as noted in HPI or Assessment and Plan.  Objective  Well appearing patient in no apparent distress; mood and affect are within normal limits.  A full examination was performed including scalp, head, eyes, ears, nose, lips, neck, chest, axillae, abdomen, back, buttocks, bilateral upper extremities, bilateral lower extremities, hands, feet, fingers, toes, fingernails, and toenails. All findings within normal limits unless otherwise noted below.   Relevant physical exam findings are noted in the Assessment and Plan.    Assessment & Plan   SKIN CANCER SCREENING PERFORMED TODAY.  ACTINIC DAMAGE - Chronic condition, secondary to cumulative UV/sun exposure - diffuse scaly erythematous macules with underlying dyspigmentation - Recommend daily broad spectrum sunscreen SPF 30+ to sun-exposed areas, reapply every 2 hours as needed.  - Staying in the shade or wearing long sleeves, sun glasses (UVA+UVB protection) and wide brim hats (4-inch brim around the entire circumference of the hat) are also recommended for sun protection.  - Call for new or changing lesions.  LENTIGINES, SEBORRHEIC KERATOSES, HEMANGIOMAS - Benign normal skin lesions - Benign-appearing - Call for any changes  MELANOCYTIC NEVI - R post shoulder near the axilla, flesh colored papule. - Tan-brown and/or pink-flesh-colored symmetric macules and  papules - Benign appearing on exam today - Observation - Call clinic for new or changing moles - Recommend daily use of broad spectrum spf 30+ sunscreen to sun-exposed areas.   HISTORY OF BASAL CELL CARCINOMA OF THE SKIN - No evidence of recurrence today - Recommend regular full body skin exams - Recommend daily broad spectrum sunscreen SPF 30+ to sun-exposed areas, reapply every 2 hours as needed.  - Call if any new or changing lesions are noted between office visits  HISTORY OF DYSPLASTIC NEVUS No evidence of recurrence today Recommend regular full body skin exams Recommend daily broad spectrum sunscreen SPF 30+ to sun-exposed areas, reapply every 2 hours as needed.  Call if any new or changing lesions are noted between office visits  HISTORY OF MELANOMA - L upper back  - No evidence of recurrence today - No lymphadenopathy - Recommend regular full body skin exams - Recommend daily broad spectrum sunscreen SPF 30+ to sun-exposed areas, reapply every 2 hours as needed.  - Call if any new or changing lesions are noted between office visits   Return in about 1 year (around 09/03/2024) for TBSE.  Maylene Roes, CMA, am acting as scribe for Armida Sans, MD .   Documentation: I have reviewed the above documentation for accuracy and completeness, and I agree with the above.  Armida Sans, MD

## 2023-09-04 NOTE — Patient Instructions (Addendum)

## 2023-09-11 ENCOUNTER — Encounter: Payer: Self-pay | Admitting: Dermatology

## 2023-10-04 ENCOUNTER — Other Ambulatory Visit: Payer: Self-pay | Admitting: Physician Assistant

## 2023-10-04 NOTE — Telephone Encounter (Signed)
Requested medication (s) are due for refill today: Yes  Requested medication (s) are on the active medication list: Yes  Last refill:  01/24/22  Future visit scheduled: Yes  Notes to clinic:  Unable to refill per protocol, last refill by another provider.      Requested Prescriptions  Pending Prescriptions Disp Refills   doxazosin (CARDURA) 4 MG tablet [Pharmacy Med Name: DOXAZOSIN MESYLATE 4 MG TAB] 90 tablet 0    Sig: Take 1 tablet (4 mg total) by mouth daily.     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

## 2023-10-10 NOTE — Progress Notes (Unsigned)
Established patient visit  Patient: Mathew Martinez   DOB: 01-Jan-1931   89 y.o. Male  MRN: [id number] Visit Date: 10/11/2023  Today's healthcare provider: [name], PA-C   No chief complaint on file.  Subjective       Discussed the use of AI scribe software for clinical note transcription with the patient, who gave verbal consent to proceed.  History of Present Illness     Hypertension, follow-up  BP Readings from Last 3 Encounters:  10/11/23 136/65  07/17/23 (!) 137/57  06/18/23 (!) 123/54   Wt Readings from Last 3 Encounters:  10/11/23 143 lb 12.8 oz (65.2 kg)  07/17/23 143 lb 9.6 oz (65.1 kg)  06/18/23 144 lb (65.3 kg)     He was last seen for hypertension 3 months ago.  BP at that visit was 137/57. Management since that visit includes ***.  He reports good compliance with treatment. He is not having side effects. {document side effects if present:1} He is following a Low Sodium diet. He is exercising. Cycling arm soulder leg exercise 30' every days He does not smoke.  6 glassess of water tea  A cup of coffee  Outside blood pressures are  Symptoms: {Yes/No:20286} chest pain {Yes/No:20286} chest pressure  {Yes/No:20286} palpitations {Yes/No:20286} syncope  {Yes/No:20286} dyspnea {Yes/No:20286} orthopnea  {Yes/No:20286} paroxysmal nocturnal dyspnea {Yes/No:20286} lower extremity edema   Pertinent labs Lab Results  Component Value Date   CHOL 136 04/30/2022   HDL 45 04/30/2022   LDLCALC 71 04/30/2022   TRIG 123 04/30/2022   CHOLHDL 3.0 04/30/2022   Lab Results  Component Value Date   NA 141 10/30/2022   K 4.0 10/30/2022   CREATININE 0.99 10/30/2022   GFRNONAA >60 10/30/2022   GLUCOSE 136 (H) 10/30/2022   TSH 2.90 04/30/2022     The ASCVD Risk score (Arnett DK, et al., 2019) failed to calculate for the following reasons:   The 2019 ASCVD risk score is only valid for ages 64 to  29  ---------------------------------------------------------------------------------------------------    137 and 124 Metoprolol am and dozazosin pm Healthy diet CBC? Abnormal,  glucose and ca 10/2022     03/15/2023    3:05 PM 01/22/2022   11:12 AM 10/04/2021    3:24 PM  Depression screen PHQ 2/9  Decreased Interest 0 0 0  Down, Depressed, Hopeless 0 0 0  PHQ - 2 Score 0 0 0  Altered sleeping 0    Tired, decreased energy 0    Change in appetite 0    Feeling bad or failure about yourself  0    Trouble concentrating 0    Moving slowly or fidgety/restless 0    Suicidal thoughts 0    PHQ-9 Score 0    Difficult doing work/chores Not difficult at all         No data to display          Medications: Outpatient Medications Prior to Visit  Medication Sig   Calcium Carbonate-Vit D-Min (CALCIUM 1200 PO) Take 600 mg by mouth.   Cholecalciferol 25 MCG (1000 UT) tablet Take 1,000 Units by mouth daily.   co-enzyme Q-10 30 MG capsule Take 30 mg by mouth daily.   doxazosin (CARDURA) 4 MG tablet Take 1 tablet (4 mg total) by mouth daily.   ketoconazole (NIZORAL) 2 % cream Apply to the feet QHS   loratadine (CLARITIN) 10 MG tablet Take 1 tablet (10 mg total) by mouth daily.   lovastatin (MEVACOR) 20 MG tablet Take 1  tablet (20 mg total) by mouth at bedtime.   metoprolol succinate (TOPROL-XL) 25 MG 24 hr tablet TAKE 1 TABLET BY MOUTH DAILY.   Multiple Vitamins-Minerals (MULTIVITAMIN WITH MINERALS) tablet Take 1 tablet by mouth daily.   Omega-3 Fatty Acids (FISH OIL) 500 MG CAPS Take 500 mg by mouth.   TURMERIC CURCUMIN PO Take 550 mg by mouth.   vitamin B-12 (CYANOCOBALAMIN) 1000 MCG tablet Take 1 tablet (1,000 mcg total) by mouth daily.   No facility-administered medications prior to visit.    Review of Systems  All other systems reviewed and are negative.  All negative Except see HPI   {Insert previous labs (optional):23779} {See past labs  Heme  Chem  Endocrine  Serology   Results Review (optional):1}   Objective    There were no vitals taken for this visit. {Insert last BP/Wt (optional):23777}{See vitals history (optional):1}   Physical Exam Vitals reviewed.  Constitutional:      General: He is not in acute distress.    Appearance: Normal appearance. He is not diaphoretic.  HENT:     Head: Normocephalic and atraumatic.  Eyes:     General: No scleral icterus.    Conjunctiva/sclera: Conjunctivae normal.  Cardiovascular:     Rate and Rhythm: Normal rate and regular rhythm.     Pulses: Normal pulses.     Heart sounds: Normal heart sounds. No murmur heard. Pulmonary:     Effort: Pulmonary effort is normal. No respiratory distress.     Breath sounds: Normal breath sounds. No wheezing or rhonchi.  Musculoskeletal:     Cervical back: Neck supple.     Right lower leg: No edema.     Left lower leg: No edema.  Lymphadenopathy:     Cervical: No cervical adenopathy.  Skin:    General: Skin is warm and dry.     Findings: No rash.  Neurological:     Mental Status: He is alert and oriented to person, place, and time. Mental status is at baseline.  Psychiatric:        Mood and Affect: Mood normal.        Behavior: Behavior normal.      No results found for any visits on 10/11/23.      Assessment and Plan  Bedtime and 11 am 9 am No orders of the defined types were placed in this encounter.   No follow-ups on file.   The patient was advised to call back or seek an in-person evaluation if the symptoms worsen or if the condition fails to improve as anticipated.  I discussed the assessment and treatment plan with the patient. The patient was provided an opportunity to ask questions and all were answered. The patient agreed with the plan and demonstrated an understanding of the instructions.  I, [name], PA-C have reviewed all documentation for this visit. The documentation on 10/11/2023  for the exam, diagnosis, procedures, and orders are all  accurate and complete.  [name], [facility], MMS [facility] [phone] (phone) [phone] (fax)  [facility]

## 2023-10-11 ENCOUNTER — Ambulatory Visit (INDEPENDENT_AMBULATORY_CARE_PROVIDER_SITE_OTHER): Payer: Medicare HMO | Admitting: Physician Assistant

## 2023-10-11 ENCOUNTER — Encounter: Payer: Self-pay | Admitting: Physician Assistant

## 2023-10-11 VITALS — BP 136/65 | HR 74 | Wt 143.8 lb

## 2023-10-11 DIAGNOSIS — E7849 Other hyperlipidemia: Secondary | ICD-10-CM | POA: Diagnosis not present

## 2023-10-11 DIAGNOSIS — I10 Essential (primary) hypertension: Secondary | ICD-10-CM

## 2023-10-11 DIAGNOSIS — M19041 Primary osteoarthritis, right hand: Secondary | ICD-10-CM

## 2023-10-11 DIAGNOSIS — M19042 Primary osteoarthritis, left hand: Secondary | ICD-10-CM

## 2023-10-29 ENCOUNTER — Other Ambulatory Visit: Payer: Self-pay | Admitting: Physician Assistant

## 2023-11-01 ENCOUNTER — Inpatient Hospital Stay: Payer: Medicare HMO | Attending: Oncology

## 2023-11-01 DIAGNOSIS — C61 Malignant neoplasm of prostate: Secondary | ICD-10-CM | POA: Diagnosis not present

## 2023-11-01 DIAGNOSIS — Z87891 Personal history of nicotine dependence: Secondary | ICD-10-CM | POA: Diagnosis not present

## 2023-11-01 DIAGNOSIS — Z79899 Other long term (current) drug therapy: Secondary | ICD-10-CM | POA: Diagnosis not present

## 2023-11-01 DIAGNOSIS — D649 Anemia, unspecified: Secondary | ICD-10-CM | POA: Insufficient documentation

## 2023-11-01 LAB — COMPREHENSIVE METABOLIC PANEL
ALT: 18 U/L (ref 0–44)
AST: 26 U/L (ref 15–41)
Albumin: 4.2 g/dL (ref 3.5–5.0)
Alkaline Phosphatase: 60 U/L (ref 38–126)
Anion gap: 11 (ref 5–15)
BUN: 15 mg/dL (ref 8–23)
CO2: 21 mmol/L — ABNORMAL LOW (ref 22–32)
Calcium: 9 mg/dL (ref 8.9–10.3)
Chloride: 105 mmol/L (ref 98–111)
Creatinine, Ser: 0.93 mg/dL (ref 0.61–1.24)
GFR, Estimated: >60 mL/min (ref >60)
Glucose, Bld: 166 mg/dL — ABNORMAL HIGH (ref 70–99)
Potassium: 4.1 mmol/L (ref 3.5–5.1)
Sodium: 137 mmol/L (ref 135–145)
Total Bilirubin: 1.7 mg/dL — ABNORMAL HIGH (ref 0.0–1.2)
Total Protein: 6.9 g/dL (ref 6.5–8.1)

## 2023-11-01 LAB — CBC WITH DIFFERENTIAL/PLATELET
Abs Immature Granulocytes: 0.04 10*3/uL (ref 0.00–0.07)
Basophils Absolute: 0 10*3/uL (ref 0.0–0.1)
Basophils Relative: 0 %
Eosinophils Absolute: 0.1 10*3/uL (ref 0.0–0.5)
Eosinophils Relative: 1 %
HCT: 34.9 % — ABNORMAL LOW (ref 39.0–52.0)
Hemoglobin: 11.8 g/dL — ABNORMAL LOW (ref 13.0–17.0)
Immature Granulocytes: 1 %
Lymphocytes Relative: 19 %
Lymphs Abs: 1.3 10*3/uL (ref 0.7–4.0)
MCH: 32.1 pg (ref 26.0–34.0)
MCHC: 33.8 g/dL (ref 30.0–36.0)
MCV: 94.8 fL (ref 80.0–100.0)
Monocytes Absolute: 0.4 10*3/uL (ref 0.1–1.0)
Monocytes Relative: 6 %
Neutro Abs: 5 10*3/uL (ref 1.7–7.7)
Neutrophils Relative %: 73 %
Platelets: 185 10*3/uL (ref 150–400)
RBC: 3.68 MIL/uL — ABNORMAL LOW (ref 4.22–5.81)
RDW: 13 % (ref 11.5–15.5)
WBC: 6.9 10*3/uL (ref 4.0–10.5)
nRBC: 0 % (ref 0.0–0.2)

## 2023-11-01 LAB — VITAMIN B12: Vitamin B-12: 714 pg/mL (ref 180–914)

## 2023-11-01 LAB — IRON AND TIBC
Iron: 100 ug/dL (ref 45–182)
Saturation Ratios: 28 % (ref 17.9–39.5)
TIBC: 360 ug/dL (ref 250–450)
UIBC: 260 ug/dL

## 2023-11-01 LAB — FERRITIN: Ferritin: 98 ng/mL (ref 24–336)

## 2023-11-02 LAB — PSA: Prostatic Specific Antigen: <0.01 ng/mL (ref 0.00–4.00)

## 2023-11-04 ENCOUNTER — Encounter: Payer: Self-pay | Admitting: Oncology

## 2023-11-04 ENCOUNTER — Inpatient Hospital Stay: Payer: Medicare HMO | Admitting: Oncology

## 2023-11-04 VITALS — BP 114/74 | HR 64 | Temp 97.8°F | Resp 18 | Wt 144.3 lb

## 2023-11-04 DIAGNOSIS — D649 Anemia, unspecified: Secondary | ICD-10-CM | POA: Diagnosis not present

## 2023-11-04 DIAGNOSIS — C61 Malignant neoplasm of prostate: Secondary | ICD-10-CM

## 2023-11-04 DIAGNOSIS — E538 Deficiency of other specified B group vitamins: Secondary | ICD-10-CM

## 2023-11-04 DIAGNOSIS — Z87891 Personal history of nicotine dependence: Secondary | ICD-10-CM | POA: Diagnosis not present

## 2023-11-04 DIAGNOSIS — Z79899 Other long term (current) drug therapy: Secondary | ICD-10-CM | POA: Diagnosis not present

## 2023-11-04 NOTE — Progress Notes (Signed)
 Hematology/Oncology follow up note [phone] [phone]   Patient Care Team: Ostwalt, Janna, PA-C as PCP - General (Physician Assistant) Lenn Aran, MD as Radiation Oncologist (Radiation Oncology) Babara Call, MD as Consulting Physician (Oncology)  REFERRING PROVIDER: Britta King, MD  CHIEF COMPLAINTS/REASON FOR VISIT:  Follow-up for prostate cancer  ASSESSMENT & PLAN:   Prostate cancer [facility]) T2bN0M0 prostate cancer was discussed. #Risk stratification Grade group 4/Gleason 8 (4+4), PSA 15,  high risk group.   S/p prostate radiation iron recommend with androgen deprivation therapy [01/21/20-11/03/2021]  Labs are reviewed and discussed with patient.  PSA is stably <0.01 Close monitor.  Recommend follow up annually with PSA monitoring. Patient elects to follow up with PCP.   Normocytic anemia Normal iron panel, B12.  Monitor.   B12 deficiency B12 level is normal.  Recommend patient to continue B12 1000mcg supplementation 2-3 times per week.   No orders of the defined types were placed in this encounter.  Follow up  PRN All questions were answered. The patient knows to call the clinic with any problems, questions or concerns.  Call Babara, MD, PhD [facility] Hematology Oncology 11/04/2023   HISTORY OF PRESENTING ILLNESS:  Mathew Martinez is a  89 y.o.  male presents for follow up of prostate cancer  INTERVAL HISTORY Mathew Martinez is a 89 y.o. male who has above history reviewed by me today presents for follow up visit for management of prostate cancer Reports feeling well. No new complaints.  Mild urinary leakage/urgency.    Review of Systems  Constitutional:  Negative for appetite change, chills, fatigue, fever and unexpected weight change.  HENT:   Negative for hearing loss and voice change.   Eyes:  Negative for eye problems and icterus.  Respiratory:  Negative for chest tightness, cough and shortness of breath.   Cardiovascular:   Negative for chest pain and leg swelling.  Gastrointestinal:  Negative for abdominal distention and abdominal pain.  Endocrine: Negative for hot flashes.  Genitourinary:  Negative for difficulty urinating, dysuria and frequency.        Urinary incontinence  Musculoskeletal:  Negative for arthralgias.  Skin:  Negative for itching and rash.  Neurological:  Negative for light-headedness and numbness.  Hematological:  Negative for adenopathy. Does not bruise/bleed easily.  Psychiatric/Behavioral:  Negative for confusion.     MEDICAL HISTORY:  Past Medical History:  Diagnosis Date   Allergy    Arthritis    wrist   Basal cell carcinoma 02/25/2013   Left mastoid area. Excised, margins free.   Basal cell carcinoma 03/04/2013   Left posterior shoulder. Excised, margins free.   BPH (benign prostatic hyperplasia)    Dysplastic nevus 09/14/2013   Right upper back post. shoulder. Moderate to severe atypia, close to margin.   Dysplastic nevus 09/14/2013   Right sup. popliteal. Moderate atypia, lateral and deep margins involved.   Dysplastic nevus 09/14/2013   Right proximal med. calf. Moderate atypia, deep margin involved.    Dysplastic nevus 09/14/2013   Right distal med. calf. Moderate atypia, lateral and deep margins involved.    Hard of hearing    Heart murmur    History of basal cell carcinoma 02/18/2013   Right posterior wrist. Excised, margins free.   History of melanoma 01/14/2013   left upper back   Hyperlipemia    Hypertension    Melanoma (HCC) 01/14/2013   L upper back   Prostate cancer (HCC) 12/29/2019    SURGICAL HISTORY: Past Surgical History:  Procedure Laterality Date   APPENDECTOMY     CATARACT EXTRACTION W/PHACO Right 03/19/2018   Procedure: CATARACT EXTRACTION PHACO AND INTRAOCULAR LENS PLACEMENT (IOC) COMPLICATED RIGHT;  Surgeon: Mittie Gaskin, MD;  Location: [facility];  Service: Ophthalmology;  Laterality: Right;  MALYUGIN   CHOLECYSTECTOMY      COLONOSCOPY WITH PROPOFOL  N/A 07/15/2015   Procedure: COLONOSCOPY WITH PROPOFOL ;  Surgeon: Donnice Vaughn Manes, MD;  Location: [facility];  Service: Endoscopy;  Laterality: N/A;   Duodenojejunostomy     EYE SURGERY     FRACTURE SURGERY     HERNIA REPAIR     PROSTATE SURGERY     prostate tissue removal  1996   VASECTOMY      SOCIAL HISTORY: Social History   Socioeconomic History   Marital status: Married    Spouse name: Not on file   Number of children: Not on file   Years of education: Not on file   Highest education level: Not on file  Occupational History   Occupation: retired  Tobacco Use   Smoking status: Former    Current packs/day: 0.00    Average packs/day: 0.5 packs/day for 50.0 years (25.0 ttl pk-yrs)    Types: Cigarettes    Start date: 12/28/1949    Quit date: 12/29/1999    Years since quitting: 23.8   Smokeless tobacco: Never  Vaping Use   Vaping status: Never Used  Substance and Sexual Activity   Alcohol use: Never   Drug use: Never   Sexual activity: Not on file  Other Topics Concern   Not on file  Social History Narrative   Not on file   Social Drivers of Health   Financial Resource Strain: Low Risk  (10/04/2021)   Overall Financial Resource Strain (CARDIA)    Difficulty of Paying Living Expenses: Not hard at all  Food Insecurity: No Food Insecurity (10/04/2021)   Hunger Vital Sign    Worried About Running Out of Food in the Last Year: Never true    Ran Out of Food in the Last Year: Never true  Transportation Needs: No Transportation Needs (10/04/2021)   PRAPARE - Administrator, Civil Service (Medical): No    Lack of Transportation (Non-Medical): No  Physical Activity: Sufficiently Active (10/04/2021)   Exercise Vital Sign    Days of Exercise per Week: 7 days    Minutes of Exercise per Session: 30 min  Stress: No Stress Concern Present (10/04/2021)   [employer] of Occupational Health - Occupational Stress Questionnaire     Feeling of Stress : Not at all  Social Connections: Moderately Integrated (10/04/2021)   Social Connection and Isolation Panel [NHANES]    Frequency of Communication with Friends and Family: More than three times a week    Frequency of Social Gatherings with Friends and Family: More than three times a week    Attends Religious Services: Never    Database Administrator or Organizations: No    Attends Engineer, Structural: More than 4 times per year    Marital Status: Married  Catering Manager Violence: Not At Risk (10/04/2021)   Humiliation, Afraid, Rape, and Kick questionnaire    Fear of Current or Ex-Partner: No    Emotionally Abused: No    Physically Abused: No    Sexually Abused: No    FAMILY HISTORY: Family History  Problem Relation Age of Onset   Heart disease Mother    Diabetes Mother    Cancer Father  Prostate cancer Brother    Heart attack Maternal Grandfather     ALLERGIES:  is allergic to lisinopril.  MEDICATIONS:  Current Outpatient Medications  Medication Sig Dispense Refill   Calcium Carbonate-Vit D-Min (CALCIUM 1200 PO) Take 600 mg by mouth.     Cholecalciferol 25 MCG (1000 UT) tablet Take 1,000 Units by mouth daily.     co-enzyme Q-10 30 MG capsule Take 30 mg by mouth daily.     doxazosin  (CARDURA ) 4 MG tablet Take 1 tablet (4 mg total) by mouth daily. 90 tablet 0   ketoconazole  (NIZORAL ) 2 % cream Apply to the feet QHS 60 g 3   loratadine  (CLARITIN ) 10 MG tablet Take 1 tablet (10 mg total) by mouth daily. 30 tablet 11   lovastatin  (MEVACOR ) 20 MG tablet Take 1 tablet (20 mg total) by mouth at bedtime. 90 tablet 0   metoprolol  succinate (TOPROL -XL) 25 MG 24 hr tablet TAKE 1 TABLET BY MOUTH DAILY. 90 tablet 0   Multiple Vitamins-Minerals (MULTIVITAMIN WITH MINERALS) tablet Take 1 tablet by mouth daily.     Omega-3 Fatty Acids (FISH OIL) 500 MG CAPS Take 500 mg by mouth.     TURMERIC CURCUMIN PO Take 550 mg by mouth.     vitamin B-12  (CYANOCOBALAMIN ) 1000 MCG tablet Take 1 tablet (1,000 mcg total) by mouth daily. 90 tablet 1   No current facility-administered medications for this visit.     PHYSICAL EXAMINATION: ECOG PERFORMANCE STATUS: 0 - Asymptomatic Vitals:   11/04/23 1318  BP: 114/74  Pulse: 64  Resp: 18  Temp: 97.8 F (36.6 C)   Filed Weights   11/04/23 1318  Weight: 144 lb 4.8 oz (65.5 kg)    Physical Exam Constitutional:      General: He is not in acute distress. HENT:     Head: Normocephalic and atraumatic.  Eyes:     General: No scleral icterus. Cardiovascular:     Rate and Rhythm: Normal rate and regular rhythm.     Heart sounds: Normal heart sounds.  Pulmonary:     Effort: Pulmonary effort is normal. No respiratory distress.     Breath sounds: No wheezing.  Abdominal:     General: Bowel sounds are normal. There is no distension.     Palpations: Abdomen is soft.  Musculoskeletal:        General: No deformity. Normal range of motion.     Cervical back: Normal range of motion and neck supple.  Skin:    General: Skin is warm and dry.     Findings: No erythema or rash.  Neurological:     Mental Status: He is alert and oriented to person, place, and time. Mental status is at baseline.     Cranial Nerves: No cranial nerve deficit.     Coordination: Coordination normal.  Psychiatric:        Mood and Affect: Mood normal.       LABORATORY DATA:  I have reviewed the data as listed    Latest Ref Rng & Units 11/01/2023   10:03 AM 10/30/2022   10:57 AM 04/30/2022    9:08 AM  CBC  WBC 4.0 - 10.5 K/uL 6.9  5.3  5.4   Hemoglobin 13.0 - 17.0 g/dL 88.1  87.6  87.7   Hematocrit 39.0 - 52.0 % 34.9  36.0  36.0   Platelets 150 - 400 K/uL 185  182  171       Latest Ref Rng & Units 11/01/2023  10:03 AM 10/30/2022   10:57 AM 04/30/2022    9:08 AM  CMP  Glucose 70 - 99 mg/dL 833  863  896   BUN 8 - 23 mg/dL 15  18  15    Creatinine 0.61 - 1.24 mg/dL 9.06  9.00  8.96   Sodium 135 - 145 mmol/L  137  141  145   Potassium 3.5 - 5.1 mmol/L 4.1  4.0  4.7   Chloride 98 - 111 mmol/L 105  105  107   CO2 22 - 32 mmol/L 21  27  22    Calcium 8.9 - 10.3 mg/dL 9.0  8.7  9.3   Total Protein 6.5 - 8.1 g/dL 6.9  6.4  6.4   Total Bilirubin 0.0 - 1.2 mg/dL 1.7  1.3  1.3   Alkaline Phos 38 - 126 U/L 60  70    AST 15 - 41 U/L 26  24  18    ALT 0 - 44 U/L 18  19  18     RADIOGRAPHIC STUDIES: I have personally reviewed the radiological images as listed and agreed with the findings in the report. No results found.

## 2023-11-04 NOTE — Assessment & Plan Note (Addendum)
 T2bN0M0 prostate cancer was discussed. #Risk stratification Grade group 4/Gleason 8 (4+4), PSA 15,  high risk group.   S/p prostate radiation iron recommend with androgen deprivation therapy [01/21/20-11/03/2021]  Labs are reviewed and discussed with patient.  PSA is stably <0.01 Close monitor.  Recommend follow up annually with PSA monitoring. Patient elects to follow up with PCP.

## 2023-11-04 NOTE — Assessment & Plan Note (Addendum)
 Normal iron panel, B12.  Monitor.

## 2023-11-04 NOTE — Assessment & Plan Note (Signed)
B12 level is normal.  Recommend patient to continue B12 1024mg supplementation 2-3 times per week.

## 2023-11-05 ENCOUNTER — Encounter: Payer: Self-pay | Admitting: Physician Assistant

## 2023-11-08 NOTE — Telephone Encounter (Signed)
Pt already scheduled appt 11/22/23

## 2023-11-22 ENCOUNTER — Ambulatory Visit: Payer: Medicare HMO | Admitting: Physician Assistant

## 2023-12-02 ENCOUNTER — Encounter: Payer: Self-pay | Admitting: Physician Assistant

## 2023-12-23 NOTE — Progress Notes (Unsigned)
 Established patient visit  Patient: Mathew Martinez   DOB: [birth year]   89 y.o. Male  MRN: [id number] Visit Date: 12/24/2023  Today's healthcare provider: [name], PA-C   No chief complaint on file.  Subjective       Discussed the use of AI scribe software for clinical note transcription with the patient, who gave verbal consent to proceed.  History of Present Illness         Anemia bs increased      10/11/2023   10:49 AM 03/15/2023    3:05 PM 01/22/2022   11:12 AM  Depression screen PHQ 2/9  Decreased Interest 0 0 0  Down, Depressed, Hopeless 0 0 0  PHQ - 2 Score 0 0 0  Altered sleeping  0   Tired, decreased energy  0   Change in appetite  0   Feeling bad or failure about yourself   0   Trouble concentrating  0   Moving slowly or fidgety/restless  0   Suicidal thoughts  0   PHQ-9 Score  0   Difficult doing work/chores  Not difficult at all       10/11/2023   10:49 AM  GAD 7 : Generalized Anxiety Score  Nervous, Anxious, on Edge 0  Control/stop worrying 0  Worry too much - different things 0  Trouble relaxing 0  Restless 0  Easily annoyed or irritable 0  Afraid - awful might happen 0  Total GAD 7 Score 0  Anxiety Difficulty Not difficult at all    Medications: Outpatient Medications Prior to Visit  Medication Sig   Calcium Carbonate-Vit D-Min (CALCIUM 1200 PO) Take 600 mg by mouth.   Cholecalciferol 25 MCG (1000 UT) tablet Take 1,000 Units by mouth daily.   co-enzyme Q-10 30 MG capsule Take 30 mg by mouth daily.   doxazosin (CARDURA) 4 MG tablet Take 1 tablet (4 mg total) by mouth daily.   ketoconazole (NIZORAL) 2 % cream Apply to the feet QHS   loratadine (CLARITIN) 10 MG tablet Take 1 tablet (10 mg total) by mouth daily.   lovastatin (MEVACOR) 20 MG tablet Take 1 tablet (20 mg total) by mouth at bedtime.   metoprolol succinate (TOPROL-XL) 25 MG 24 hr tablet TAKE 1 TABLET BY MOUTH DAILY.   Multiple Vitamins-Minerals (MULTIVITAMIN WITH MINERALS)  tablet Take 1 tablet by mouth daily.   Omega-3 Fatty Acids (FISH OIL) 500 MG CAPS Take 500 mg by mouth.   TURMERIC CURCUMIN PO Take 550 mg by mouth.   vitamin B-12 (CYANOCOBALAMIN) 1000 MCG tablet Take 1 tablet (1,000 mcg total) by mouth daily.   No facility-administered medications prior to visit.    Review of Systems  All other systems reviewed and are negative.  All negative Except see HPI   {Insert previous labs (optional):23779} {See past labs  Heme  Chem  Endocrine  Serology  Results Review (optional):1}   Objective    There were no vitals taken for this visit. {Insert last BP/Wt (optional):23777}{See vitals history (optional):1}   Physical Exam Vitals reviewed.  Constitutional:      General: He is not in acute distress.    Appearance: Normal appearance. He is not diaphoretic.  HENT:     Head: Normocephalic and atraumatic.  Eyes:     General: No scleral icterus.    Conjunctiva/sclera: Conjunctivae normal.  Cardiovascular:     Rate and Rhythm: Normal rate and regular rhythm.     Pulses: Normal pulses.  Heart sounds: Normal heart sounds. No murmur heard. Pulmonary:     Effort: Pulmonary effort is normal. No respiratory distress.     Breath sounds: Normal breath sounds. No wheezing or rhonchi.  Musculoskeletal:     Cervical back: Neck supple.     Right lower leg: No edema.     Left lower leg: No edema.  Lymphadenopathy:     Cervical: No cervical adenopathy.  Skin:    General: Skin is warm and dry.     Findings: No rash.  Neurological:     Mental Status: He is alert and oriented to person, place, and time. Mental status is at baseline.  Psychiatric:        Mood and Affect: Mood normal.        Behavior: Behavior normal.      No results found for any visits on 12/24/23.      Assessment and Plan             No orders of the defined types were placed in this encounter.   No follow-ups on file.   The patient was advised to call back or seek  an in-person evaluation if the symptoms worsen or if the condition fails to improve as anticipated.  I discussed the assessment and treatment plan with the patient. The patient was provided an opportunity to ask questions and all were answered. The patient agreed with the plan and demonstrated an understanding of the instructions.  I, [name], PA-C have reviewed all documentation for this visit. The documentation on 12/24/2023  for the exam, diagnosis, procedures, and orders are all accurate and complete.  [name], [facility], MMS [facility] [phone] (phone) [phone] (fax)  [facility]

## 2023-12-24 ENCOUNTER — Ambulatory Visit (INDEPENDENT_AMBULATORY_CARE_PROVIDER_SITE_OTHER): Payer: Medicare HMO | Admitting: Physician Assistant

## 2023-12-24 ENCOUNTER — Encounter: Payer: Self-pay | Admitting: Physician Assistant

## 2023-12-24 VITALS — BP 120/62 | HR 66 | Ht 69.0 in | Wt 142.7 lb

## 2023-12-24 DIAGNOSIS — C61 Malignant neoplasm of prostate: Secondary | ICD-10-CM

## 2023-12-24 DIAGNOSIS — D649 Anemia, unspecified: Secondary | ICD-10-CM

## 2023-12-24 DIAGNOSIS — R399 Unspecified symptoms and signs involving the genitourinary system: Secondary | ICD-10-CM

## 2023-12-24 DIAGNOSIS — E7800 Pure hypercholesterolemia, unspecified: Secondary | ICD-10-CM

## 2023-12-24 DIAGNOSIS — I10 Essential (primary) hypertension: Secondary | ICD-10-CM | POA: Diagnosis not present

## 2023-12-24 DIAGNOSIS — R739 Hyperglycemia, unspecified: Secondary | ICD-10-CM | POA: Diagnosis not present

## 2023-12-24 DIAGNOSIS — E7849 Other hyperlipidemia: Secondary | ICD-10-CM

## 2023-12-24 DIAGNOSIS — M19041 Primary osteoarthritis, right hand: Secondary | ICD-10-CM

## 2023-12-24 LAB — POCT GLYCOSYLATED HEMOGLOBIN (HGB A1C): Hemoglobin A1C: 5.7 % — AB (ref 4.0–5.6)

## 2023-12-25 DIAGNOSIS — R739 Hyperglycemia, unspecified: Secondary | ICD-10-CM | POA: Insufficient documentation

## 2024-01-07 ENCOUNTER — Other Ambulatory Visit: Payer: Self-pay | Admitting: Physician Assistant

## 2024-01-08 ENCOUNTER — Other Ambulatory Visit: Payer: Self-pay | Admitting: Physician Assistant

## 2024-01-08 DIAGNOSIS — I10 Essential (primary) hypertension: Secondary | ICD-10-CM

## 2024-01-08 MED ORDER — DOXAZOSIN MESYLATE 4 MG PO TABS: 4.0000 mg | ORAL_TABLET | Freq: Every day | ORAL | 0 refills | Status: DC

## 2024-01-08 NOTE — Telephone Encounter (Signed)
 Disp Refills Start End   doxazosin (CARDURA) 4 MG tablet 90 tablet 0 01/08/2024 --   Sig - Route: Take 1 tablet (4 mg total) by mouth daily. - Oral   Sent to pharmacy as: doxazosin (CARDURA) 4 MG tablet   E-Prescribing Status: Receipt confirmed by pharmacy (01/08/2024  8:33 AM EDT)    Requested Prescriptions  Pending Prescriptions Disp Refills   doxazosin (CARDURA) 4 MG tablet [Pharmacy Med Name: DOXAZOSIN MESYLATE 4 MG TAB] 90 tablet 0    Sig: Take 1 tablet (4 mg total) by mouth daily.     There is no refill protocol information for this order

## 2024-01-10 ENCOUNTER — Ambulatory Visit: Payer: Medicare HMO | Admitting: Physician Assistant

## 2024-01-14 NOTE — Addendum Note (Signed)
 Addended by: Liz Beach on: 01/14/2024 03:21 PM   Modules accepted: Orders

## 2024-01-29 ENCOUNTER — Other Ambulatory Visit: Payer: Self-pay | Admitting: Internal Medicine

## 2024-02-01 ENCOUNTER — Other Ambulatory Visit: Payer: Self-pay | Admitting: Physician Assistant

## 2024-02-03 NOTE — Telephone Encounter (Signed)
 Requested Prescriptions  Pending Prescriptions Disp Refills   metoprolol succinate (TOPROL-XL) 25 MG 24 hr tablet [Pharmacy Med Name: METOPROLOL SUCC ER 25 MG TAB] 90 tablet 0    Sig: TAKE 1 TABLET BY MOUTH DAILY.     Cardiovascular:  Beta Blockers Passed - 02/03/2024  1:07 PM      Passed - Last BP in normal range    BP Readings from Last 1 Encounters:  12/24/23 120/62         Passed - Last Heart Rate in normal range    Pulse Readings from Last 1 Encounters:  12/24/23 66         Passed - Valid encounter within last 6 months    Recent Outpatient Visits           1 month ago Essential hypertension   [facility] [place], [place], PA-C       Future Appointments             In 7 months Mathew Halter, MD [facility]

## 2024-02-04 ENCOUNTER — Other Ambulatory Visit: Payer: Self-pay | Admitting: Physician Assistant

## 2024-02-04 NOTE — Telephone Encounter (Signed)
 Copied from CRM [id number]. Topic: Clinical - Medication Refill >> Feb 04, 2024  9:13 AM Marlan Silva wrote: Most Recent Primary Care Visit:  Provider: OSTWALT, JANNA  Department: [facility]  Visit Type: OFFICE VISIT  Date: 12/24/2023  Medication: lovastatin (MEVACOR) 20 MG tablet  Has the patient contacted their pharmacy? Yes (Agent: If no, request that the patient contact the pharmacy for the refill. If patient does not wish to contact the pharmacy document the reason why and proceed with request.) (Agent: If yes, when and what did the pharmacy advise?)  Is this the correct pharmacy for this prescription? Yes If no, delete pharmacy and type the correct one.  This is the patient's preferred pharmacy:  [facility] - [place], [place] - [address] [place] [address] Phone: [phone] Fax: [phone]   Has the prescription been filled recently? No  Is the patient out of the medication? Yes  Has the patient been seen for an appointment in the last year OR does the patient have an upcoming appointment? Yes  Can we respond through MyChart? Yes  Agent: Please be advised that Rx refills may take up to 3 business days. We ask that you follow-up with your pharmacy.

## 2024-02-05 MED ORDER — LOVASTATIN 20 MG PO TABS: 20.0000 mg | ORAL_TABLET | Freq: Every day | ORAL | 0 refills | Status: DC

## 2024-02-05 NOTE — Telephone Encounter (Signed)
 Requested medication (s) are due for refill today: yes  Requested medication (s) are on the active medication list: yes  Last refill:  06/07/22  Future visit scheduled: yes  Notes to clinic:  Unable to refill per protocol, last refill by another provider.      Requested Prescriptions  Pending Prescriptions Disp Refills   lovastatin (MEVACOR) 20 MG tablet 90 tablet 0    Sig: Take 1 tablet (20 mg total) by mouth at bedtime.     Cardiovascular:  Antilipid - Statins 2 Failed - 02/05/2024 10:22 AM      Failed - Lipid Panel in normal range within the last 12 months    Cholesterol  Date Value Ref Range Status  04/30/2022 136 <200 mg/dL Final   LDL Cholesterol (Calc)  Date Value Ref Range Status  04/30/2022 71 mg/dL (calc) Final    Comment:    Reference range: <100 . Desirable range <100 mg/dL for primary prevention;   <70 mg/dL for patients with CHD or diabetic patients  with > or = 2 CHD risk factors. Aaron Aas LDL-C is now calculated using the Martin-Hopkins  calculation, which is a validated novel method providing  better accuracy than the Friedewald equation in the  estimation of LDL-C.  Melinda Sprawls et al. Erroll Heard. 9147;829(56): 2061-2068  (http://education.[facility].com/faq/FAQ164)    HDL  Date Value Ref Range Status  04/30/2022 45 > OR = 40 mg/dL Final   Triglycerides  Date Value Ref Range Status  04/30/2022 123 <150 mg/dL Final         Passed - Cr in normal range and within 360 days    Creat  Date Value Ref Range Status  04/30/2022 1.03 0.70 - 1.22 mg/dL Final   Creatinine, Ser  Date Value Ref Range Status  11/01/2023 0.93 0.61 - 1.24 mg/dL Final         Passed - Patient is not pregnant      Passed - Valid encounter within last 12 months    Recent Outpatient Visits           1 month ago Essential hypertension   [facility] [place], Monalisa Angles, PA-C       Future Appointments             In 7 months Elta Halter, MD [facility]

## 2024-03-02 ENCOUNTER — Other Ambulatory Visit: Payer: Self-pay | Admitting: Physician Assistant

## 2024-03-29 ENCOUNTER — Other Ambulatory Visit: Payer: Self-pay | Admitting: Physician Assistant

## 2024-03-30 ENCOUNTER — Encounter: Payer: Self-pay | Admitting: Physician Assistant

## 2024-03-30 ENCOUNTER — Ambulatory Visit (INDEPENDENT_AMBULATORY_CARE_PROVIDER_SITE_OTHER): Admitting: Physician Assistant

## 2024-03-30 VITALS — BP 133/74 | HR 74 | Resp 16 | Ht 69.0 in | Wt 143.0 lb

## 2024-03-30 DIAGNOSIS — R399 Unspecified symptoms and signs involving the genitourinary system: Secondary | ICD-10-CM

## 2024-03-30 DIAGNOSIS — Z08 Encounter for follow-up examination after completed treatment for malignant neoplasm: Secondary | ICD-10-CM

## 2024-03-30 DIAGNOSIS — R739 Hyperglycemia, unspecified: Secondary | ICD-10-CM

## 2024-03-30 DIAGNOSIS — C61 Malignant neoplasm of prostate: Secondary | ICD-10-CM

## 2024-03-30 DIAGNOSIS — D649 Anemia, unspecified: Secondary | ICD-10-CM

## 2024-03-30 DIAGNOSIS — E7849 Other hyperlipidemia: Secondary | ICD-10-CM | POA: Diagnosis not present

## 2024-03-30 DIAGNOSIS — I10 Essential (primary) hypertension: Secondary | ICD-10-CM | POA: Diagnosis not present

## 2024-03-30 DIAGNOSIS — Z8546 Personal history of malignant neoplasm of prostate: Secondary | ICD-10-CM

## 2024-03-30 NOTE — Progress Notes (Signed)
 Established patient visit  Patient: Mathew Martinez   DOB: 02/02/1931   89 y.o. Male  MRN: [id number] Visit Date: 03/30/2024  Today's healthcare provider: Blane Bunting, PA-C   Chief Complaint  Patient presents with   Follow-up    F/u on HTN , (PSA test needed)    Subjective     HPI     Follow-up    Additional comments: F/u on HTN , (PSA test needed)       Last edited by Estill Hemming, CMA on 03/30/2024 10:41 AM.       Discussed the use of AI scribe software for clinical note transcription with the patient, who gave verbal consent to proceed.  History of Present Illness Mathew Martinez is a 89 year old male with hypertension who presents with concerns about blood pressure readings.  He experiences elevated blood pressure readings during clinic visits, while home measurements are consistently lower. He measures his blood pressure twice weekly at home. He takes Cardura  4 mg and Metoprolol  25 mg, with Metoprolol  in the morning and Dioxazine Mesylate in the evening. He follows a low-salt diet and maintains regular exercise and sleep routines. He also takes a 20 mg cholesterol medication.  He has prediabetes with a recent A1c test indicating levels slightly above normal. He experiences slight urination issues, including occasional dribbling, attributed to past radiation treatment. He has anemia with a hemoglobin level of 11.8, which he considers normal for him, and reports good energy levels despite quicker fatigue.  He denies anxiety, chest pain, shortness of breath, rapid heart rate, or blood in stool.       03/30/2024   11:36 AM 10/11/2023   10:49 AM 03/15/2023    3:05 PM  Depression screen PHQ 2/9  Decreased Interest 0 0 0  Down, Depressed, Hopeless 0 0 0  PHQ - 2 Score 0 0 0  Altered sleeping 0  0  Tired, decreased energy 0  0  Change in appetite 0  0  Feeling bad or failure about yourself  0  0  Trouble concentrating 0  0  Moving slowly or fidgety/restless 0  0   Suicidal thoughts 0  0  PHQ-9 Score 0  0  Difficult doing work/chores Not difficult at all  Not difficult at all      03/30/2024   11:36 AM 10/11/2023   10:49 AM  GAD 7 : Generalized Anxiety Score  Nervous, Anxious, on Edge 0 0  Control/stop worrying 0 0  Worry too much - different things 0 0  Trouble relaxing 0 0  Restless 0 0  Easily annoyed or irritable 0 0  Afraid - awful might happen 0 0  Total GAD 7 Score 0 0  Anxiety Difficulty Not difficult at all Not difficult at all    Medications: Outpatient Medications Prior to Visit  Medication Sig   Calcium Carbonate-Vit D-Min (CALCIUM 1200 PO) Take 600 mg by mouth.   Cholecalciferol 25 MCG (1000 UT) tablet Take 1,000 Units by mouth daily.   co-enzyme Q-10 30 MG capsule Take 30 mg by mouth daily.   doxazosin  (CARDURA ) 4 MG tablet Take 1 tablet (4 mg total) by mouth daily.   ketoconazole  (NIZORAL ) 2 % cream Apply to the feet QHS   loratadine  (CLARITIN ) 10 MG tablet Take 1 tablet (10 mg total) by mouth daily.   lovastatin  (MEVACOR ) 20 MG tablet Take 1 tablet (20 mg total) by mouth at bedtime.   metoprolol  succinate (TOPROL -XL) 25 MG 24  hr tablet TAKE 1 TABLET BY MOUTH DAILY.   Multiple Vitamins-Minerals (MULTIVITAMIN WITH MINERALS) tablet Take 1 tablet by mouth daily.   Omega-3 Fatty Acids (FISH OIL) 500 MG CAPS Take 500 mg by mouth.   TURMERIC CURCUMIN PO Take 550 mg by mouth.   vitamin B-12 (CYANOCOBALAMIN ) 1000 MCG tablet Take 1 tablet (1,000 mcg total) by mouth daily.   No facility-administered medications prior to visit.    Review of Systems All negative Except see HPI       Objective    BP 133/74 (BP Location: Right Arm, Patient Position: Sitting, Cuff Size: Normal)   Pulse 74   Resp 16   Ht 5\' 9"  (1.753 m)   Wt 143 lb (64.9 kg)   SpO2 97%   BMI 21.12 kg/m     Physical Exam Vitals reviewed.  Constitutional:      General: He is not in acute distress.    Appearance: Normal appearance. He is not  diaphoretic.  HENT:     Head: Normocephalic and atraumatic.  Eyes:     General: No scleral icterus.    Conjunctiva/sclera: Conjunctivae normal.  Cardiovascular:     Rate and Rhythm: Normal rate and regular rhythm.     Pulses: Normal pulses.     Heart sounds: Normal heart sounds. No murmur heard. Pulmonary:     Effort: Pulmonary effort is normal. No respiratory distress.     Breath sounds: Normal breath sounds. No wheezing or rhonchi.  Musculoskeletal:     Cervical back: Neck supple.     Right lower leg: No edema.     Left lower leg: No edema.  Lymphadenopathy:     Cervical: No cervical adenopathy.  Skin:    General: Skin is warm and dry.     Findings: No rash.  Neurological:     Mental Status: He is alert and oriented to person, place, and time. Mental status is at baseline.  Psychiatric:        Mood and Affect: Mood normal.        Behavior: Behavior normal.      No results found for any visits on 03/30/24.      Assessment & Plan Hypertension Possible white coat hypertension with elevated clinic readings. Medication regimen appropriate. - Continue Cardura  4 mg and metoprolol  25 mg. - Monitor blood pressure at home regularly. Continue low-salt salt diet, DASH diet and staying active  Hyperlipidemia Chronic and previously stable  on cholesterol-lowering medication. Lipid levels to be checked routinely. - Order lipid panel during the next visit.  Patient prefers to do labs with annual wellness/ physical exam  Prediabetes Chronic  Last A1c in low prediabetes range. - Continue monitoring A1c as per routine schedule. Continue low-carb diet/check American diabetic Association website for more information on low-carb diet  Anemia Chronic anemia with stable hemoglobin level of 11.8. - Monitor hemoglobin levels annually. Managed by hematology/oncology  Prostate cancer, in remission  prostate Cancer Surveillance/every 6 months Urinary symptoms Concern about PSA  levels. - Order PSA test today. Will follow-up   Orders Placed This Encounter  Procedures   PSA Total (Reflex To Free)    No follow-ups on file.   The patient was advised to call back or seek an in-person evaluation if the symptoms worsen or if the condition fails to improve as anticipated.  I discussed the assessment and treatment plan with the patient. The patient was provided an opportunity to ask questions and all were answered. The patient agreed  with the plan and demonstrated an understanding of the instructions.  I, Sabien Umland, PA-C have reviewed all documentation for this visit. The documentation on 03/30/2024  for the exam, diagnosis, procedures, and orders are all accurate and complete.  Blane Bunting, [facility], MMS [facility] [phone] (phone) [phone] (fax)  [facility]

## 2024-03-31 ENCOUNTER — Ambulatory Visit: Payer: Self-pay | Admitting: Physician Assistant

## 2024-03-31 LAB — PSA TOTAL (REFLEX TO FREE): Prostate Specific Ag, Serum: <0.1 ng/mL (ref 0.0–4.0)

## 2024-04-16 ENCOUNTER — Other Ambulatory Visit: Payer: Self-pay | Admitting: Physician Assistant

## 2024-04-16 DIAGNOSIS — I10 Essential (primary) hypertension: Secondary | ICD-10-CM

## 2024-04-28 ENCOUNTER — Other Ambulatory Visit: Payer: Self-pay | Admitting: Physician Assistant

## 2024-04-29 ENCOUNTER — Other Ambulatory Visit: Payer: Self-pay | Admitting: Physician Assistant

## 2024-04-30 ENCOUNTER — Other Ambulatory Visit: Payer: Self-pay | Admitting: Physician Assistant

## 2024-06-15 ENCOUNTER — Ambulatory Visit: Admitting: Dermatology

## 2024-06-15 DIAGNOSIS — Z7189 Other specified counseling: Secondary | ICD-10-CM

## 2024-06-15 DIAGNOSIS — S90221A Contusion of right lesser toe(s) with damage to nail, initial encounter: Secondary | ICD-10-CM

## 2024-06-15 DIAGNOSIS — L603 Nail dystrophy: Secondary | ICD-10-CM | POA: Diagnosis not present

## 2024-06-15 NOTE — Progress Notes (Unsigned)
   Follow-Up Visit   Subjective  Mathew Martinez is a 89 y.o. male who presents for the following: patient here regarding history of trauma while doing yard work several months ago to his middle toe on right foot. Noticed some redness under nail.   The following portions of the chart were reviewed this encounter and updated as appropriate: medications, allergies, medical history  Review of Systems:  No other skin or systemic complaints except as noted in HPI or Assessment and Plan.  Objective  Well appearing patient in no apparent distress; mood and affect are within normal limits.   A focused examination was performed of the following areas: Right foot middle toe  Relevant exam findings are noted in the Assessment and Plan.  Nail dystrophy at right foot    Trauma at right 3rd toenail   Scaling at bottom right foot       right 2nd, 3rd, 4th, and 5th toenails See photos   Assessment & Plan   Trauma at right 3rd toenail With subungual blood Exam: see photos Blood observed under proximal 3rd toenail of right foot No hutchinson signs  Treatment Plan: Benign. Observe Do not recommended any treatment Discussed could take up to 12 months to heal  Reassured.  Check next visit  NAIL DYSTROPHY right 2nd, 3rd, 4th, and 5th toenails Exam:  Observed in 2nd, 3rd, 4th and 5th but no signs of fungus No scale of feet  Right great toenail appears clear of fungus or dystrophy  Recommendation:  Benign Do not recommend treatment Recheck next visit.    reassured  SUBUNGUAL HEMATOMA OF TOENAIL OF RIGHT FOOT, INITIAL ENCOUNTER   COUNSELING AND COORDINATION OF CARE    Return for keep follow up as scheduled .  IEleanor Blush, CMA, am acting as scribe for Alm Rhyme, MD.   Documentation: I have reviewed the above documentation for accuracy and completeness, and I agree with the above.  Alm Rhyme, MD

## 2024-06-15 NOTE — Patient Instructions (Signed)
 Do not recommend treatment today  Caused from trauma  Could take up to 12 months to heal   Due to recent changes in healthcare laws, you may see results of your pathology and/or laboratory studies on MyChart before the doctors have had a chance to review them. We understand that in some cases there may be results that are confusing or concerning to you. Please understand that not all results are received at the same time and often the doctors may need to interpret multiple results in order to provide you with the best plan of care or course of treatment. Therefore, we ask that you please give us  2 business days to thoroughly review all your results before contacting the office for clarification. Should we see a critical lab result, you will be contacted sooner.   If You Need Anything After Your Visit  If you have any questions or concerns for your doctor, please call our main line at [phone] and press option 4 to reach your doctor's medical assistant. If no one answers, please leave a voicemail as directed and we will return your call as soon as possible. Messages left after 4 pm will be answered the following business day.   You may also send us  a message via MyChart. We typically respond to MyChart messages within 1-2 business days.  For prescription refills, please ask your pharmacy to contact our office. Our fax number is [phone].  If you have an urgent issue when the clinic is closed that cannot wait until the next business day, you can page your doctor at the number below.    Please note that while we do our best to be available for urgent issues outside of office hours, we are not available 24/7.   If you have an urgent issue and are unable to reach us , you may choose to seek medical care at your doctor's office, retail clinic, urgent care center, or emergency room.  If you have a medical emergency, please immediately call 911 or go to the emergency department.  Pager  Numbers  - Dr. Hester: [phone]  - Dr. Jackquline: [phone]  - Dr. Claudene: [phone]   - Dr. Raymund: [phone]  In the event of inclement weather, please call our main line at [phone] for an update on the status of any delays or closures.  Dermatology Medication Tips: Please keep the boxes that topical medications come in in order to help keep track of the instructions about where and how to use these. Pharmacies typically print the medication instructions only on the boxes and not directly on the medication tubes.   If your medication is too expensive, please contact our office at [phone] option 4 or send us  a message through MyChart.   We are unable to tell what your co-pay for medications will be in advance as this is different depending on your insurance coverage. However, we may be able to find a substitute medication at lower cost or fill out paperwork to get insurance to cover a needed medication.   If a prior authorization is required to get your medication covered by your insurance company, please allow us  1-2 business days to complete this process.  Drug prices often vary depending on where the prescription is filled and some pharmacies may offer cheaper prices.  The website www.goodrx.com contains coupons for medications through different pharmacies. The prices here do not account for what the cost may be with help from insurance (it may be cheaper with your insurance), but  the website can give you the price if you did not use any insurance.  - You can print the associated coupon and take it with your prescription to the pharmacy.  - You may also stop by our office during regular business hours and pick up a GoodRx coupon card.  - If you need your prescription sent electronically to a different pharmacy, notify our office through [facility] or by phone at [phone] option 4.     Si Usted Necesita Algo Despus de Su Visita  Tambin puede  enviarnos un mensaje a travs de Clinical cytogeneticist. Por lo general respondemos a los mensajes de MyChart en el transcurso de 1 a 2 das hbiles.  Para renovar recetas, por favor pida a su farmacia que se ponga en contacto con nuestra oficina. Randi lakes de fax es [place] [phone].  Si tiene un asunto urgente cuando la clnica est cerrada y que no puede esperar hasta el siguiente da hbil, puede llamar/localizar a su doctor(a) al nmero que aparece a continuacin.   Por favor, tenga en cuenta que aunque hacemos todo lo posible para estar disponibles para asuntos urgentes fuera del horario de [place], no estamos disponibles las 24 horas del da, los 7 [address]  [address] de la [place].   Si tiene un problema urgente y no puede comunicarse con nosotros, puede optar por buscar atencin mdica  en el consultorio de su doctor(a), en una clnica privada, en un centro de atencin urgente o en una sala de emergencias.  Si tiene Engineer, drilling, por favor llame inmediatamente al 911 o vaya a la sala de emergencias.  Nmeros de bper  - Dr. Hester: [phone]  - Dra. Jackquline: [phone]  - Dr. Claudene: [phone]  - Dra. Kitts: [phone]  En caso de inclemencias del [place], por favor llame a nuestra lnea principal al [phone] para una actualizacin sobre el estado de cualquier retraso o cierre.  Consejos para la medicacin en dermatologa: Por favor, guarde las cajas en las que vienen los medicamentos de uso tpico para ayudarle a seguir las instrucciones sobre dnde y cmo usarlos. Las farmacias generalmente imprimen las instrucciones del medicamento slo en las cajas y no directamente en los tubos del [place].   Si su medicamento es muy caro, por favor, pngase en contacto con landry rieger llamando al [phone] y presione la opcin 4 o envenos un mensaje a travs de Clinical cytogeneticist.   No podemos decirle cul ser su copago por los medicamentos por adelantado ya que esto es diferente dependiendo  de la cobertura de su seguro. Sin embargo, es posible que podamos encontrar un medicamento sustituto a Audiological scientist un formulario para que el seguro cubra el medicamento que se considera necesario.   Si se requiere una autorizacin previa para que su compaa de seguros malta su medicamento, por favor permtanos de 1 a 2 das hbiles para completar este proceso.  Los precios de los medicamentos varan con frecuencia dependiendo del Environmental consultant de dnde se surte la receta y alguna farmacias pueden ofrecer precios ms baratos.  El sitio web www.goodrx.com tiene cupones para medicamentos de Health and safety inspector. Los precios aqu no tienen en cuenta lo que podra costar con la ayuda del seguro (puede ser ms barato con su seguro), pero el sitio web puede darle el precio si no utiliz Tourist information centre manager.  - Puede imprimir el cupn correspondiente y llevarlo con su receta a la farmacia.  - Tambin puede pasar por nuestra oficina durante el horario de atencin regular y Education officer, museum  una tarjeta de cupones de GoodRx.  - Si necesita que su receta se enve electrnicamente a una farmacia diferente, informe a nuestra oficina a travs de MyChart de [place] o por telfono llamando al [phone] y presione la opcin 4.

## 2024-06-16 ENCOUNTER — Encounter: Payer: Self-pay | Admitting: Dermatology

## 2024-06-17 NOTE — Progress Notes (Signed)
 Established patient visit  Patient: Mathew Martinez   DOB: March 28, 1931   89 y.o. Male  MRN: [id number] Visit Date: 06/18/2024  Today's healthcare provider: Jolynn Spencer, PA-C   Chief Complaint  Patient presents with   Medical Management of Chronic Issues    Patient present for follow up of chronic conditions. Reports feeling well and no acute concerns today.     Subjective     HPI     Medical Management of Chronic Issues    Additional comments: Patient present for follow up of chronic conditions. Reports feeling well and no acute concerns today.        Last edited by Cherry Chiquita HERO, CMA on 06/18/2024  8:22 AM.       Discussed the use of AI scribe software for clinical note transcription with the patient, who gave verbal consent to proceed.  History of Present Illness Mathew Martinez is a 89 year old male with hypertension who presents for blood pressure management.  His home blood pressure readings are consistently about five points lower than those taken in the clinic. He measures his blood pressure three times a week, typically in the morning and evening. He takes metoprolol  in the morning after breakfast and Cardura  in the evening after supper. He denies fatigue, dizziness upon changing positions, chest pain, shortness of breath, or palpitations.  He maintains a diet with three regular meals and three light snacks daily and has a good appetite. He consumes six to eight glasses of liquid daily.   A1c 5.7     06/18/2024    8:18 AM 03/30/2024   11:36 AM 10/11/2023   10:49 AM  Depression screen PHQ 2/9  Decreased Interest 0 0 0  Down, Depressed, Hopeless 0 0 0  PHQ - 2 Score 0 0 0  Altered sleeping 0 0   Tired, decreased energy 0 0   Change in appetite 0 0   Feeling bad or failure about yourself  0 0   Trouble concentrating 0 0   Moving slowly or fidgety/restless 0 0   Suicidal thoughts 0 0   PHQ-9 Score 0 0   Difficult doing work/chores  Not difficult at all        06/18/2024    8:18 AM 03/30/2024   11:36 AM 10/11/2023   10:49 AM  GAD 7 : Generalized Anxiety Score  Nervous, Anxious, on Edge 0 0 0  Control/stop worrying 0 0 0  Worry too much - different things 0 0 0  Trouble relaxing 0 0 0  Restless 0 0 0  Easily annoyed or irritable 0 0 0  Afraid - awful might happen 0 0 0  Total GAD 7 Score 0 0 0  Anxiety Difficulty  Not difficult at all Not difficult at all    Medications: Outpatient Medications Prior to Visit  Medication Sig   Calcium Carbonate-Vit D-Min (CALCIUM 1200 PO) Take 600 mg by mouth.   Cholecalciferol 25 MCG (1000 UT) tablet Take 1,000 Units by mouth daily.   co-enzyme Q-10 30 MG capsule Take 30 mg by mouth daily.   doxazosin  (CARDURA ) 4 MG tablet Take 1 tablet (4 mg total) by mouth daily.   loratadine  (CLARITIN ) 10 MG tablet Take 1 tablet (10 mg total) by mouth daily.   lovastatin  (MEVACOR ) 20 MG tablet Take 1 tablet (20 mg total) by mouth at bedtime.   metoprolol  succinate (TOPROL -XL) 25 MG 24 hr tablet TAKE 1 TABLET BY MOUTH DAILY.   Multiple Vitamins-Minerals (  MULTIVITAMIN WITH MINERALS) tablet Take 1 tablet by mouth daily.   Omega-3 Fatty Acids (FISH OIL) 500 MG CAPS Take 500 mg by mouth.   TURMERIC CURCUMIN PO Take 550 mg by mouth.   vitamin B-12 (CYANOCOBALAMIN ) 1000 MCG tablet Take 1 tablet (1,000 mcg total) by mouth daily.   [DISCONTINUED] ketoconazole  (NIZORAL ) 2 % cream Apply to the feet QHS   No facility-administered medications prior to visit.    Review of Systems  All other systems reviewed and are negative.  All negative Except see HPI       Objective    BP (!) 148/75 (BP Location: Left Arm, Patient Position: Sitting, Cuff Size: Normal)   Pulse 92   Temp 97.7 F (36.5 C) (Oral)   Ht 5' 9 (1.753 m)   Wt 141 lb 8 oz (64.2 kg)   SpO2 96%   BMI 20.90 kg/m     Physical Exam Vitals reviewed.  Constitutional:      General: He is not in acute distress.    Appearance: Normal appearance. He is  not diaphoretic.  HENT:     Head: Normocephalic and atraumatic.  Eyes:     General: No scleral icterus.    Conjunctiva/sclera: Conjunctivae normal.  Cardiovascular:     Rate and Rhythm: Normal rate and regular rhythm.     Pulses: Normal pulses.     Heart sounds: Normal heart sounds. No murmur heard. Pulmonary:     Effort: Pulmonary effort is normal. No respiratory distress.     Breath sounds: Normal breath sounds. No wheezing or rhonchi.  Musculoskeletal:     Cervical back: Neck supple.     Right lower leg: No edema.     Left lower leg: No edema.  Lymphadenopathy:     Cervical: No cervical adenopathy.  Skin:    General: Skin is warm and dry.     Findings: No rash.  Neurological:     Mental Status: He is alert and oriented to person, place, and time. Mental status is at baseline.  Psychiatric:        Mood and Affect: Mood normal.        Behavior: Behavior normal.      No results found for any visits on 06/18/24.      Assessment & Plan Essential hypertension Chronic and unstable Home blood pressure readings lower than clinic readings, indicating possible white coat syndrome. No symptoms reported. - Continue metoprolol  25 in the morning and Cardura  4 at night. - Monitor blood pressure at home three times a week, recording morning and evening readings. - Reassess blood pressure management at next visit. Will follow-up  Chronic anemia secondary to prior prostate cancer treatment Chronic anemia likely due to prior prostate cancer treatment with radiation and hormone therapy. No new symptoms reported. - Order blood work including complete blood count. - Monitor for symptoms of anemia such as fatigue or decreased appetite.  Prostate cancer, status post radiation and hormone therapy, under surveillance Prostate cancer treated with radiation and hormone therapy, currently under surveillance with no signs of recurrence. - Continue surveillance for prostate cancer recurrence. -  Monitor for urinary symptoms or other signs of recurrence.  Allergic rhinitis with chronic nasal congestion and postnasal drainage Chronic nasal congestion and postnasal drainage likely due to allergic rhinitis, managed with nasal saline rinse, Flonase, and antihistamines. - Continue using nasal saline rinse and Flonase as needed. - Prescribe new antihistamine similar to Zyrtec, 30 tablets with two refills. - Monitor effectiveness of  new antihistamine and adjust treatment as needed. Will follow-up  Essential hypertension (Primary)  - CBC with Differential/Platelet - Basic metabolic panel with GFR - Lipid panel  Other hyperlipidemia Chronic and stable Continue lovastatin  20mg  and lifestyle modifications ordered - CBC with Differential/Platelet - Basic metabolic panel with GFR - Lipid panel Will follow-up  Hyperglycemia Chronic and stable Continue lifestyle modifications ordered - CBC with Differential/Platelet - Basic metabolic panel with GFR - Lipid panel Will follow-up  Normocytic anemia - CBC with Differential/Platelet - Basic metabolic panel with GFR - Lipid panel  Non-seasonal allergic rhinitis due to other allergic trigger - levocetirizine (XYZAL ) 5 MG tablet; Take 1 tablet (5 mg total) by mouth every evening.  Dispense: 30 tablet; Refill: 2  General Health Maintenance Annual ophthalmology visits up to date. Dry eyes managed with eye drops. COVID and flu vaccinations planned for October. - Continue annual ophthalmology visits. - Use eye drops regularly for dry eyes. - Plan for COVID and flu vaccinations in October.  Follow-Up - Schedule follow-up appointment in 3-4 months. - Perform blood work at [employer], fasting if possible.  Essential hypertension (Primary)  - CBC with Differential/Platelet - Basic metabolic panel with GFR - Lipid panel  Other hyperlipidemia Chronic and stable on med Continue current regimen and update labs - CBC with  Differential/Platelet - Basic metabolic panel with GFR - Lipid panel Will follow-up  Hyperglycemia Chronic and stable Continue with lifestyle modifications - CBC with Differential/Platelet - Basic metabolic panel with GFR - Lipid panel Will follow-up  Normocytic anemia  - CBC with Differential/Platelet - Basic metabolic panel with GFR - Lipid panel  Non-seasonal allergic rhinitis due to other allergic trigger  - levocetirizine (XYZAL ) 5 MG tablet; Take 1 tablet (5 mg total) by mouth every evening.  Dispense: 30 tablet; Refill: 2   No orders of the defined types were placed in this encounter.   No follow-ups on file.   The patient was advised to call back or seek an in-person evaluation if the symptoms worsen or if the condition fails to improve as anticipated.  I discussed the assessment and treatment plan with the patient. The patient was provided an opportunity to ask questions and all were answered. The patient agreed with the plan and demonstrated an understanding of the instructions.  I, Sharran Caratachea, PA-C have reviewed all documentation for this visit. The documentation on 06/18/2024  for the exam, diagnosis, procedures, and orders are all accurate and complete.  Jolynn Spencer, [facility], MMS [facility] [phone] (phone) [phone] (fax)  [facility]

## 2024-06-18 ENCOUNTER — Ambulatory Visit (INDEPENDENT_AMBULATORY_CARE_PROVIDER_SITE_OTHER): Admitting: Physician Assistant

## 2024-06-18 ENCOUNTER — Encounter: Payer: Self-pay | Admitting: Physician Assistant

## 2024-06-18 VITALS — BP 148/75 | HR 92 | Temp 97.7°F | Ht 69.0 in | Wt 141.5 lb

## 2024-06-18 DIAGNOSIS — I10 Essential (primary) hypertension: Secondary | ICD-10-CM

## 2024-06-18 DIAGNOSIS — C61 Malignant neoplasm of prostate: Secondary | ICD-10-CM | POA: Diagnosis not present

## 2024-06-18 DIAGNOSIS — D649 Anemia, unspecified: Secondary | ICD-10-CM | POA: Diagnosis not present

## 2024-06-18 DIAGNOSIS — E7849 Other hyperlipidemia: Secondary | ICD-10-CM | POA: Diagnosis not present

## 2024-06-18 DIAGNOSIS — R739 Hyperglycemia, unspecified: Secondary | ICD-10-CM | POA: Diagnosis not present

## 2024-06-18 DIAGNOSIS — J3089 Other allergic rhinitis: Secondary | ICD-10-CM | POA: Diagnosis not present

## 2024-06-18 MED ORDER — LEVOCETIRIZINE DIHYDROCHLORIDE 5 MG PO TABS: 5.0000 mg | ORAL_TABLET | Freq: Every evening | ORAL | 2 refills | Status: DC

## 2024-06-19 ENCOUNTER — Other Ambulatory Visit: Payer: Self-pay | Admitting: Physician Assistant

## 2024-06-19 NOTE — Telephone Encounter (Signed)
 Requested Prescriptions  Pending Prescriptions Disp Refills   lovastatin  (MEVACOR ) 20 MG tablet [Pharmacy Med Name: LOVASTATIN  20 MG TABLET] 30 tablet 0    Sig: Take 1 tablet (20 mg total) by mouth at bedtime.     Cardiovascular:  Antilipid - Statins 2 Failed - 06/19/2024  3:24 PM      Failed - Lipid Panel in normal range within the last 12 months    Cholesterol  Date Value Ref Range Status  04/30/2022 136 <200 mg/dL Final   LDL Cholesterol (Calc)  Date Value Ref Range Status  04/30/2022 71 mg/dL (calc) Final    Comment:    Reference range: <100 . Desirable range <100 mg/dL for primary prevention;   <70 mg/dL for patients with CHD or diabetic patients  with > or = 2 CHD risk factors. SABRA LDL-C is now calculated using the Martin-Hopkins  calculation, which is a validated novel method providing  better accuracy than the Friedewald equation in the  estimation of LDL-C.  Gladis APPLETHWAITE et al. SANDREA. 7986;689(80): 2061-2068  (http://education.[employer].com/faq/FAQ164)    HDL  Date Value Ref Range Status  04/30/2022 45 > OR = 40 mg/dL Final   Triglycerides  Date Value Ref Range Status  04/30/2022 123 <150 mg/dL Final         Passed - Cr in normal range and within 360 days    Creat  Date Value Ref Range Status  04/30/2022 1.03 0.70 - 1.22 mg/dL Final   Creatinine, Ser  Date Value Ref Range Status  11/01/2023 0.93 0.61 - 1.24 mg/dL Final         Passed - Patient is not pregnant      Passed - Valid encounter within last 12 months    Recent Outpatient Visits           Yesterday Essential hypertension   [place] [facility] [place], [place], PA-C   2 months ago Essential hypertension   [place] [facility] [place], [place], PA-C   5 months ago Essential hypertension   [place] [facility] [place], [place], PA-C       Future Appointments             In 2 months Hester Alm BROCKS, MD [facility]

## 2024-06-24 ENCOUNTER — Encounter: Payer: Self-pay | Admitting: Physician Assistant

## 2024-06-24 DIAGNOSIS — I10 Essential (primary) hypertension: Secondary | ICD-10-CM | POA: Diagnosis not present

## 2024-06-24 DIAGNOSIS — R739 Hyperglycemia, unspecified: Secondary | ICD-10-CM | POA: Diagnosis not present

## 2024-06-24 DIAGNOSIS — D649 Anemia, unspecified: Secondary | ICD-10-CM | POA: Diagnosis not present

## 2024-06-24 DIAGNOSIS — E7849 Other hyperlipidemia: Secondary | ICD-10-CM | POA: Diagnosis not present

## 2024-06-25 ENCOUNTER — Ambulatory Visit: Payer: Self-pay | Admitting: Physician Assistant

## 2024-06-25 LAB — LIPID PANEL
Chol/HDL Ratio: 3 ratio (ref 0.0–5.0)
Cholesterol, Total: 152 mg/dL (ref 100–199)
HDL: 50 mg/dL (ref >39)
LDL Chol Calc (NIH): 85 mg/dL (ref 0–99)
Triglycerides: 91 mg/dL (ref 0–149)
VLDL Cholesterol Cal: 17 mg/dL (ref 5–40)

## 2024-06-25 LAB — CBC WITH DIFFERENTIAL/PLATELET
Basophils Absolute: 0 x10E3/uL (ref 0.0–0.2)
Basos: 0 %
EOS (ABSOLUTE): 0.2 x10E3/uL (ref 0.0–0.4)
Eos: 3 %
Hematocrit: 37.5 % (ref 37.5–51.0)
Hemoglobin: 12.4 g/dL — ABNORMAL LOW (ref 13.0–17.7)
Immature Grans (Abs): 0 x10E3/uL (ref 0.0–0.1)
Immature Granulocytes: 0 %
Lymphocytes Absolute: 1.3 x10E3/uL (ref 0.7–3.1)
Lymphs: 17 %
MCH: 32.1 pg (ref 26.6–33.0)
MCHC: 33.1 g/dL (ref 31.5–35.7)
MCV: 97 fL (ref 79–97)
Monocytes Absolute: 0.5 x10E3/uL (ref 0.1–0.9)
Monocytes: 7 %
Neutrophils Absolute: 5.3 x10E3/uL (ref 1.4–7.0)
Neutrophils: 73 %
Platelets: 220 x10E3/uL (ref 150–450)
RBC: 3.86 x10E6/uL — ABNORMAL LOW (ref 4.14–5.80)
RDW: 12.4 % (ref 11.6–15.4)
WBC: 7.3 x10E3/uL (ref 3.4–10.8)

## 2024-06-25 LAB — BASIC METABOLIC PANEL WITH GFR
BUN/Creatinine Ratio: 13 (ref 10–24)
BUN: 13 mg/dL (ref 10–36)
CO2: 20 mmol/L (ref 20–29)
Calcium: 9 mg/dL (ref 8.6–10.2)
Chloride: 103 mmol/L (ref 96–106)
Creatinine, Ser: 1.03 mg/dL (ref 0.76–1.27)
Glucose: 131 mg/dL — ABNORMAL HIGH (ref 70–99)
Potassium: 4.3 mmol/L (ref 3.5–5.2)
Sodium: 141 mmol/L (ref 134–144)
eGFR: 69 mL/min/1.73 (ref >59)

## 2024-07-22 ENCOUNTER — Other Ambulatory Visit: Payer: Self-pay | Admitting: Physician Assistant

## 2024-07-22 DIAGNOSIS — I10 Essential (primary) hypertension: Secondary | ICD-10-CM

## 2024-07-28 ENCOUNTER — Ambulatory Visit (INDEPENDENT_AMBULATORY_CARE_PROVIDER_SITE_OTHER)

## 2024-07-28 DIAGNOSIS — Z0000 Encounter for general adult medical examination without abnormal findings: Secondary | ICD-10-CM | POA: Diagnosis not present

## 2024-07-28 NOTE — Patient Instructions (Addendum)
 Mathew Martinez,  Thank you for taking the time for your Medicare Wellness Visit. I appreciate your continued commitment to your health goals. Please review the care plan we discussed, and feel free to reach out if I can assist you further.  Medicare recommends these wellness visits once per year to help you and your care team stay ahead of potential health issues. These visits are designed to focus on prevention, allowing your provider to concentrate on managing your acute and chronic conditions during your regular appointments.  Please note that Annual Wellness Visits do not include a physical exam. Some assessments may be limited, especially if the visit was conducted virtually. If needed, we may recommend a separate in-person follow-up with your provider.  Ongoing Care Seeing your primary care provider every 3 to 6 months helps us  monitor your health and provide consistent, personalized care.   Referrals If a referral was made during today's visit and you haven't received any updates within two weeks, please contact the referred provider directly to check on the status.  Recommended Screenings:  Health Maintenance  Topic Date Due   DTaP/Tdap/Td vaccine (1 - Tdap) Never done   Zoster (Shingles) Vaccine (1 of 2) Never done   Flu Shot  05/22/2024   COVID-19 Vaccine (8 - Pfizer risk 2024-25 season) 06/22/2024   Medicare Annual Wellness Visit  07/28/2025   Pneumococcal Vaccine for age over 41  Completed   Meningitis B Vaccine  Aged Out     Advance Care Planning is important because it: Ensures you receive medical care that aligns with your values, goals, and preferences. Provides guidance to your family and loved ones, reducing the emotional burden of decision-making during critical moments.  Vision: Annual vision screenings are recommended for early detection of glaucoma, cataracts, and diabetic retinopathy. These exams can also reveal signs of chronic conditions such as diabetes and high  blood pressure.  Dental: Annual dental screenings help detect early signs of oral cancer, gum disease, and other conditions linked to overall health, including heart disease and diabetes.  Please see the attached documents for additional preventive care recommendations.   NEXT AWV 08/03/25 @ 10:50 AM BY PHONE

## 2024-07-28 NOTE — Progress Notes (Signed)
 Subjective:   Mathew Martinez is a 89 y.o. who presents for a Medicare Wellness preventive visit.  As a reminder, Annual Wellness Visits don't include a physical exam, and some assessments may be limited, especially if this visit is performed virtually. We may recommend an in-person follow-up visit with your provider if needed.  Visit Complete: Virtual I connected with  Kirtis A Etsitty on 07/28/24 by a audio enabled telemedicine application and verified that I am speaking with the correct person using two identifiers.  Patient Location: Home  Provider Location: Office/Clinic  I discussed the limitations of evaluation and management by telemedicine. The patient expressed understanding and agreed to proceed.  Vital Signs: Because this visit was a virtual/telehealth visit, some criteria may be missing or patient reported. Any vitals not documented were not able to be obtained and vitals that have been documented are patient reported.  VideoDeclined- This patient declined Librarian, academic. Therefore the visit was completed with audio only.  Persons Participating in Visit: Patient.  AWV Questionnaire: No: Patient Medicare AWV questionnaire was not completed prior to this visit.  Cardiac Risk Factors include: advanced age (>17men, >40 women);dyslipidemia;hypertension;male gender     Objective:    There were no vitals filed for this visit. There is no height or weight on file to calculate BMI.     07/28/2024   11:36 AM 11/04/2023    1:09 PM 12/13/2022   10:32 AM 10/04/2021    3:24 PM 07/03/2021    1:00 PM 06/08/2021   11:02 AM 10/17/2020   10:30 AM  Advanced Directives  Does Patient Have a Medical Advance Directive? No No Yes No Yes No Yes  Type of Surveyor, minerals;Living will  Living will;Healthcare Power of [employer] Power of [place];Living will  Does patient want to make changes to medical advance  directive?   No - Patient declined      Copy of Healthcare Power of Attorney in Chart?   No - copy requested      Would patient like information on creating a medical advance directive? No - Patient declined   No - Patient declined  No - Patient declined     Current Medications (verified) Outpatient Encounter Medications as of 07/28/2024  Medication Sig   Calcium Carbonate-Vit D-Min (CALCIUM 1200 PO) Take 600 mg by mouth.   Cholecalciferol 25 MCG (1000 UT) tablet Take 1,000 Units by mouth daily.   co-enzyme Q-10 30 MG capsule Take 30 mg by mouth daily.   doxazosin  (CARDURA ) 4 MG tablet Take 1 tablet (4 mg total) by mouth daily.   levocetirizine (XYZAL ) 5 MG tablet Take 1 tablet (5 mg total) by mouth every evening.   lovastatin  (MEVACOR ) 20 MG tablet Take 1 tablet (20 mg total) by mouth at bedtime.   metoprolol  succinate (TOPROL -XL) 25 MG 24 hr tablet TAKE 1 TABLET BY MOUTH DAILY.   Multiple Vitamins-Minerals (MULTIVITAMIN WITH MINERALS) tablet Take 1 tablet by mouth daily.   Omega-3 Fatty Acids (FISH OIL) 500 MG CAPS Take 500 mg by mouth.   TURMERIC CURCUMIN PO Take 550 mg by mouth.   vitamin B-12 (CYANOCOBALAMIN ) 1000 MCG tablet Take 1 tablet (1,000 mcg total) by mouth daily.   No facility-administered encounter medications on file as of 07/28/2024.    Allergies (verified) Lisinopril   History: Past Medical History:  Diagnosis Date   Allergy    Arthritis    wrist   Basal cell carcinoma  02/25/2013   Left mastoid area. Excised, margins free.   Basal cell carcinoma 03/04/2013   Left posterior shoulder. Excised, margins free.   BPH (benign prostatic hyperplasia)    Dysplastic nevus 09/14/2013   Right upper back post. shoulder. Moderate to severe atypia, close to margin.   Dysplastic nevus 09/14/2013   Right sup. popliteal. Moderate atypia, lateral and deep margins involved.   Dysplastic nevus 09/14/2013   Right proximal med. calf. Moderate atypia, deep margin involved.     Dysplastic nevus 09/14/2013   Right distal med. calf. Moderate atypia, lateral and deep margins involved.    Hard of hearing    Heart murmur    History of basal cell carcinoma 02/18/2013   Right posterior wrist. Excised, margins free.   History of melanoma 01/14/2013   left upper back   Hyperlipemia    Hypertension    Melanoma (HCC) 01/14/2013   L upper back   Prostate cancer (HCC) 12/29/2019   Past Surgical History:  Procedure Laterality Date   APPENDECTOMY     CATARACT EXTRACTION W/PHACO Right 03/19/2018   Procedure: CATARACT EXTRACTION PHACO AND INTRAOCULAR LENS PLACEMENT (IOC) COMPLICATED RIGHT;  Surgeon: Mittie Gaskin, MD;  Location: [facility];  Service: Ophthalmology;  Laterality: Right;  MALYUGIN   CHOLECYSTECTOMY     COLONOSCOPY WITH PROPOFOL  N/A 07/15/2015   Procedure: COLONOSCOPY WITH PROPOFOL ;  Surgeon: Donnice Vaughn Manes, MD;  Location[facility];  Service: Endoscopy;  Laterality: N/A;   Duodenojejunostomy     EYE SURGERY     FRACTURE SURGERY     HERNIA REPAIR     PROSTATE SURGERY     prostate tissue removal  1996   VASECTOMY     Family History  Problem Relation Age of Onset   Heart disease Mother    Diabetes Mother    Cancer Father    Prostate cancer Brother    Heart attack Maternal Grandfather    Social History   Socioeconomic History   Marital status: Married    Spouse name: Not on file   Number of children: Not on file   Years of education: Not on file   Highest education level: Bachelor's degree (e.g., BA, AB, BS)  Occupational History   Occupation: retired  Tobacco Use   Smoking status: Former    Current packs/day: 0.00    Average packs/day: 0.5 packs/day for 50.0 years (25.0 ttl pk-yrs)    Types: Cigarettes    Start date: 12/28/1949    Quit date: 12/29/1999    Years since quitting: 24.5   Smokeless tobacco: Never  Vaping Use   Vaping status: Never Used  Substance and Sexual Activity   Alcohol use: Never   Drug use:  Never   Sexual activity: Not on file  Other Topics Concern   Not on file  Social History Narrative   Not on file   Social Drivers of Health   Financial Resource Strain: Low Risk  (07/28/2024)   Overall Financial Resource Strain (CARDIA)    Difficulty of Paying Living Expenses: Not hard at all  Food Insecurity: No Food Insecurity (07/28/2024)   Hunger Vital Sign    Worried About Running Out of Food in the Last Year: Never true    Ran Out of Food in the Last Year: Never true  Transportation Needs: No Transportation Needs (07/28/2024)   PRAPARE - Administrator, Civil Service (Medical): No    Lack of Transportation (Non-Medical): No  Physical Activity: Sufficiently Active (07/28/2024)  Exercise Vital Sign    Days of Exercise per Week: 7 days    Minutes of Exercise per Session: 40 min  Stress: No Stress Concern Present (07/28/2024)   [employer] of Occupational Health - Occupational Stress Questionnaire    Feeling of Stress: Not at all  Social Connections: Moderately Isolated (07/28/2024)   Social Connection and Isolation Panel    Frequency of Communication with Friends and Family: More than three times a week    Frequency of Social Gatherings with Friends and Family: Once a week    Attends Religious Services: Never    Database administrator or Organizations: No    Attends Engineer, structural: Never    Marital Status: Married    Tobacco Counseling Counseling given: Not Answered    Clinical Intake:  Pre-visit preparation completed: Yes  Pain : No/denies pain     Nutritional Status: BMI of 19-24  Normal Nutritional Risks: None Diabetes: No  Lab Results  Component Value Date   HGBA1C 5.7 (A) 12/24/2023     How often do you need to have someone help you when you read instructions, pamphlets, or other written materials from your doctor or pharmacy?: 1 - Never  Interpreter Needed?: No  Information entered by :: JHONNIE DAS,  LPN   Activities of Daily Living     07/28/2024   11:38 AM  In your present state of health, do you have any difficulty performing the following activities:  Hearing? 1  Vision? 0  Difficulty concentrating or making decisions? 1  Comment MEMORY  Walking or climbing stairs? 0  Dressing or bathing? 0  Doing errands, shopping? 0  Preparing Food and eating ? N  Using the Toilet? N  In the past six months, have you accidently leaked urine? N  Do you have problems with loss of bowel control? N  Managing your Medications? N  Managing your Finances? N  Housekeeping or managing your Housekeeping? N    Patient Care Team: Ostwalt, Janna, PA-C as PCP - General (Physician Assistant) Lenn Aran, MD as Radiation Oncologist (Radiation Oncology) Babara Call, MD as Consulting Physician (Oncology) Pa, [facility] (Optometry)  I have updated your Care Teams any recent Medical Services you may have received from other providers in the past year.     Assessment:   This is a routine wellness examination for Mathew Martinez.  Hearing/Vision screen Hearing Screening - Comments:: WEARS AIDS, BOTH EARS Vision Screening - Comments:: WEARS GLASSES ALL DAY- [facility]- GOES EVERY YEAR   Goals Addressed             This Visit's Progress    DIET - EAT MORE FRUITS AND VEGETABLES         Depression Screen     07/28/2024   11:34 AM 06/18/2024    8:18 AM 03/30/2024   11:36 AM 10/11/2023   10:49 AM 03/15/2023    3:05 PM 01/22/2022   11:12 AM 10/04/2021    3:24 PM  PHQ 2/9 Scores  PHQ - 2 Score 0 0 0 0 0 0 0  PHQ- 9 Score 0 0 0  0      Fall Risk     07/28/2024   11:37 AM 06/18/2024    8:18 AM 10/11/2023   10:49 AM 03/15/2023    3:05 PM 01/22/2022   11:11 AM  Fall Risk   Falls in the past year? 0 0 0 0 0  Number falls in past yr: 0 0  0 0 0  Injury with Fall? 0 0 0 0 0  Risk for fall due to : No Fall Risks No Fall Risks No Fall Risks No Fall Risks No Fall Risks  Follow up Falls evaluation  completed;Falls prevention discussed Falls evaluation completed Falls evaluation completed Falls evaluation completed Falls evaluation completed      Data saved with a previous flowsheet row definition    MEDICARE RISK AT HOME:  Medicare Risk at Home Any stairs in or around the home?: Yes If so, are there any without handrails?: No Home free of loose throw rugs in walkways, pet beds, electrical cords, etc?: Yes Adequate lighting in your home to reduce risk of falls?: Yes Life alert?: No Use of a cane, walker or w/c?: No Grab bars in the bathroom?: Yes Shower chair or bench in shower?: Yes Elevated toilet seat or a handicapped toilet?: No  TIMED UP AND GO:  Was the test performed?  No  Cognitive Function: 6CIT completed    10/04/2021    3:25 PM  MMSE - Mini Mental State Exam  Not completed: Unable to complete        07/28/2024   11:40 AM 10/04/2021    3:25 PM  6CIT Screen  What Year? 0 points 0 points  What month? 0 points 0 points  What time? 0 points 0 points  Count back from 20 0 points 0 points  Months in reverse 0 points 0 points  Repeat phrase 0 points 0 points  Total Score 0 points 0 points    Immunizations Immunization History  Administered Date(s) Administered   Fluad Quad(high Dose 65+) 08/06/2022   INFLUENZA, HIGH DOSE SEASONAL PF 07/30/2017   Influenza-Unspecified 08/08/2021, 07/18/2023   Moderna Covid-19 Fall Seasonal Vaccine 20yrs & older 09/10/2022, 07/18/2023   PFIZER(Purple Top)SARS-COV-2 Vaccination 10/28/2019, 11/21/2019, 08/02/2020, 03/14/2021, 08/08/2021   PNEUMOCOCCAL CONJUGATE-20 06/18/2023   Pneumococcal Polysaccharide-23 08/26/2015    Screening Tests Health Maintenance  Topic Date Due   DTaP/Tdap/Td (1 - Tdap) Never done   Zoster Vaccines- Shingrix (1 of 2) Never done   Influenza Vaccine  05/22/2024   COVID-19 Vaccine (8 - Pfizer risk 2024-25 season) 06/22/2024   Medicare Annual Wellness (AWV)  07/28/2025   Pneumococcal Vaccine:  50+ Years  Completed   Meningococcal B Vaccine  Aged Out    Health Maintenance Items Addressed: UP TO DATE ON COVID & PNA; NEEDS SHINGRIX & TDAP; AGED OUT OF COLONOSCOPY  Additional Screening:  Vision Screening: Recommended annual ophthalmology exams for early detection of glaucoma and other disorders of the eye. Is the patient up to date with their annual eye exam?  Yes  Who is the provider or what is the name of the office in which the patient attends annual eye exams? [facility]  Dental Screening: Recommended annual dental exams for proper oral hygiene  Community Resource Referral / Chronic Care Management: CRR required this visit?  No   CCM required this visit?  No   Plan:    I have personally reviewed and noted the following in the patient's chart:   Medical and social history Use of alcohol, tobacco or illicit drugs  Current medications and supplements including opioid prescriptions. Patient is not currently taking opioid prescriptions. Functional ability and status Nutritional status Physical activity Advanced directives List of other physicians Hospitalizations, surgeries, and ER visits in previous 12 months Vitals Screenings to include cognitive, depression, and falls Referrals and appointments  In addition, I have reviewed and discussed with patient  certain preventive protocols, quality metrics, and best practice recommendations. A written personalized care plan for preventive services as well as general preventive health recommendations were provided to patient.   Jhonnie GORMAN Das, LPN   89/11/7972   After Visit Summary: (MyChart) Due to this being a telephonic visit, the after visit summary with patients personalized plan was offered to patient via MyChart   Notes: Nothing significant to report at this time.

## 2024-08-03 ENCOUNTER — Other Ambulatory Visit: Payer: Self-pay | Admitting: Physician Assistant

## 2024-09-03 ENCOUNTER — Encounter: Payer: Self-pay | Admitting: Dermatology

## 2024-09-03 ENCOUNTER — Ambulatory Visit: Payer: Medicare HMO | Admitting: Dermatology

## 2024-09-03 DIAGNOSIS — S99921A Unspecified injury of right foot, initial encounter: Secondary | ICD-10-CM

## 2024-09-03 DIAGNOSIS — L578 Other skin changes due to chronic exposure to nonionizing radiation: Secondary | ICD-10-CM | POA: Diagnosis not present

## 2024-09-03 DIAGNOSIS — Z86018 Personal history of other benign neoplasm: Secondary | ICD-10-CM

## 2024-09-03 DIAGNOSIS — Z79899 Other long term (current) drug therapy: Secondary | ICD-10-CM

## 2024-09-03 DIAGNOSIS — Z85820 Personal history of malignant melanoma of skin: Secondary | ICD-10-CM

## 2024-09-03 DIAGNOSIS — L603 Nail dystrophy: Secondary | ICD-10-CM

## 2024-09-03 DIAGNOSIS — L820 Inflamed seborrheic keratosis: Secondary | ICD-10-CM | POA: Diagnosis not present

## 2024-09-03 DIAGNOSIS — L308 Other specified dermatitis: Secondary | ICD-10-CM

## 2024-09-03 DIAGNOSIS — D2261 Melanocytic nevi of right upper limb, including shoulder: Secondary | ICD-10-CM

## 2024-09-03 DIAGNOSIS — W908XXA Exposure to other nonionizing radiation, initial encounter: Secondary | ICD-10-CM

## 2024-09-03 DIAGNOSIS — L309 Dermatitis, unspecified: Secondary | ICD-10-CM

## 2024-09-03 DIAGNOSIS — L2089 Other atopic dermatitis: Secondary | ICD-10-CM

## 2024-09-03 DIAGNOSIS — Z85828 Personal history of other malignant neoplasm of skin: Secondary | ICD-10-CM

## 2024-09-03 DIAGNOSIS — Z1283 Encounter for screening for malignant neoplasm of skin: Secondary | ICD-10-CM

## 2024-09-03 DIAGNOSIS — L821 Other seborrheic keratosis: Secondary | ICD-10-CM | POA: Diagnosis not present

## 2024-09-03 DIAGNOSIS — L814 Other melanin hyperpigmentation: Secondary | ICD-10-CM

## 2024-09-03 DIAGNOSIS — D229 Melanocytic nevi, unspecified: Secondary | ICD-10-CM

## 2024-09-03 DIAGNOSIS — Z7189 Other specified counseling: Secondary | ICD-10-CM

## 2024-09-03 MED ORDER — TACROLIMUS 0.1 % EX OINT: TOPICAL_OINTMENT | CUTANEOUS | 6 refills | Status: AC

## 2024-09-03 NOTE — Patient Instructions (Addendum)
 For hand eczema  Start tacrolimus ointment apply twice daily to hands and fingers as needed    Seborrheic Keratosis  What causes seborrheic keratoses? Seborrheic keratoses are harmless, common skin growths that first appear during adult life.  As time goes by, more growths appear.  Some people may develop a large number of them.  Seborrheic keratoses appear on both covered and uncovered body parts.  They are not caused by sunlight.  The tendency to develop seborrheic keratoses can be inherited.  They vary in color from skin-colored to gray, brown, or even black.  They can be either smooth or have a rough, warty surface.   Seborrheic keratoses are superficial and look as if they were stuck on the skin.  Under the microscope this type of keratosis looks like layers upon layers of skin.  That is why at times the top layer may seem to fall off, but the rest of the growth remains and re-grows.    Treatment Seborrheic keratoses do not need to be treated, but can easily be removed in the office.  Seborrheic keratoses often cause symptoms when they rub on clothing or jewelry.  Lesions can be in the way of shaving.  If they become inflamed, they can cause itching, soreness, or burning.  Removal of a seborrheic keratosis can be accomplished by freezing, burning, or surgery. If any spot bleeds, scabs, or grows rapidly, please return to have it checked, as these can be an indication of a skin cancer.   Cryotherapy Aftercare  Wash gently with soap and water everyday.   Apply Vaseline and Band-Aid daily until healed.     Melanoma ABCDEs  Melanoma is the most dangerous type of skin cancer, and is the leading cause of death from skin disease.  You are more likely to develop melanoma if you: Have light-colored skin, light-colored eyes, or red or blond hair Spend a lot of time in the sun Tan regularly, either outdoors or in a tanning bed Have had blistering sunburns, especially during childhood Have a  close family member who has had a melanoma Have atypical moles or large birthmarks  Early detection of melanoma is key since treatment is typically straightforward and cure rates are extremely high if we catch it early.   The first sign of melanoma is often a change in a mole or a new dark spot.  The ABCDE system is a way of remembering the signs of melanoma.  A for asymmetry:  The two halves do not match. B for border:  The edges of the growth are irregular. C for color:  A mixture of colors are present instead of an even brown color. D for diameter:  Melanomas are usually (but not always) greater than 6mm - the size of a pencil eraser. E for evolution:  The spot keeps changing in size, shape, and color.  Please check your skin once per month between visits. You can use a small mirror in front and a large mirror behind you to keep an eye on the back side or your body.   If you see any new or changing lesions before your next follow-up, please call to schedule a visit.  Please continue daily skin protection including broad spectrum sunscreen SPF 30+ to sun-exposed areas, reapplying every 2 hours as needed when you're outdoors.   Staying in the shade or wearing long sleeves, sun glasses (UVA+UVB protection) and wide brim hats (4-inch brim around the entire circumference of the hat) are also recommended for  sun protection.    Due to recent changes in healthcare laws, you may see results of your pathology and/or laboratory studies on MyChart before the doctors have had a chance to review them. We understand that in some cases there may be results that are confusing or concerning to you. Please understand that not all results are received at the same time and often the doctors may need to interpret multiple results in order to provide you with the best plan of care or course of treatment. Therefore, we ask that you please give us  2 business days to thoroughly review all your results before  contacting the office for clarification. Should we see a critical lab result, you will be contacted sooner.   If You Need Anything After Your Visit  If you have any questions or concerns for your doctor, please call our main line at [phone] and press option 4 to reach your doctor's medical assistant. If no one answers, please leave a voicemail as directed and we will return your call as soon as possible. Messages left after 4 pm will be answered the following business day.   You may also send us  a message via MyChart. We typically respond to MyChart messages within 1-2 business days.  For prescription refills, please ask your pharmacy to contact our office. Our fax number is [phone].  If you have an urgent issue when the clinic is closed that cannot wait until the next business day, you can page your doctor at the number below.    Please note that while we do our best to be available for urgent issues outside of office hours, we are not available 24/7.   If you have an urgent issue and are unable to reach us , you may choose to seek medical care at your doctor's office, retail clinic, urgent care center, or emergency room.  If you have a medical emergency, please immediately call 911 or go to the emergency department.  Pager Numbers  - Dr. Hester: [phone]  - Dr. Jackquline: [phone]  - Dr. Claudene: [phone]   - Dr. Raymund: [phone]  In the event of inclement weather, please call our main line at [phone] for an update on the status of any delays or closures.  Dermatology Medication Tips: Please keep the boxes that topical medications come in in order to help keep track of the instructions about where and how to use these. Pharmacies typically print the medication instructions only on the boxes and not directly on the medication tubes.   If your medication is too expensive, please contact our office at [phone] option 4 or send us  a message through  MyChart.   We are unable to tell what your co-pay for medications will be in advance as this is different depending on your insurance coverage. However, we may be able to find a substitute medication at lower cost or fill out paperwork to get insurance to cover a needed medication.   If a prior authorization is required to get your medication covered by your insurance company, please allow us  1-2 business days to complete this process.  Drug prices often vary depending on where the prescription is filled and some pharmacies may offer cheaper prices.  The website www.goodrx.com contains coupons for medications through different pharmacies. The prices here do not account for what the cost may be with help from insurance (it may be cheaper with your insurance), but the website can give you the price if you did not use any insurance.  -  You can print the associated coupon and take it with your prescription to the pharmacy.  - You may also stop by our office during regular business hours and pick up a GoodRx coupon card.  - If you need your prescription sent electronically to a different pharmacy, notify our office through [facility] or by phone at [phone] option 4.     Si Usted Necesita Algo Despus de Su Visita  Tambin puede enviarnos un mensaje a travs de Clinical Cytogeneticist. Por lo general respondemos a los mensajes de MyChart en el transcurso de 1 a 2 das hbiles.  Para renovar recetas, por favor pida a su farmacia que se ponga en contacto con nuestra oficina. Randi lakes de fax es [place] [phone].  Si tiene un asunto urgente cuando la clnica est cerrada y que no puede esperar hasta el siguiente da hbil, puede llamar/localizar a su doctor(a) al nmero que aparece a continuacin.   Por favor, tenga en cuenta que aunque hacemos todo lo posible para estar disponibles para asuntos urgentes fuera del horario de [place], no estamos disponibles las 24 horas del da, los 7 [address]  [phone] de la  [place].   Si tiene un problema urgente y no puede comunicarse con nosotros, puede optar por buscar atencin mdica  en el consultorio de su doctor(a), en una clnica privada, en un centro de atencin urgente o en una sala de emergencias.  Si tiene engineer, drilling, por favor llame inmediatamente al 911 o vaya a la sala de emergencias.  Nmeros de bper  - Dr. Hester: [phone]  - Dra. Jackquline: [phone]  - Dr. Claudene: [phone]  - Dra. Kitts: [phone]  En caso de inclemencias del [place], por favor llame a nuestra lnea principal al [phone] para una actualizacin sobre el estado de cualquier retraso o cierre.  Consejos para la medicacin en dermatologa: Por favor, guarde las cajas en las que vienen los medicamentos de uso tpico para ayudarle a seguir las instrucciones sobre dnde y cmo usarlos. Las farmacias generalmente imprimen las instrucciones del medicamento slo en las cajas y no directamente en los tubos del [place].   Si su medicamento es muy caro, por favor, pngase en contacto con landry rieger llamando al [phone] y presione la opcin 4 o envenos un mensaje a travs de Clinical Cytogeneticist.   No podemos decirle cul ser su copago por los medicamentos por adelantado ya que esto es diferente dependiendo de la cobertura de su seguro. Sin embargo, es posible que podamos encontrar un medicamento sustituto a audiological scientist un formulario para que el seguro cubra el medicamento que se considera necesario.   Si se requiere una autorizacin previa para que su compaa de seguros cubra su medicamento, por favor permtanos de 1 a 2 das hbiles para completar este proceso.  Los precios de los medicamentos varan con frecuencia dependiendo del environmental consultant de dnde se surte la receta y alguna farmacias pueden ofrecer precios ms baratos.  El sitio web www.goodrx.com tiene cupones para medicamentos de health and safety inspector. Los precios aqu no tienen en cuenta lo que  podra costar con la ayuda del seguro (puede ser ms barato con su seguro), pero el sitio web puede darle el precio si no utiliz tourist information centre manager.  - Puede imprimir el cupn correspondiente y llevarlo con su receta a la farmacia.  - Tambin puede pasar por nuestra oficina durante el horario de atencin regular y education officer, museum una tarjeta de cupones de GoodRx.  - Si necesita que su receta se enve electrnicamente  a [place], informe a nuestra oficina a travs de MyChart de [place] o por telfono llamando al [phone] y presione la opcin 4.

## 2024-09-03 NOTE — Progress Notes (Signed)
 Follow-Up Visit   Subjective  Mathew Martinez is a 89 y.o. male who presents for the following: Skin Cancer Screening and Full Body Skin Exam Hx of melanoma, hx of bcc, hx of dsyplastic   Place on left index finger and prob middle toe on right foot  The patient presents for Total-Body Skin Exam (TBSE) for skin cancer screening and mole check. The patient has spots, moles and lesions to be evaluated, some may be new or changing and the patient may have concern these could be cancer.  The following portions of the chart were reviewed this encounter and updated as appropriate: medications, allergies, medical history  Review of Systems:  No other skin or systemic complaints except as noted in HPI or Assessment and Plan.  Objective  Well appearing patient in no apparent distress; mood and affect are within normal limits.  A full examination was performed including scalp, head, eyes, ears, nose, lips, neck, chest, axillae, abdomen, back, buttocks, bilateral upper extremities, bilateral lower extremities, hands, feet, fingers, toes, fingernails, and toenails. All findings within normal limits unless otherwise noted below.   Relevant physical exam findings are noted in the Assessment and Plan.  scalp x 1, left abdomen above waist x 1, right upper back x 1, right groin x 1 (4) Erythematous stuck-on, waxy papule or plaque  Assessment & Plan   HISTORY OF MELANOMA - 01/14/2013  L upper back  - No evidence of recurrence today - No lymphadenopathy - Recommend regular full body skin exams - Recommend daily broad spectrum sunscreen SPF 30+ to sun-exposed areas, reapply every 2 hours as needed.  - Call if any new or changing lesions are noted between office visits  HISTORY OF BASAL CELL CARCINOMA OF THE SKIN 02/18/2013 right posterior wrist  - No evidence of recurrence today - Recommend regular full body skin exams - Recommend daily broad spectrum sunscreen SPF 30+ to sun-exposed areas,  reapply every 2 hours as needed.  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  Call if any new or changing lesions are noted between office visits    SKIN CANCER SCREENING PERFORMED TODAY.  ACTINIC DAMAGE - Chronic condition, secondary to cumulative UV/sun exposure - diffuse scaly erythematous macules with underlying dyspigmentation - Recommend daily broad spectrum sunscreen SPF 30+ to sun-exposed areas, reapply every 2 hours as needed.  - Staying in the shade or wearing long sleeves, sun glasses (UVA+UVB protection) and wide brim hats (4-inch brim around the entire circumference of the hat) are also recommended for sun protection.  - Call for new or changing lesions.  LENTIGINES, SEBORRHEIC KERATOSES, HEMANGIOMAS - Benign normal skin lesions - Benign-appearing - Call for any changes  MELANOCYTIC NEVI - R post shoulder near the axilla, flesh colored papule.  - Tan-brown and/or pink-flesh-colored symmetric macules and papules - Benign appearing on exam today - Observation - Call clinic for new or changing moles - Recommend daily use of broad spectrum spf 30+ sunscreen to sun-exposed areas.   Trauma at right 3rd toenail With subungual blood Toenail thickening and dystrophy  Exam: see photos Blood observed under proximal 3rd toenail of right foot No  hutchinson signs  Treatment Plan: Benign. Observe Do not recommended any treatment Discussed could take up to 12 months to heal  Reassured.  Check next visit  Opened comedones on face  HAND DERMATITIS Exam Peeling scale at hands and fingers at finger and hands   Chronic and persistent condition with duration or expected duration over one year. Condition is symptomatic/ bothersome to patient. Not currently at goal. Hand Dermatitis is a chronic type of eczema that can come and go on the hands and fingers.  While there is no cure, the rash and symptoms can be managed with topical prescription medications, and for more severe cases, with systemic medications.  Recommend mild soap and routine use of moisturizing cream after handwashing.  Minimize soap/water exposure when possible.   Treatment Plan Start tacrolimus ointment apply twice daily to affected areas daily as needed for hand eczema  Recommend mild soap and moisturizing cream with hand washing.   INFLAMED SEBORRHEIC KERATOSIS (4) scalp x 1, left abdomen above waist x 1, right upper back x 1, right groin x 1 (4) Symptomatic, irritating, patient would like treated. Destruction of lesion - scalp x 1, left abdomen above waist x 1, right upper back x 1, right groin x 1 (4) Complexity: simple   Destruction method: cryotherapy   Informed consent: discussed and consent obtained   Timeout:  patient name, date of birth, surgical site, and procedure verified Lesion destroyed using liquid nitrogen: Yes   Region frozen until ice ball extended beyond lesion: Yes   Outcome: patient tolerated procedure well with no complications   Post-procedure details: wound care instructions given    HAND DERMATITIS   Related Medications tacrolimus (PROTOPIC) 0.1 % ointment Apply topically to hands and fingers twice daily as needed for hand eczema Return in about 1 year (around 09/03/2025) for TBSE.  IEleanor Blush, CMA, am acting as scribe for Alm Rhyme, MD.   Documentation: I have reviewed the above documentation for accuracy and completeness, and I agree with the above.  Alm Rhyme, MD

## 2024-09-23 ENCOUNTER — Other Ambulatory Visit: Payer: Self-pay | Admitting: Physician Assistant

## 2024-09-23 DIAGNOSIS — J3089 Other allergic rhinitis: Secondary | ICD-10-CM

## 2024-10-08 ENCOUNTER — Ambulatory Visit (INDEPENDENT_AMBULATORY_CARE_PROVIDER_SITE_OTHER): Admitting: Physician Assistant

## 2024-10-08 ENCOUNTER — Encounter: Payer: Self-pay | Admitting: Physician Assistant

## 2024-10-08 VITALS — BP 124/53 | HR 85 | Resp 14 | Ht 69.0 in | Wt 144.2 lb

## 2024-10-08 DIAGNOSIS — E7849 Other hyperlipidemia: Secondary | ICD-10-CM

## 2024-10-08 DIAGNOSIS — J3089 Other allergic rhinitis: Secondary | ICD-10-CM | POA: Diagnosis not present

## 2024-10-08 DIAGNOSIS — I10 Essential (primary) hypertension: Secondary | ICD-10-CM

## 2024-10-08 DIAGNOSIS — N39498 Other specified urinary incontinence: Secondary | ICD-10-CM

## 2024-10-08 DIAGNOSIS — J301 Allergic rhinitis due to pollen: Secondary | ICD-10-CM

## 2024-10-08 NOTE — Progress Notes (Unsigned)
 Established patient visit  Patient: Mathew Martinez   DOB: 06/17/1931   89 y.o. Male  MRN: [id number] Visit Date: 10/08/2024  Today's healthcare provider: Jolynn Spencer, PA-C   Chief Complaint  Patient presents with   Medical Management of Chronic Issues    4 month f/u Would like to talk regarding levocetirizine- reports its not really helping    Subjective     HPI     Medical Management of Chronic Issues    Additional comments: 4 month f/u Would like to talk regarding levocetirizine- reports its not really helping       Last edited by Wilfred Hargis RAMAN, CMA on 10/08/2024  8:53 AM.       Discussed the use of AI scribe software for clinical note transcription with the patient, who gave verbal consent to proceed.  History of Present Illness Mathew Martinez is a 89 year old male who presents for a routine follow-up visit.  He takes metoprolol  in the morning and doxazosin  at night for blood pressure. BP on arrival was 143 systolic and decreased to 124 systolic after a short rest. His diastolic BP usually runs 50 to 60. He denies chest pain, shortness of breath, or palpitations.  He takes Xyzal  (levocetirizine) 5 mL for congestion, plus Flonase once daily and saline spray two to three times daily. Symptoms have improved but are still not fully relieved.  He has urinary leakage that he attributes to nerve damage from prior radiation. A previous urology medication helped for about two months, then caused bothersome urinary frequency, so he stopped it.  He takes lovastatin  daily, along with vitamin D and calcium supplements, and asks if he should increase vitamin D.  He will be 89 in April and remains physically active, including mowing his lawn with a walk-behind mower. He feels well overall and limits sweets, only occasionally eating a cookie or dessert.       07/28/2024   11:34 AM 06/18/2024    8:18 AM 03/30/2024   11:36 AM  Depression screen PHQ 2/9  Decreased Interest 0 0  0  Down, Depressed, Hopeless 0 0 0  PHQ - 2 Score 0 0 0  Altered sleeping 0 0 0  Tired, decreased energy 0 0 0  Change in appetite 0 0 0  Feeling bad or failure about yourself  0 0 0  Trouble concentrating 0 0 0  Moving slowly or fidgety/restless 0 0 0  Suicidal thoughts 0 0 0  PHQ-9 Score 0  0  0   Difficult doing work/chores   Not difficult at all     Data saved with a previous flowsheet row definition      06/18/2024    8:18 AM 03/30/2024   11:36 AM 10/11/2023   10:49 AM  GAD 7 : Generalized Anxiety Score  Nervous, Anxious, on Edge 0 0 0  Control/stop worrying 0 0 0  Worry too much - different things 0 0 0  Trouble relaxing 0 0 0  Restless 0 0 0  Easily annoyed or irritable 0 0 0  Afraid - awful might happen 0 0 0  Total GAD 7 Score 0 0 0  Anxiety Difficulty  Not difficult at all Not difficult at all    Medications: Show/hide medication list[1]  Review of Systems All negative Except see HPI   {Insert previous labs (optional):23779} {See past labs  Heme  Chem  Endocrine  Serology  Results Review (optional):1}   Objective    BP ROLLEN)  124/53   Pulse 85   Resp 14   Ht 5' 9 (1.753 m)   Wt 144 lb 3.2 oz (65.4 kg)   SpO2 97%   BMI 21.29 kg/m  {Insert last BP/Wt (optional):23777}{See vitals history (optional):1}   Physical Exam Vitals reviewed.  Constitutional:      General: He is not in acute distress.    Appearance: Normal appearance. He is not diaphoretic.  HENT:     Head: Normocephalic and atraumatic.  Eyes:     General: No scleral icterus.    Conjunctiva/sclera: Conjunctivae normal.  Cardiovascular:     Rate and Rhythm: Normal rate and regular rhythm.     Pulses: Normal pulses.     Heart sounds: Normal heart sounds. No murmur heard. Pulmonary:     Effort: Pulmonary effort is normal. No respiratory distress.     Breath sounds: Normal breath sounds. No wheezing or rhonchi.  Musculoskeletal:     Cervical back: Neck supple.     Right lower leg: No  edema.     Left lower leg: No edema.  Lymphadenopathy:     Cervical: No cervical adenopathy.  Skin:    General: Skin is warm and dry.     Findings: No rash.  Neurological:     Mental Status: He is alert and oriented to person, place, and time. Mental status is at baseline.  Psychiatric:        Mood and Affect: Mood normal.        Behavior: Behavior normal.      No results found for any visits on 10/08/24.      Assessment & Plan Hypertension Chronic and stable Blood pressure controlled with metoprolol  and doxazosin . Diastolic pressure low, possibly due to low RBC count. - Reduce doxazosin  to half a tablet to assess impact on blood pressure. Will follow-up  Allergic rhinitis Symptoms partially controlled with levocetirizine, Flonase, and saline spray. No need for ENT or allergy referral unless symptoms worsen. - Continue levocetirizine, Flonase, and saline spray. - Consider ENT or allergy referral if symptoms worsen.  Hyperlipidemia Chronic and stable Cholesterol levels well-managed with lovastatin . - Continue lovastatin . Continue low cholesterol diet and regular exercise Will follow-up  Urinary incontinence following prostate cancer treatment Mild incontinence persists post-radiation. Previous medication effective but increased frequency. Condition adapted.  General Health Maintenance Routine health maintenance up to date. Vitamin D and calcium supplementation ongoing. - Continue routine health maintenance visits. - Check vitamin D levels during next lab work.    No orders of the defined types were placed in this encounter.   No follow-ups on file.   The patient was advised to call back or seek an in-person evaluation if the symptoms worsen or if the condition fails to improve as anticipated.  I discussed the assessment and treatment plan with the patient. The patient was provided an opportunity to ask questions and all were answered. The patient agreed with the  plan and demonstrated an understanding of the instructions.  I, Yuleni Burich, PA-C have reviewed all documentation for this visit. The documentation on 10/08/2024  for the exam, diagnosis, procedures, and orders are all accurate and complete.  Jolynn Spencer, [facility], MMS [facility] [phone] (phone) [phone] (fax)  [facility]     [1]  Outpatient Medications Prior to Visit  Medication Sig   Calcium Carbonate-Vit D-Min (CALCIUM 1200 PO) Take 600 mg by mouth.   co-enzyme Q-10 30 MG capsule Take 30 mg by mouth daily.  doxazosin  (CARDURA ) 4 MG tablet Take 1 tablet (4 mg total) by mouth daily.   levocetirizine (XYZAL ) 5 MG tablet Take 1 tablet (5 mg total) by mouth every evening.   lovastatin  (MEVACOR ) 20 MG tablet Take 1 tablet (20 mg total) by mouth at bedtime.   metoprolol  succinate (TOPROL -XL) 25 MG 24 hr tablet TAKE 1 TABLET BY MOUTH DAILY.   Multiple Vitamins-Minerals (MULTIVITAMIN WITH MINERALS) tablet Take 1 tablet by mouth daily.   Omega-3 Fatty Acids (FISH OIL) 500 MG CAPS Take 500 mg by mouth.   tacrolimus  (PROTOPIC ) 0.1 % ointment Apply topically to hands and fingers twice daily as needed for hand eczema   TURMERIC CURCUMIN PO Take 550 mg by mouth.   vitamin B-12 (CYANOCOBALAMIN ) 1000 MCG tablet Take 1 tablet (1,000 mcg total) by mouth daily.   [DISCONTINUED] Cholecalciferol 25 MCG (1000 UT) tablet Take 1,000 Units by mouth daily.   No facility-administered medications prior to visit.

## 2024-10-09 DIAGNOSIS — J309 Allergic rhinitis, unspecified: Secondary | ICD-10-CM | POA: Insufficient documentation

## 2024-10-09 MED ORDER — LEVOCETIRIZINE DIHYDROCHLORIDE 5 MG PO TABS: 5.0000 mg | ORAL_TABLET | Freq: Every evening | ORAL | 1 refills | Status: AC

## 2024-12-10 ENCOUNTER — Ambulatory Visit: Admitting: Physician Assistant

## 2025-08-03 ENCOUNTER — Ambulatory Visit

## 2025-09-02 ENCOUNTER — Ambulatory Visit: Admitting: Dermatology
